# Patient Record
Sex: Female | Born: 1995 | Race: Black or African American | Hispanic: No | Marital: Single | Smoking: Former smoker
Health system: Southern US, Community
[De-identification: ages and names within clinical notes are randomized; demographics above are authoritative.]

## PROBLEM LIST (undated history)

## (undated) ENCOUNTER — Inpatient Hospital Stay (HOSPITAL_COMMUNITY): Payer: Self-pay

## (undated) DIAGNOSIS — F32A Depression, unspecified: Secondary | ICD-10-CM

## (undated) DIAGNOSIS — F909 Attention-deficit hyperactivity disorder, unspecified type: Secondary | ICD-10-CM

## (undated) DIAGNOSIS — O1490 Unspecified pre-eclampsia, unspecified trimester: Secondary | ICD-10-CM

## (undated) DIAGNOSIS — Z789 Other specified health status: Secondary | ICD-10-CM

## (undated) DIAGNOSIS — F419 Anxiety disorder, unspecified: Secondary | ICD-10-CM

## (undated) DIAGNOSIS — B009 Herpesviral infection, unspecified: Secondary | ICD-10-CM

## (undated) HISTORY — DX: Attention-deficit hyperactivity disorder, unspecified type: F90.9

## (undated) HISTORY — DX: Herpesviral infection, unspecified: B00.9

## (undated) HISTORY — DX: Depression, unspecified: F32.A

## (undated) HISTORY — PX: WISDOM TOOTH EXTRACTION: SHX21

## (undated) HISTORY — DX: Anxiety disorder, unspecified: F41.9

---
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
## 2017-02-07 ENCOUNTER — Emergency Department (HOSPITAL_COMMUNITY)
Admission: EM | Admit: 2017-02-07 | Discharge: 2017-02-07 | Disposition: A | Discharge status: Home / Self Care | Payer: Medicaid Other | Attending: Emergency Medicine | Admitting: Emergency Medicine

## 2017-02-07 ENCOUNTER — Encounter (HOSPITAL_COMMUNITY): Payer: Self-pay | Admitting: Emergency Medicine

## 2017-02-07 DIAGNOSIS — R112 Nausea with vomiting, unspecified: Secondary | ICD-10-CM | POA: Diagnosis present

## 2017-02-07 DIAGNOSIS — R197 Diarrhea, unspecified: Secondary | ICD-10-CM | POA: Diagnosis not present

## 2017-02-07 DIAGNOSIS — R1033 Periumbilical pain: Secondary | ICD-10-CM | POA: Insufficient documentation

## 2017-02-07 DIAGNOSIS — Z79899 Other long term (current) drug therapy: Secondary | ICD-10-CM | POA: Insufficient documentation

## 2017-02-07 LAB — CBC WITH DIFFERENTIAL/PLATELET
BASOS ABS: 0 10*3/uL (ref 0.0–0.1)
BASOS PCT: 0 %
Eosinophils Absolute: 0.1 10*3/uL (ref 0.0–0.7)
Eosinophils Relative: 1 %
HCT: 39.5 % (ref 36.0–46.0)
HEMOGLOBIN: 12.8 g/dL (ref 12.0–15.0)
LYMPHS ABS: 1.8 10*3/uL (ref 0.7–4.0)
Lymphocytes Relative: 21 %
MCH: 28.7 pg (ref 26.0–34.0)
MCHC: 32.4 g/dL (ref 30.0–36.0)
MCV: 88.6 fL (ref 78.0–100.0)
Monocytes Absolute: 0.4 10*3/uL (ref 0.1–1.0)
Monocytes Relative: 5 %
NEUTROS PCT: 73 %
Neutro Abs: 6.2 10*3/uL (ref 1.7–7.7)
Platelets: 436 10*3/uL — ABNORMAL HIGH (ref 150–400)
RBC: 4.46 MIL/uL (ref 3.87–5.11)
RDW: 13.6 % (ref 11.5–15.5)
WBC: 8.5 10*3/uL (ref 4.0–10.5)

## 2017-02-07 LAB — URINALYSIS, ROUTINE W REFLEX MICROSCOPIC
BILIRUBIN URINE: NEGATIVE
Glucose, UA: NEGATIVE mg/dL
HGB URINE DIPSTICK: NEGATIVE
Ketones, ur: NEGATIVE mg/dL
Leukocytes, UA: NEGATIVE
Nitrite: NEGATIVE
PROTEIN: NEGATIVE mg/dL
Specific Gravity, Urine: 1.025 (ref 1.005–1.030)
pH: 6.5 (ref 5.0–8.0)

## 2017-02-07 LAB — COMPREHENSIVE METABOLIC PANEL
ALBUMIN: 4.1 g/dL (ref 3.5–5.0)
ALK PHOS: 79 U/L (ref 38–126)
ALT: 24 U/L (ref 14–54)
AST: 21 U/L (ref 15–41)
Anion gap: 10 (ref 5–15)
BUN: 13 mg/dL (ref 6–20)
CALCIUM: 9.4 mg/dL (ref 8.9–10.3)
CHLORIDE: 103 mmol/L (ref 101–111)
CO2: 26 mmol/L (ref 22–32)
CREATININE: 0.66 mg/dL (ref 0.44–1.00)
GFR calc Af Amer: >60 mL/min (ref >60)
GFR calc non Af Amer: >60 mL/min (ref >60)
GLUCOSE: 100 mg/dL — AB (ref 65–99)
Potassium: 3.7 mmol/L (ref 3.5–5.1)
SODIUM: 139 mmol/L (ref 135–145)
Total Bilirubin: 0.3 mg/dL (ref 0.3–1.2)
Total Protein: 8.7 g/dL — ABNORMAL HIGH (ref 6.5–8.1)

## 2017-02-07 LAB — LIPASE, BLOOD: LIPASE: 23 U/L (ref 11–51)

## 2017-02-07 LAB — PREGNANCY, URINE: PREG TEST UR: NEGATIVE

## 2017-02-07 MED ORDER — ONDANSETRON 8 MG PO TBDP: 8.0000 mg | ORAL_TABLET | Freq: Three times a day (TID) | ORAL | 0 refills | Status: DC | PRN

## 2017-02-07 MED ORDER — SODIUM CHLORIDE 0.9 % IV BOLUS (SEPSIS)
1000.0000 mL | Freq: Once | INTRAVENOUS | Status: AC
  Administered 2017-02-07: 1000 mL via INTRAVENOUS

## 2017-02-07 MED ORDER — ONDANSETRON HCL 4 MG/2ML IJ SOLN
4.0000 mg | Freq: Once | INTRAMUSCULAR | Status: AC
  Administered 2017-02-07: 4 mg via INTRAVENOUS
  Filled 2017-02-07: qty 2

## 2017-02-07 NOTE — ED Triage Notes (Signed)
Onset yesterday abdominal pain,  N/V/D continues today

## 2017-02-07 NOTE — Discharge Instructions (Signed)
As discussed I suspect you may have a case of food poisoning.  Rest, make sure you are drinking plenty of fluids.  Consider the b.r.a.t diet until feeling better (bananas, rice, applesauce, toast).  Take zofran if needed for continued nausea or vomiting.  I would let the diarrhea run its course since it seems to be slowing down (don't take any diarrhea medicine).

## 2017-02-07 NOTE — ED Provider Notes (Signed)
[facility]-EMERGENCY DEPT Provider Note   CSN: [id number] Arrival date & time: 02/07/17  0859     History   Chief Complaint Chief Complaint  Patient presents with  . Emesis    HPI Donna Conrad is a [age] y.o. female presenting with a 3 hour history of nausea, vomiting and diarrhea.  She reports back to back episodes of vomiting, approximately 6 prior to arrival and 2 since here and almost the same amount of diarrhea, nonbloody.  She denies fevers or chills, does have periumbilical cramping pain with improves briefly between vomiting episodes.  She has had no sick contacts but ate a [employer] yesterday, food tasted good, but then reheated the leftovers for supper last night at which time it tasted funny.  She has had no treatment prior to arrival.  The history is provided by the patient.    Past Medical History:  Diagnosis Date  . Medical history non-contributory   . Pre-eclampsia     Patient Active Problem List   Diagnosis Date Noted  . [redacted] weeks gestation of pregnancy   . Preeclampsia 11/17/2014    Past Surgical History:  Procedure Laterality Date  . CESAREAN SECTION N/A 11/19/2014   Procedure: CESAREAN SECTION;  Surgeon: Sherian ReinJody Bovard-Stuckert, MD;  Location: [facility];  Service: Obstetrics;  Laterality: N/A;  . WISDOM TOOTH EXTRACTION      OB History    Gravida Para Term Preterm AB Living   1 1   1   1    SAB TAB Ectopic Multiple Live Births         0 1       Home Medications    Prior to Admission medications   Medication Sig Start Date End Date Taking? Authorizing Provider  etonogestrel (NEXPLANON) 68 MG IMPL implant Nexplanon 68 mg subdermal implant  Inject 1 implant by subcutaneous route.   Yes [provider]  valACYclovir (VALTREX) 500 MG tablet Take 1 tablet by mouth 2 (two) times daily as needed (outbreaks).  02/01/17  Yes [provider]  ibuprofen (ADVIL,MOTRIN) 600 MG tablet Take 1 tablet (600 mg total) by mouth every 6 (six) hours as  needed. Patient not taking: Reported on 02/07/2017 10/23/16   Burgess AmorIdol, Danelle Curiale, PA-C  ondansetron (ZOFRAN ODT) 8 MG disintegrating tablet Take 1 tablet (8 mg total) by mouth every 8 (eight) hours as needed for nausea or vomiting. 02/07/17   Burgess AmorIdol, Timur Nibert, PA-C    Family History Family History  Problem Relation Age of Onset  . Asthma Mother   . Diabetes Mother   . Hypertension Mother   . Miscarriages / [place] Mother   . Stroke Mother   . Asthma Father   . Depression Father   . Stroke Father   . Diabetes Sister   . Mental illness Sister   . Miscarriages / Stillbirths Sister   . Diabetes Brother     Social History Social History  Substance Use Topics  . Smoking status: Never Smoker  . Smokeless tobacco: Never Used  . Alcohol use No     Allergies   Patient has no known allergies.   Review of Systems Review of Systems  Constitutional: Negative for fever.  HENT: Negative for congestion and sore throat.   Eyes: Negative.   Respiratory: Negative for chest tightness and shortness of breath.   Cardiovascular: Negative for chest pain.  Gastrointestinal: Positive for abdominal pain, diarrhea, nausea and vomiting.  Genitourinary: Negative.   Musculoskeletal: Negative for arthralgias, joint swelling and neck pain.  Skin: Negative.  Negative for rash and wound.  Neurological: Negative for dizziness, weakness, light-headedness, numbness and headaches.  Psychiatric/Behavioral: Negative.      Physical Exam Updated Vital Signs BP 121/82   Pulse 67   Temp 97.8 F (36.6 C) (Oral)   Resp 16   Ht 5\' 3"  (1.6 m)   Wt 79.8 kg (176 lb)   SpO2 100%   BMI 31.18 kg/m   Physical Exam  Constitutional: She appears well-developed and well-nourished.  HENT:  Head: Normocephalic and atraumatic.  Mouth/Throat: Mucous membranes are normal. Mucous membranes are not dry.  Eyes: Conjunctivae are normal.  Neck: Normal range of motion.  Cardiovascular: Normal rate, regular rhythm, normal heart  sounds and intact distal pulses.   Pulmonary/Chest: Effort normal and breath sounds normal. She has no wheezes.  Abdominal: Soft. Bowel sounds are normal. There is tenderness in the periumbilical area. There is no rigidity and no guarding.  Musculoskeletal: Normal range of motion.  Neurological: She is alert.  Skin: Skin is warm and dry.  Psychiatric: She has a normal mood and affect.  Nursing note and vitals reviewed.    ED Treatments / Results  Labs (all labs ordered are listed, but only abnormal results are displayed) Labs Reviewed  CBC WITH DIFFERENTIAL/PLATELET - Abnormal; Notable for the following:       Result Value   Platelets 436 (*)    All other components within normal limits  COMPREHENSIVE METABOLIC PANEL - Abnormal; Notable for the following:    Glucose, Bld 100 (*)    Total Protein 8.7 (*)    All other components within normal limits  URINALYSIS, ROUTINE W REFLEX MICROSCOPIC - Abnormal; Notable for the following:    APPearance HAZY (*)    All other components within normal limits  LIPASE, BLOOD  PREGNANCY, URINE    EKG  EKG Interpretation None       Radiology No results found.  Procedures Procedures (including critical care time)  Medications Ordered in ED Medications  ondansetron (ZOFRAN) injection 4 mg (4 mg Intravenous Given 02/07/17 1033)  sodium chloride 0.9 % bolus 1,000 mL (0 mLs Intravenous Stopped 02/07/17 1149)     Initial Impression / Assessment and Plan / ED Course  I have reviewed the triage vital signs and the nursing notes.  Pertinent labs & imaging results that were available during my care of the patient were reviewed by me and considered in my medical decision making (see chart for details).     Patient felt improved after IV fluids and Zofran given.  Her diarrhea has greatly slowed up since first arrival in the ED.  Suspect probable food poisoning.  Discussed Genelle Bal diet, Zofran prescribed, increase fluid intake.  Follow-up for any  worsening symptoms.  Final Clinical Impressions(s) / ED Diagnoses   Final diagnoses:  Nausea vomiting and diarrhea    New Prescriptions New Prescriptions   ONDANSETRON (ZOFRAN ODT) 8 MG DISINTEGRATING TABLET    Take 1 tablet (8 mg total) by mouth every 8 (eight) hours as needed for nausea or vomiting.     Burgess Amor, PA-C 02/07/17 1232    Bethann Berkshire, MD 02/08/17 [phone]

## 2017-11-25 ENCOUNTER — Encounter (HOSPITAL_COMMUNITY): Payer: Self-pay | Admitting: Emergency Medicine

## 2017-11-25 ENCOUNTER — Emergency Department (HOSPITAL_COMMUNITY)
Admission: EM | Admit: 2017-11-25 | Discharge: 2017-11-25 | Disposition: A | Discharge status: Home / Self Care | Payer: Medicaid Other | Attending: Emergency Medicine | Admitting: Emergency Medicine

## 2017-11-25 ENCOUNTER — Other Ambulatory Visit: Payer: Self-pay

## 2017-11-25 DIAGNOSIS — Z79899 Other long term (current) drug therapy: Secondary | ICD-10-CM | POA: Insufficient documentation

## 2017-11-25 DIAGNOSIS — J029 Acute pharyngitis, unspecified: Secondary | ICD-10-CM | POA: Insufficient documentation

## 2017-11-25 LAB — GROUP A STREP BY PCR: GROUP A STREP BY PCR: NOT DETECTED

## 2017-11-25 MED ORDER — FLUTICASONE PROPIONATE 50 MCG/ACT NA SUSP: 2.0000 | Freq: Every day | NASAL | 0 refills | Status: DC

## 2017-11-25 NOTE — Discharge Instructions (Addendum)
You may take Tylenol and Motrin to help with your sore throat.  You were also given a perception for Flonase to help with nasal congestion and ear fullness.  To help soothe a sore throat gargle with warm salt water.  Make salt water by dissolving  teaspoon salt in 1 cup warm water. You may also use throat lozenges and over the counter sore throat spray.  Please follow up with your primary care provider within 5-7 days for re-evaluation of your symptoms. If you do not have a primary care provider, information for a healthcare clinic has been provided for you to make arrangements for follow up care. Please return to the emergency department for any persistent fevers, worsening sore throat/hoarse voice, inability to swallow, persistent vomiting, chest pain, shortness of breath, coughing up blood, or any new or worsening symptoms.

## 2017-11-25 NOTE — ED Provider Notes (Signed)
[name] EMERGENCY DEPARTMENT Provider Note   CSN: [id number] Arrival date & time: 11/25/17  1035     History   Chief Complaint Chief Complaint  Patient presents with  . Sore Throat    HPI Donna Conrad is a 22 y.o. female.  HPI  Patient is a 22 year old female who presents the emergency department today to be evaluated for sore throat that has been intermittent for the last 2 weeks.  States that over the last several days become more constant and is getting worse.  She denies any fevers at home.  She is also reporting some left ear pain.  States it feels like it is full of fluid.  She denies any drainage.  Has had some congestion, no rhinorrhea, cough, shortness of breath abdominal pain nausea vomiting or urinary symptoms.  States that she was recently around her child who has had a cold.  No difficulty drinking liquids or eating at home.  Has not tried any interventions for her symptoms.  Denies any changes to her voice.  Past Medical History:  Diagnosis Date  . Medical history non-contributory   . Pre-eclampsia     Patient Active Problem List   Diagnosis Date Noted  . [redacted] weeks gestation of pregnancy   . Preeclampsia 11/17/2014    Past Surgical History:  Procedure Laterality Date  . CESAREAN SECTION N/A 11/19/2014   Procedure: CESAREAN SECTION;  Surgeon: Sherian Rein, MD;  Location: [facility];  Service: Obstetrics;  Laterality: N/A;  . WISDOM TOOTH EXTRACTION       OB History    Gravida  1   Para  1   Term      Preterm  1   AB      Living  1     SAB      TAB      Ectopic      Multiple  0   Live Births  1            Home Medications    Prior to Admission medications   Medication Sig Start Date End Date Taking? Authorizing Provider  etonogestrel (NEXPLANON) 68 MG IMPL implant Nexplanon 68 mg subdermal implant  Inject 1 implant by subcutaneous route.    [provider]  fluticasone (FLONASE) 50 MCG/ACT nasal spray Place 2  sprays into both nostrils daily. 11/25/17   Romey Mathieson S, PA-C  ibuprofen (ADVIL,MOTRIN) 600 MG tablet Take 1 tablet (600 mg total) by mouth every 6 (six) hours as needed. Patient not taking: Reported on 02/07/2017 10/23/16   Burgess Amor, PA-C  ondansetron (ZOFRAN ODT) 8 MG disintegrating tablet Take 1 tablet (8 mg total) by mouth every 8 (eight) hours as needed for nausea or vomiting. 02/07/17   Idol, Raynelle Fanning, PA-C  valACYclovir (VALTREX) 500 MG tablet Take 1 tablet by mouth 2 (two) times daily as needed (outbreaks).  02/01/17   [provider]    Family History Family History  Problem Relation Age of Onset  . Asthma Mother   . Diabetes Mother   . Hypertension Mother   . Miscarriages / [place] Mother   . Stroke Mother   . Asthma Father   . Depression Father   . Stroke Father   . Diabetes Sister   . Mental illness Sister   . Miscarriages / Stillbirths Sister   . Diabetes Brother     Social History Social History   Tobacco Use  . Smoking status: Never Smoker  . Smokeless tobacco:  Never Used  Substance Use Topics  . Alcohol use: No  . Drug use: Not Currently     Allergies   Patient has no known allergies.   Review of Systems Review of Systems  Constitutional: Negative for chills, diaphoresis and fever.  HENT: Positive for congestion, ear pain and sore throat. Negative for postnasal drip, rhinorrhea, sinus pain, trouble swallowing and voice change.   Respiratory: Negative for cough and shortness of breath.   Cardiovascular: Negative for chest pain.  Gastrointestinal: Negative for abdominal pain, constipation, diarrhea, nausea and vomiting.  Genitourinary: Negative for dysuria and urgency.  Musculoskeletal: Negative for myalgias.  Skin: Negative for wound.  Neurological: Negative for headaches.   Physical Exam Updated Vital Signs BP 124/90 (BP Location: Right Arm)   Pulse 83   Temp 98 F (36.7 C) (Temporal) Comment: Patient drank water prior to triage   Resp 16   Ht 5\' 3"  (1.6 m)   Wt 80.7 kg (178 lb)   SpO2 100%   BMI 31.53 kg/m   Physical Exam  Constitutional: She appears well-developed and well-nourished. No distress.  HENT:  Head: Normocephalic and atraumatic.  Mouth/Throat: Mucous membranes are normal.  Right TM is within normal limits.  Left TM has clear effusion behind the ear but no erythema.  No pharyngeal erythema.  Tonsils are hypertrophic but symmetric.  No tonsillar kissing.  No exudates.  Uvula is midline.  No evidence of a PTA or retropharyngeal abscess.  Tolerating secretions with patent airway normal voice.  Eyes: Pupils are equal, round, and reactive to light. Conjunctivae and EOM are normal.  Neck: Normal range of motion. Neck supple.  Cardiovascular: Normal rate, regular rhythm and normal heart sounds.  No murmur heard. Pulmonary/Chest: Effort normal and breath sounds normal. No respiratory distress. She has no wheezes. She has no rhonchi. She has no rales.  Abdominal: Soft. Bowel sounds are normal. There is no tenderness.  Musculoskeletal: She exhibits no edema.  Lymphadenopathy:    She has cervical adenopathy.  Neurological: She is alert.  Skin: Skin is warm and dry.  Psychiatric: She has a normal mood and affect.  Nursing note and vitals reviewed.  ED Treatments / Results  Labs (all labs ordered are listed, but only abnormal results are displayed) Labs Reviewed  GROUP A STREP BY PCR    EKG None  Radiology No results found.  Procedures Procedures (including critical care time)  Medications Ordered in ED Medications - No data to display   Initial Impression / Assessment and Plan / ED Course  I have reviewed the triage vital signs and the nursing notes.  Pertinent labs & imaging results that were available during my care of the patient were reviewed by me and considered in my medical decision making (see chart for details).   Final Clinical Impressions(s) / ED Diagnoses   Final diagnoses:    Viral pharyngitis   Pt afebrile without tonsillar exudate, negative strep. Presents with mild cervical lymphadenopathy, & dysphagia; diagnosis of viral pharyngitis. No abx indicated. DC w symptomatic tx for pain  Pt does not appear dehydrated, but did discuss importance of water rehydration. Presentation non concerning for PTA or infxn spread to soft tissue. No trismus or uvula deviation.  Also complaining of left ear fullness which I suspect is due to eustachian tube dysfunction.  No evidence of OM, OE, or malignant OE. Will give Flonase to help with nasal congestion.  Recommended PCP follow up. Specific return precautions discussed. All questions answered.  ED Discharge Orders        Ordered    fluticasone [facility]) 50 MCG/ACT nasal spray  Daily     11/25/17 [address], PA-C 11/25/17 1209    Margarita Grizzle, MD 11/25/17 1558

## 2017-11-25 NOTE — ED Triage Notes (Signed)
Patient c/o sore throat x2 weeks, that is progressively getting worse. Unsure of any fevers. Patient also reports left ear pain. Denies any drainage.

## 2018-08-13 ENCOUNTER — Other Ambulatory Visit: Payer: Self-pay

## 2018-08-13 ENCOUNTER — Emergency Department (HOSPITAL_COMMUNITY)
Admission: EM | Admit: 2018-08-13 | Discharge: 2018-08-13 | Disposition: A | Discharge status: Home / Self Care | Payer: Self-pay

## 2018-08-14 ENCOUNTER — Other Ambulatory Visit: Payer: Self-pay

## 2018-08-14 ENCOUNTER — Emergency Department (HOSPITAL_COMMUNITY)
Admission: EM | Admit: 2018-08-14 | Discharge: 2018-08-14 | Disposition: A | Discharge status: Home / Self Care | Payer: Self-pay | Attending: Emergency Medicine | Admitting: Emergency Medicine

## 2018-08-14 ENCOUNTER — Encounter (HOSPITAL_COMMUNITY): Payer: Self-pay

## 2018-08-14 DIAGNOSIS — Z79899 Other long term (current) drug therapy: Secondary | ICD-10-CM | POA: Insufficient documentation

## 2018-08-14 DIAGNOSIS — M545 Low back pain: Secondary | ICD-10-CM | POA: Insufficient documentation

## 2018-08-14 DIAGNOSIS — M5442 Lumbago with sciatica, left side: Secondary | ICD-10-CM

## 2018-08-14 MED ORDER — PREDNISONE 20 MG PO TABS: ORAL_TABLET | ORAL | 0 refills | Status: DC

## 2018-08-14 MED ORDER — DEXAMETHASONE SODIUM PHOSPHATE 10 MG/ML IJ SOLN
10.0000 mg | Freq: Once | INTRAMUSCULAR | Status: AC
  Administered 2018-08-14: 10 mg via INTRAMUSCULAR
  Filled 2018-08-14: qty 1

## 2018-08-14 MED ORDER — CYCLOBENZAPRINE HCL 5 MG PO TABS: 5.0000 mg | ORAL_TABLET | Freq: Three times a day (TID) | ORAL | 0 refills | Status: DC | PRN

## 2018-08-14 MED ORDER — KETOROLAC TROMETHAMINE 15 MG/ML IJ SOLN
INTRAMUSCULAR | Status: AC
  Filled 2018-08-14: qty 2

## 2018-08-14 MED ORDER — KETOROLAC TROMETHAMINE 30 MG/ML IJ SOLN
30.0000 mg | Freq: Once | INTRAMUSCULAR | Status: AC
  Administered 2018-08-14: 30 mg via INTRAMUSCULAR

## 2018-08-14 NOTE — ED Triage Notes (Signed)
Pt reports lower back pain that started about a week ago, denies injury, no other symptoms reported. Pt ambulatory with normal gait.  Pt says she wants xray to make sure everything is ok

## 2018-08-14 NOTE — ED Provider Notes (Signed)
[facility] EMERGENCY DEPARTMENT Provider Note   CSN: [id number] Arrival date & time: 08/14/18  0353  Time seen 6:20 AM  History   Chief Complaint Chief Complaint  Patient presents with  . Back Pain    HPI Donna Conrad is a 23 y.o. female.     HPI patient states she has had low back pain for about a week.  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  She has been doing this job for about 2 months.  She states the pain is been there constantly and is sharp.  It is worse with change of positions.  She denies any numbness or tingling of her extremities.  She states the pain does radiate down into her left buttock.  She states she has had this before.  She states as a child she had low back pain and was told she was having growing pains.  She was diagnosed with sciatic nerve problems about 2 years ago.  She has never gone to a doctor's office to be evaluated for this, she just goes to the emergency department.  She states she took 2 Aleve this morning and 2 Aleve this evening without relief.  She has used icy hot and Biofreeze without improvement.  He denies any urinary or rectal incontinence.  PCP Patient, No Pcp Per   Past Medical History:  Diagnosis Date  . Medical history non-contributory   . Pre-eclampsia     Patient Active Problem List   Diagnosis Date Noted  . [redacted] weeks gestation of pregnancy   . Preeclampsia 11/17/2014    Past Surgical History:  Procedure Laterality Date  . CESAREAN SECTION N/A 11/19/2014   Procedure: CESAREAN SECTION;  Surgeon: Sherian Rein, MD;  Location: [facility];  Service: Obstetrics;  Laterality: N/A;  . WISDOM TOOTH EXTRACTION       OB History    Gravida  1   Para  1   Term      Preterm  1   AB      Living  1     SAB      TAB      Ectopic      Multiple    0   Live Births  1            Home Medications    Prior to Admission medications   Medication Sig Start Date End Date Taking? Authorizing Provider  cyclobenzaprine (FLEXERIL) 5 MG tablet Take 1 tablet (5 mg total) by mouth 3 (three) times daily as needed. 08/14/18   Devoria Albe, MD  etonogestrel (NEXPLANON) 68 MG IMPL implant Nexplanon 68 mg subdermal implant  Inject 1 implant by subcutaneous route.    [provider]  fluticasone (FLONASE) 50 MCG/ACT nasal spray Place 2 sprays into both nostrils daily. 11/25/17   Couture, Cortni S, PA-C  ibuprofen (ADVIL,MOTRIN) 600 MG tablet Take 1 tablet (600 mg total) by mouth every 6 (six) hours as needed. Patient not taking: Reported on 02/07/2017 10/23/16   Burgess Amor, PA-C  ondansetron (ZOFRAN ODT) 8 MG disintegrating tablet Take 1 tablet (8 mg total) by mouth every 8 (eight) hours as needed for nausea or vomiting. 02/07/17   Idol, Raynelle Fanning, PA-C  predniSONE (DELTASONE) 20 MG tablet Take 3 po QD x 3d , then  2 po QD x 3d then 1 po QD x 3d 08/14/18   Devoria Albe, MD  valACYclovir (VALTREX) 500 MG tablet Take 1 tablet by mouth 2 (two) times daily as needed (outbreaks).  02/01/17   [provider]    Family History Family History  Problem Relation Age of Onset  . Asthma Mother   . Diabetes Mother   . Hypertension Mother   . Miscarriages / [place] Mother   . Stroke Mother   . Asthma Father   . Depression Father   . Stroke Father   . Diabetes Sister   . Mental illness Sister   . Miscarriages / Stillbirths Sister   . Diabetes Brother     Social History Social History   Tobacco Use  . Smoking status: Never Smoker  . Smokeless tobacco: Never Used  Substance Use Topics  . Alcohol use: No  . Drug use: Not Currently  employed   Allergies   Patient has no known allergies.   Review of Systems Review of Systems  All other systems reviewed and are negative.    Physical Exam Updated Vital Signs Pulse 85   Temp 98.5  F (36.9 C) (Oral)   Resp 17   Ht 5' (1.524 m)   Wt 79.8 kg   SpO2 100%   BMI 34.37 kg/m   Physical Exam Vitals signs and nursing note reviewed.  Constitutional:      Appearance: Normal appearance. She is normal weight.  HENT:     Head: Normocephalic and atraumatic.     Right Ear: External ear normal.     Left Ear: External ear normal.     Nose: Nose normal.  Eyes:     Extraocular Movements: Extraocular movements intact.     Conjunctiva/sclera: Conjunctivae normal.  Neck:     Musculoskeletal: Normal range of motion.  Cardiovascular:     Rate and Rhythm: Normal rate and regular rhythm.  Pulmonary:     Effort: Pulmonary effort is normal. No respiratory distress.  Musculoskeletal:     Comments: Patient is nontender in her thoracic and upper lumbar spine.  She starts getting painful in the lower lumbar spine but is very painful in the sacral area.  She is also painful over the left SI joint.  On range of motion of her back she does not have pain when she is leaning away from midline but when she tries to move back towards midline she has discomfort mainly on the right side.  When she leans forward she has pain in the midline lower sacral area.  Her patellar reflexes are 2+ and equal.  She has no straight leg raising on the right, she has some discomfort when she does straight leg raising on the left.  Skin:    General: Skin is warm and dry.  Neurological:     General: No focal deficit present.     Mental Status: She is alert and oriented to person, place, and time.     Cranial Nerves: No cranial nerve deficit.  Psychiatric:        Mood and Affect: Mood normal.        Behavior: Behavior normal.        Thought Content: Thought content normal.      ED Treatments / Results  Labs (all labs ordered are listed, but only abnormal results are displayed) Labs Reviewed - No data to display  EKG None  Radiology No results found.  Procedures Procedures (including critical care  time)  Medications Ordered in ED Medications  ketorolac (TORADOL) 30 MG/ML injection 30 mg (has no administration in time range)  dexamethasone (DECADRON) injection 10 mg (has no administration in time range)     Initial Impression / Assessment and Plan / ED Course  I have reviewed the triage vital signs and the nursing notes.  Pertinent labs & imaging results that were available during my care of the patient were reviewed by me and considered in my medical decision making (see chart for details).       Patient was questioning about getting an x-ray, at this point I do not feel like an x-ray is indicated, she has had no recent injury.  I also discussed with her about getting a primary care doctor or going to see a back specialist and she is interested.  Patient drove herself to the ED.  She was given Toradol and Decadron IM.  Final Clinical Impressions(s) / ED Diagnoses   Final diagnoses:  Acute left-sided low back pain with left-sided sciatica    ED Discharge Orders         Ordered    predniSONE (DELTASONE) 20 MG tablet     08/14/18 0550    cyclobenzaprine (FLEXERIL) 5 MG tablet  3 times daily PRN     08/14/18 0550         Plan discharge  Devoria Albe, MD, Concha Pyo, MD 08/14/18 [phone]

## 2018-08-14 NOTE — Discharge Instructions (Addendum)
Use ice and heat for comfort.  Take the medications as prescribed.  You can also take acetaminophen 650 mg every 6 hours for pain.  When the prednisone is gone you can take ibuprofen 600 mg 4 times a day for pain.  Please try to get a primary care doctor to manage her pain or consider seeing a back specialist.  I gave you the phone number of to back specialist you can try.

## 2019-02-20 ENCOUNTER — Other Ambulatory Visit: Payer: Self-pay

## 2019-02-20 DIAGNOSIS — Z20822 Contact with and (suspected) exposure to covid-19: Secondary | ICD-10-CM

## 2019-02-22 LAB — NOVEL CORONAVIRUS, NAA: SARS-CoV-2, NAA: NOT DETECTED

## 2019-03-08 ENCOUNTER — Emergency Department (HOSPITAL_COMMUNITY)
Admission: EM | Admit: 2019-03-08 | Discharge: 2019-03-08 | Disposition: A | Discharge status: Home / Self Care | Payer: Self-pay | Attending: Emergency Medicine | Admitting: Emergency Medicine

## 2019-03-08 ENCOUNTER — Encounter (HOSPITAL_COMMUNITY): Payer: Self-pay

## 2019-03-08 ENCOUNTER — Emergency Department (HOSPITAL_COMMUNITY): Payer: Self-pay

## 2019-03-08 ENCOUNTER — Other Ambulatory Visit: Payer: Self-pay

## 2019-03-08 DIAGNOSIS — R0789 Other chest pain: Secondary | ICD-10-CM | POA: Insufficient documentation

## 2019-03-08 DIAGNOSIS — R519 Headache, unspecified: Secondary | ICD-10-CM | POA: Insufficient documentation

## 2019-03-08 LAB — CBC WITH DIFFERENTIAL/PLATELET
Abs Immature Granulocytes: 0.04 10*3/uL (ref 0.00–0.07)
Basophils Absolute: 0 10*3/uL (ref 0.0–0.1)
Basophils Relative: 0 %
Eosinophils Absolute: 0 10*3/uL (ref 0.0–0.5)
Eosinophils Relative: 0 %
HCT: 36.1 % (ref 36.0–46.0)
Hemoglobin: 12 g/dL (ref 12.0–15.0)
Immature Granulocytes: 1 %
Lymphocytes Relative: 31 %
Lymphs Abs: 2.1 10*3/uL (ref 0.7–4.0)
MCH: 29.6 pg (ref 26.0–34.0)
MCHC: 33.2 g/dL (ref 30.0–36.0)
MCV: 88.9 fL (ref 80.0–100.0)
Monocytes Absolute: 0.6 10*3/uL (ref 0.1–1.0)
Monocytes Relative: 10 %
Neutro Abs: 3.9 10*3/uL (ref 1.7–7.7)
Neutrophils Relative %: 58 %
Platelets: 307 10*3/uL (ref 150–400)
RBC: 4.06 MIL/uL (ref 3.87–5.11)
RDW: 13.2 % (ref 11.5–15.5)
WBC: 6.7 10*3/uL (ref 4.0–10.5)
nRBC: 0 % (ref 0.0–0.2)

## 2019-03-08 LAB — TROPONIN I (HIGH SENSITIVITY)
Troponin I (High Sensitivity): <2 ng/L (ref <18)
Troponin I (High Sensitivity): <2 ng/L (ref <18)

## 2019-03-08 LAB — BASIC METABOLIC PANEL
Anion gap: 7 (ref 5–15)
BUN: 8 mg/dL (ref 6–20)
CO2: 25 mmol/L (ref 22–32)
Calcium: 8.7 mg/dL — ABNORMAL LOW (ref 8.9–10.3)
Chloride: 107 mmol/L (ref 98–111)
Creatinine, Ser: 0.51 mg/dL (ref 0.44–1.00)
GFR calc Af Amer: >60 mL/min (ref >60)
GFR calc non Af Amer: >60 mL/min (ref >60)
Glucose, Bld: 92 mg/dL (ref 70–99)
Potassium: 3.4 mmol/L — ABNORMAL LOW (ref 3.5–5.1)
Sodium: 139 mmol/L (ref 135–145)

## 2019-03-08 LAB — D-DIMER, QUANTITATIVE: D-Dimer, Quant: <0.27 ug/mL-FEU (ref 0.00–0.50)

## 2019-03-08 MED ORDER — NAPROXEN 500 MG PO TABS: 500.0000 mg | ORAL_TABLET | Freq: Two times a day (BID) | ORAL | 0 refills | Status: DC

## 2019-03-08 MED ORDER — ASPIRIN 81 MG PO CHEW
324.0000 mg | CHEWABLE_TABLET | Freq: Once | ORAL | Status: AC
  Administered 2019-03-08: 02:00:00 324 mg via ORAL
  Filled 2019-03-08: qty 4

## 2019-03-08 NOTE — ED Notes (Signed)
ED Provider at bedside. 

## 2019-03-08 NOTE — Discharge Instructions (Signed)
There is no evidence of heart attack or blood clot pneumonia.  Take anti-inflammatories prescribed.  Follow-up with your gynecologist for removal of your Nexplanon implant though this is likely not contributing to your pain. Return to the ED if your chest pain becomes exertional, associated shortness of breath, vomiting, sweating or any other concerns.

## 2019-03-08 NOTE — ED Provider Notes (Signed)
[facility] EMERGENCY DEPARTMENT Provider Note   CSN: [id number] Arrival date & time: 03/08/19  [id number]     History   Chief Complaint Chief Complaint  Patient presents with  . Chest Pain    HPI Donna RidgelBrandi C Chiusano is a 23 y.o. female.     Patient presents with 2-day history of intermittent left-sided chest pain that lasts for several minutes at a time.  She reports the pain comes and goes and feels pain and pressure in the left side of her chest that radiates into her neck and her left arm.  She thinks the pain lasts for several seconds to several minutes at a time.  When the pain is there, it is worse with arm movement.  There is no pain with arm movement when the pain is not there.  She denies any shortness of breath, cough, fever, nausea, vomiting, diaphoresis.  She has a Nexplanon her left arm that has been there for 4 years.  There is no pain in the left arm currently.  She gets the chest pain that radiates into her left arm going down to her elbow.  There is no pain with deep breathing.  No abdominal pain, nausea or vomiting. No pain with urination or blood in the urine. No cardiac history.  She is a smoker.  No history of asthma. She came in tonight because she had the pain while she was driving and she became concerned.  It is gone now.  States she has had a headache for 2 weeks that has been gradual in onset and constant.  She was tested for COVID but was negative on September 17.  Denies any fevers, cough, runny nose or sore throat.  The history is provided by the patient.  Chest Pain Associated symptoms: headache and shortness of breath   Associated symptoms: no abdominal pain, no dizziness, no fever, no nausea, no palpitations, no vomiting and no weakness     Past Medical History:  Diagnosis Date  . Medical history non-contributory   . Pre-eclampsia     Patient Active Problem List   Diagnosis Date Noted  . [redacted] weeks gestation of pregnancy   . Preeclampsia 11/17/2014    Past Surgical History:  Procedure Laterality Date  . CESAREAN SECTION N/A 11/19/2014   Procedure: CESAREAN SECTION;  Surgeon: Sherian ReinJody Bovard-Stuckert, MD;  Location: [facility];  Service: Obstetrics;  Laterality: N/A;  . WISDOM TOOTH EXTRACTION       OB History    Gravida  1   Para  1   Term      Preterm  1   AB      Living  1     SAB      TAB      Ectopic      Multiple  0   Live Births  1            Home Medications    Prior to Admission medications   Medication Sig Start Date End Date Taking? Authorizing Provider  cyclobenzaprine (FLEXERIL) 5 MG tablet Take 1 tablet (5 mg total) by mouth 3 (three) times daily as needed. 08/14/18   Devoria AlbeKnapp, Iva, MD  etonogestrel (NEXPLANON) 68 MG IMPL implant Nexplanon 68 mg subdermal implant  Inject 1 implant by subcutaneous route.    [provider]  fluticasone (FLONASE) 50 MCG/ACT nasal spray Place 2 sprays into both nostrils daily. 11/25/17   Couture, Cortni S, PA-C  ibuprofen (ADVIL,MOTRIN) 600 MG tablet Take 1 tablet (  600 mg total) by mouth every 6 (six) hours as needed. Patient not taking: Reported on 02/07/2017 10/23/16   Burgess Amor, PA-C  ondansetron (ZOFRAN ODT) 8 MG disintegrating tablet Take 1 tablet (8 mg total) by mouth every 8 (eight) hours as needed for nausea or vomiting. 02/07/17   Idol, Raynelle Fanning, PA-C  predniSONE (DELTASONE) 20 MG tablet Take 3 po QD x 3d , then 2 po QD x 3d then 1 po QD x 3d 08/14/18   Devoria Albe, MD  valACYclovir (VALTREX) 500 MG tablet Take 1 tablet by mouth 2 (two) times daily as needed (outbreaks).  02/01/17   [provider]    Family History Family History  Problem Relation Age of Onset  . Asthma Mother   . Diabetes Mother   . Hypertension Mother   . Miscarriages / [place] Mother   . Stroke Mother   . Asthma Father   . Depression Father   . Stroke Father   . Diabetes Sister   . Mental illness Sister   . Miscarriages / Stillbirths Sister   . Diabetes Brother     Social  History Social History   Tobacco Use  . Smoking status: Never Smoker  . Smokeless tobacco: Never Used  Substance Use Topics  . Alcohol use: No  . Drug use: Not Currently     Allergies   Patient has no known allergies.   Review of Systems Review of Systems  Constitutional: Negative for activity change, appetite change and fever.  HENT: Negative for congestion and rhinorrhea.   Eyes: Negative for visual disturbance.  Respiratory: Positive for chest tightness and shortness of breath.   Cardiovascular: Positive for chest pain. Negative for palpitations and leg swelling.  Gastrointestinal: Negative for abdominal pain, nausea and vomiting.  Genitourinary: Negative for dysuria and hematuria.  Musculoskeletal: Positive for arthralgias and myalgias.  Skin: Negative for rash.  Neurological: Positive for headaches. Negative for dizziness and weakness.    all other systems are negative except as noted in the HPI and PMH.    Physical Exam Updated Vital Signs BP 135/87 (BP Location: Right Arm)   Pulse 71   Temp 98.5 F (36.9 C) (Oral)   Resp 15   Ht 5\' 3"  (1.6 m)   Wt 81.6 kg   SpO2 99%   BMI 31.89 kg/m   Physical Exam Vitals signs and nursing note reviewed.  Constitutional:      General: She is not in acute distress.    Appearance: She is well-developed.  HENT:     Head: Normocephalic and atraumatic.     Mouth/Throat:     Pharynx: No oropharyngeal exudate.  Eyes:     Conjunctiva/sclera: Conjunctivae normal.     Pupils: Pupils are equal, round, and reactive to light.  Neck:     Musculoskeletal: Normal range of motion and neck supple.     Comments: No meningismus. Cardiovascular:     Rate and Rhythm: Normal rate and regular rhythm.     Heart sounds: Normal heart sounds. No murmur.     Comments: Intact radial pulses and grip strengths Pulmonary:     Effort: Pulmonary effort is normal. No respiratory distress.     Breath sounds: Normal breath sounds.  Chest:      Chest wall: No tenderness.  Abdominal:     Palpations: Abdomen is soft.     Tenderness: There is no abdominal tenderness. There is no guarding or rebound.  Musculoskeletal: Normal range of motion.  General: No tenderness.     Comments: nexplanon in L arm. nontender  Skin:    General: Skin is warm.     Capillary Refill: Capillary refill takes less than 2 seconds.  Neurological:     General: No focal deficit present.     Mental Status: She is alert and oriented to person, place, and time. Mental status is at baseline.     Cranial Nerves: No cranial nerve deficit.     Motor: No abnormal muscle tone.     Coordination: Coordination normal.     Comments: No ataxia on finger to nose bilaterally. No pronator drift. 5/5 strength throughout. CN 2-12 intact.Equal grip strength. Sensation intact.   Psychiatric:        Behavior: Behavior normal.      ED Treatments / Results  Labs (all labs ordered are listed, but only abnormal results are displayed) Labs Reviewed  BASIC METABOLIC PANEL - Abnormal; Notable for the following components:      Result Value   Potassium 3.4 (*)    Calcium 8.7 (*)    All other components within normal limits  CBC WITH DIFFERENTIAL/PLATELET  D-DIMER, QUANTITATIVE (NOT AT [facility])  TROPONIN I (HIGH SENSITIVITY)  TROPONIN I (HIGH SENSITIVITY)    EKG EKG Interpretation  Date/Time:  Saturday March 08 2019 01:07:08 EDT Ventricular Rate:  75 PR Interval:    QRS Duration: 91 QT Interval:  378 QTC Calculation: 423 R Axis:   26 Text Interpretation:  Sinus rhythm ST elevation, consider inferior injury No previous ECGs available Confirmed by Ezequiel Essex [phone]) on 03/08/2019 1:17:45 AM   Radiology Dg Chest 2 View  Result Date: 03/08/2019 CLINICAL DATA:  Chest pain EXAM: CHEST - 2 VIEW COMPARISON:  November 24, 2007 FINDINGS: The heart size and mediastinal contours are within normal limits. Both lungs are clear. The visualized skeletal structures are  unremarkable. IMPRESSION: No acute cardiopulmonary process. Electronically Signed   By: Prudencio Pair M.D.   On: 03/08/2019 02:05    Procedures Procedures (including critical care time)  Medications Ordered in ED Medications  aspirin chewable tablet 324 mg (has no administration in time range)     Initial Impression / Assessment and Plan / ED Course  I have reviewed the triage vital signs and the nursing notes.  Pertinent labs & imaging results that were available during my care of the patient were reviewed by me and considered in my medical decision making (see chart for details).       2 days of intermittent left-sided chest pain that is atypical for ACS.  It is worse with arm movement when it is present.  Her EKG is sinus rhythm.  Workup is Reassuring.  Chest x-ray is negative, troponin is negative x2, d-dimer is negative.  HEART score 0.  Low suspicion for ACS or PE.  Pain with referral for ACS and appears to be somewhat positional and intermittent lasting for a few minutes at a time.  Patient reassured that her work-up is benign.  Recommend follow-up with her gynecologist to consider removal of her Implanon though this is likely not contributing to her symptoms.  Return to the ED for chest pain becomes exertional, associated shortness of breath, nausea, vomiting, diaphoresis or other concerns.  Final Clinical Impressions(s) / ED Diagnoses   Final diagnoses:  Atypical chest pain    ED Discharge Orders    None       Brihana Quickel, Annie Main, MD 03/08/19 [phone]

## 2019-03-08 NOTE — ED Triage Notes (Signed)
Left chest pain and shoots into her left arm and into her neck on the left.  Pt states she has nexplanon  In her left arm x 4 years, states was told 3 years max.  Pt worried her symptoms are r/t it being in too long.

## 2019-03-08 NOTE — ED Notes (Signed)
Patient transported to X-ray 

## 2019-03-21 ENCOUNTER — Other Ambulatory Visit: Payer: Self-pay

## 2019-03-21 DIAGNOSIS — Z20822 Contact with and (suspected) exposure to covid-19: Secondary | ICD-10-CM

## 2019-03-22 LAB — NOVEL CORONAVIRUS, NAA: SARS-CoV-2, NAA: NOT DETECTED

## 2019-11-20 ENCOUNTER — Ambulatory Visit: Payer: Self-pay | Attending: Internal Medicine

## 2019-11-20 ENCOUNTER — Ambulatory Visit: Payer: Self-pay | Admitting: *Deleted

## 2019-11-20 NOTE — Telephone Encounter (Signed)
Patient calls with feeling sluggish over the last 24-48 hours. Decreased appetite along with vomiting twice this morning.Having headaches also.  Denies CP/SOB/fever/dizziness. 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 Reviewed Care Advice including sipping on fluids hourly, advance to [employer] tonight or tomorrow when tolerating fluids. Made a Covid testing appointment at the [facility] site for 2:30p today. Quarantine until result come in. With any severe abdominal pain, continuous vomiting, decreased urine/dry mouth/dizziness seek treatment at the [facility] or ED. Stated she understood.   Reason for Disposition . Mild weakness or fatigue with acute minor illness (e.g., colds)  Answer Assessment - Initial Assessment Questions 1. DESCRIPTION: "Describe how you are feeling."     Sluggish, decreased appetite 2. SEVERITY: "How bad is it?"  "Can you stand and walk?"   - MILD - Feels weak or tired, but does not interfere with work, school or normal activities   - MODERATE - Able to stand and walk; weakness interferes with work, school, or normal activities   - SEVERE - Unable to stand or walk     mild 3. ONSET:  "When did the weakness begin?"     yesterday 4. CAUSE: "What do you think is causing the weakness?"     unsure 5. MEDICINES: "Have you recently started a new medicine or had a change in the amount of a medicine?"     no 6. OTHER SYMPTOMS: "Do you have any other symptoms?" (e.g., chest pain, fever, cough, SOB, vomiting, diarrhea, bleeding, other areas of pain)     Vomiting this morning twice 7. PREGNANCY: "Is there any chance you are pregnant?" "When was your last menstrual period?"     no  Protocols used: WEAKNESS (GENERALIZED) AND FATIGUE-A-AH

## 2020-01-02 ENCOUNTER — Other Ambulatory Visit: Payer: Self-pay

## 2020-01-02 ENCOUNTER — Encounter (HOSPITAL_COMMUNITY): Payer: Self-pay

## 2020-01-02 ENCOUNTER — Emergency Department (HOSPITAL_COMMUNITY)
Admission: EM | Admit: 2020-01-02 | Discharge: 2020-01-02 | Disposition: A | Discharge status: Home / Self Care | Payer: Self-pay | Attending: Emergency Medicine | Admitting: Emergency Medicine

## 2020-01-02 DIAGNOSIS — B379 Candidiasis, unspecified: Secondary | ICD-10-CM | POA: Insufficient documentation

## 2020-01-02 DIAGNOSIS — R1030 Lower abdominal pain, unspecified: Secondary | ICD-10-CM

## 2020-01-02 LAB — URINALYSIS, ROUTINE W REFLEX MICROSCOPIC
Bilirubin Urine: NEGATIVE
Glucose, UA: NEGATIVE mg/dL
Hgb urine dipstick: NEGATIVE
Ketones, ur: NEGATIVE mg/dL
Leukocytes,Ua: NEGATIVE
Nitrite: NEGATIVE
Protein, ur: NEGATIVE mg/dL
Specific Gravity, Urine: 1.018 (ref 1.005–1.030)
pH: 6 (ref 5.0–8.0)

## 2020-01-02 LAB — CBC WITH DIFFERENTIAL/PLATELET
Abs Immature Granulocytes: 0.02 10*3/uL (ref 0.00–0.07)
Basophils Absolute: 0 10*3/uL (ref 0.0–0.1)
Basophils Relative: 0 %
Eosinophils Absolute: 0 10*3/uL (ref 0.0–0.5)
Eosinophils Relative: 1 %
HCT: 39.9 % (ref 36.0–46.0)
Hemoglobin: 13 g/dL (ref 12.0–15.0)
Immature Granulocytes: 0 %
Lymphocytes Relative: 28 %
Lymphs Abs: 1.6 10*3/uL (ref 0.7–4.0)
MCH: 29.3 pg (ref 26.0–34.0)
MCHC: 32.6 g/dL (ref 30.0–36.0)
MCV: 90.1 fL (ref 80.0–100.0)
Monocytes Absolute: 0.6 10*3/uL (ref 0.1–1.0)
Monocytes Relative: 10 %
Neutro Abs: 3.4 10*3/uL (ref 1.7–7.7)
Neutrophils Relative %: 61 %
Platelets: 336 10*3/uL (ref 150–400)
RBC: 4.43 MIL/uL (ref 3.87–5.11)
RDW: 12.8 % (ref 11.5–15.5)
WBC: 5.6 10*3/uL (ref 4.0–10.5)
nRBC: 0 % (ref 0.0–0.2)

## 2020-01-02 LAB — WET PREP, GENITAL
Clue Cells Wet Prep HPF POC: NONE SEEN
Sperm: NONE SEEN
Trich, Wet Prep: NONE SEEN

## 2020-01-02 LAB — COMPREHENSIVE METABOLIC PANEL
ALT: 22 U/L (ref 0–44)
AST: 20 U/L (ref 15–41)
Albumin: 4.1 g/dL (ref 3.5–5.0)
Alkaline Phosphatase: 71 U/L (ref 38–126)
Anion gap: 9 (ref 5–15)
BUN: 9 mg/dL (ref 6–20)
CO2: 24 mmol/L (ref 22–32)
Calcium: 9 mg/dL (ref 8.9–10.3)
Chloride: 103 mmol/L (ref 98–111)
Creatinine, Ser: 0.67 mg/dL (ref 0.44–1.00)
GFR calc Af Amer: >60 mL/min (ref >60)
GFR calc non Af Amer: >60 mL/min (ref >60)
Glucose, Bld: 102 mg/dL — ABNORMAL HIGH (ref 70–99)
Potassium: 3.7 mmol/L (ref 3.5–5.1)
Sodium: 136 mmol/L (ref 135–145)
Total Bilirubin: 0.5 mg/dL (ref 0.3–1.2)
Total Protein: 7.9 g/dL (ref 6.5–8.1)

## 2020-01-02 LAB — LIPASE, BLOOD: Lipase: 23 U/L (ref 11–51)

## 2020-01-02 LAB — PREGNANCY, URINE: Preg Test, Ur: NEGATIVE

## 2020-01-02 MED ORDER — FLUCONAZOLE 200 MG PO TABS
200.0000 mg | ORAL_TABLET | Freq: Every day | ORAL | 0 refills | Status: DC
Start: 2020-01-02 — End: 2020-01-02

## 2020-01-02 MED ORDER — FLUCONAZOLE 200 MG PO TABS
200.0000 mg | ORAL_TABLET | Freq: Every day | ORAL | 0 refills | Status: AC
Start: 2020-01-02 — End: 2020-01-04

## 2020-01-02 NOTE — ED Provider Notes (Signed)
Emergency Department Provider Note   I have reviewed the triage vital signs and the nursing notes.   HISTORY  Chief Complaint Abdominal Pain   HPI Donna Conrad is a 24 y.o. female with past medical history reviewed below presents emergency department evaluation of lower abdominal pain starting last night.  Patient denies any vaginal bleeding or discharge.  Her pain is mainly suprapubic and radiating somewhat to the left.  She states it was initially severe but is improved significantly.  She had a prescription for Flagyl given to her during an episode of bacterial vaginosis in May.  She began taking the antibiotic last night.  She denies any fevers, chills, back pain.  No other modifying factors.  Past Medical History:  Diagnosis Date  . Medical history non-contributory   . Pre-eclampsia     Patient Active Problem List   Diagnosis Date Noted  . [redacted] weeks gestation of pregnancy   . Preeclampsia 11/17/2014    Past Surgical History:  Procedure Laterality Date  . CESAREAN SECTION N/A 11/19/2014   Procedure: CESAREAN SECTION;  Surgeon: Sherian Rein, MD;  Location: [facility];  Service: Obstetrics;  Laterality: N/A;  . WISDOM TOOTH EXTRACTION      Allergies Patient has no known allergies.  Family History  Problem Relation Age of Onset  . Asthma Mother   . Diabetes Mother   . Hypertension Mother   . Miscarriages / [place] Mother   . Stroke Mother   . Asthma Father   . Depression Father   . Stroke Father   . Diabetes Sister   . Mental illness Sister   . Miscarriages / Stillbirths Sister   . Diabetes Brother     Social History Social History   Tobacco Use  . Smoking status: Never Smoker  . Smokeless tobacco: Never Used  Vaping Use  . Vaping Use: Never used  Substance Use Topics  . Alcohol use: No  . Drug use: Not Currently    Review of Systems  Constitutional: No fever/chills Eyes: No visual changes. ENT: No sore throat. Cardiovascular:  Denies chest pain. Respiratory: Denies shortness of breath. Gastrointestinal: Positive lower/LLQ abdominal pain.  No nausea, no vomiting.  No diarrhea.  No constipation. Genitourinary: Negative for dysuria.  Musculoskeletal: Negative for back pain. Skin: Negative for rash. Neurological: Negative for headaches, focal weakness or numbness.  10-point ROS otherwise negative.  ____________________________________________   PHYSICAL EXAM:  VITAL SIGNS: ED Triage Vitals [01/02/20 0703]  Enc Vitals Group     BP (!) 139/102     Pulse Rate 80     Resp 20     Temp 98.2 F (36.8 C)     Temp Source Oral     SpO2 100 %     Weight 195 lb (88.5 kg)     Height 5\' 3"  (1.6 m)   Constitutional: Alert and oriented. Well appearing and in no acute distress. Eyes: Conjunctivae are normal. Head: Atraumatic. Neck: No stridor.  Cardiovascular: Normal rate, regular rhythm.  Respiratory: Normal respiratory effort.  Gastrointestinal: Soft and nontender. No distention.  Genitourinary: Exam performed with nurse chaperone and patient's verbal consent. Normal external genitalia. No vaginal bleeding. Physiologic discharge. No CMT or adnexal tenderness/masses.  Musculoskeletal: No gross deformities of extremities. Neurologic:  Normal speech and language.  Skin:  Skin is warm, dry and intact. No rash noted.  ____________________________________________   LABS (all labs ordered are listed, but only abnormal results are displayed)  Labs Reviewed  WET PREP, GENITAL -  Abnormal; Notable for the following components:      Result Value   Yeast Wet Prep HPF POC PRESENT (*)    WBC, Wet Prep HPF POC FEW (*)    All other components within normal limits  URINALYSIS, ROUTINE W REFLEX MICROSCOPIC - Abnormal; Notable for the following components:   APPearance CLOUDY (*)    All other components within normal limits  COMPREHENSIVE METABOLIC PANEL - Abnormal; Notable for the following components:   Glucose, Bld 102  (*)    All other components within normal limits  PREGNANCY, URINE  LIPASE, BLOOD  CBC WITH DIFFERENTIAL/PLATELET  GC/CHLAMYDIA PROBE AMP ([place]) NOT AT [facility]   ____________________________________________  RADIOLOGY  None  ____________________________________________   PROCEDURES  Procedure(s) performed:   Procedures  None  ____________________________________________   INITIAL IMPRESSION / ASSESSMENT AND PLAN / ED COURSE  Pertinent labs & imaging results that were available during my care of the patient were reviewed by me and considered in my medical decision making (see chart for details).   Patient presents to the emergency department for evaluation of lower abdominal pain starting last night which was initially severe but is improved.  No significant tenderness on abdominal exam.  She did start Flagyl last night but no clear infection source.  Plan for pelvic exam and wet prep along with GC/chlamydia.  Patient was tested for HIV and syphilis in May and so will not repeat that today.   08:50 AM  Wet prep resulting with no evidence of bacterial vaginosis but some yeast present.  Plan for fluconazole for 2 days.  Will advise the patient to continue the Flagyl which she is started in case this is masking an underlying bacterial vaginosis.  Gave contact information for local PCP and ED return precautions.  Exam and history not consistent with torsion.  No advanced abdominal imaging on an emergency basis at this time.  ____________________________________________  FINAL CLINICAL IMPRESSION(S) / ED DIAGNOSES  Final diagnoses:  Lower abdominal pain  Yeast infection    NEW OUTPATIENT MEDICATIONS STARTED DURING THIS VISIT:  New Prescriptions   FLUCONAZOLE (DIFLUCAN) 200 MG TABLET    Take 1 tablet (200 mg total) by mouth daily for 2 days.    Note:  This document was prepared using Dragon voice recognition software and may include unintentional dictation  errors.  Alona Bene, MD, [facility]    Shashank Kwasnik, Arlyss Repress, MD 01/02/20 [phone]

## 2020-01-02 NOTE — Discharge Instructions (Signed)
You were seen in the emergency room today with lower abdominal pain.  Your lab work shows some yeast infection but no other findings.  I do not see evidence of bacterial vaginosis but you have started taking Flagyl which could be masking this underlying infection.  Given that you have started this antibiotic I would complete the course as prescribed.  Please take Tylenol and or Motrin as needed for lower abdominal pain.  I have listed a primary care doctor on the paperwork here in case you do not already have one.  You can call to schedule an appointment.  Return to the emergency department any new or suddenly worsening symptoms.

## 2020-01-02 NOTE — ED Triage Notes (Signed)
Pt reports was diagnosed with bactrerial vaginosis in May.  Says was prescribed flagyl but says was told to only take it if she started having any symptoms.  Pt denies any vaginal discharge.  Reports lower abd pain and urinary frequency.  Pt started taking the flagyl yesterday.  Reports nausea, no vomiting or diarrhea.

## 2020-01-05 LAB — GC/CHLAMYDIA PROBE AMP (~~LOC~~) NOT AT ARMC
Chlamydia: NEGATIVE
Comment: NEGATIVE
Comment: NORMAL
Neisseria Gonorrhea: NEGATIVE

## 2020-02-24 ENCOUNTER — Other Ambulatory Visit: Payer: Self-pay

## 2020-02-24 ENCOUNTER — Other Ambulatory Visit: Payer: Self-pay | Admitting: Critical Care Medicine

## 2020-02-24 DIAGNOSIS — Z20822 Contact with and (suspected) exposure to covid-19: Secondary | ICD-10-CM

## 2020-02-26 LAB — SPECIMEN STATUS REPORT

## 2020-02-26 LAB — SARS-COV-2, NAA 2 DAY TAT

## 2020-02-26 LAB — NOVEL CORONAVIRUS, NAA: SARS-CoV-2, NAA: NOT DETECTED

## 2021-02-05 ENCOUNTER — Encounter (HOSPITAL_COMMUNITY): Payer: Self-pay | Admitting: *Deleted

## 2021-02-05 ENCOUNTER — Emergency Department (HOSPITAL_COMMUNITY)
Admission: EM | Admit: 2021-02-05 | Discharge: 2021-02-05 | Disposition: A | Discharge status: Home / Self Care | Payer: Medicaid Other | Attending: Emergency Medicine | Admitting: Emergency Medicine

## 2021-02-05 ENCOUNTER — Other Ambulatory Visit: Payer: Self-pay

## 2021-02-05 DIAGNOSIS — Z202 Contact with and (suspected) exposure to infections with a predominantly sexual mode of transmission: Secondary | ICD-10-CM | POA: Diagnosis present

## 2021-02-05 DIAGNOSIS — Z7689 Persons encountering health services in other specified circumstances: Secondary | ICD-10-CM

## 2021-02-05 DIAGNOSIS — I10 Essential (primary) hypertension: Secondary | ICD-10-CM

## 2021-02-05 LAB — URINALYSIS, ROUTINE W REFLEX MICROSCOPIC
Bilirubin Urine: NEGATIVE
Glucose, UA: NEGATIVE mg/dL
Hgb urine dipstick: NEGATIVE
Ketones, ur: NEGATIVE mg/dL
Leukocytes,Ua: NEGATIVE
Nitrite: NEGATIVE
Protein, ur: NEGATIVE mg/dL
Specific Gravity, Urine: 1.02 (ref 1.005–1.030)
pH: 7.5 (ref 5.0–8.0)

## 2021-02-05 LAB — WET PREP, GENITAL
Clue Cells Wet Prep HPF POC: NONE SEEN
Sperm: NONE SEEN
Trich, Wet Prep: NONE SEEN
Yeast Wet Prep HPF POC: NONE SEEN

## 2021-02-05 LAB — PREGNANCY, URINE: Preg Test, Ur: NEGATIVE

## 2021-02-05 MED ORDER — CEFTRIAXONE SODIUM 500 MG IJ SOLR
500.0000 mg | Freq: Once | INTRAMUSCULAR | Status: AC
  Administered 2021-02-05: 500 mg via INTRAMUSCULAR
  Filled 2021-02-05: qty 500

## 2021-02-05 MED ORDER — STERILE WATER FOR INJECTION IJ SOLN
INTRAMUSCULAR | Status: AC
  Filled 2021-02-05: qty 10

## 2021-02-05 MED ORDER — DOXYCYCLINE HYCLATE 100 MG PO CAPS: 100.0000 mg | ORAL_CAPSULE | Freq: Two times a day (BID) | ORAL | 0 refills | Status: DC

## 2021-02-05 NOTE — ED Triage Notes (Signed)
Pt here for STD screening, states her sexual partner is being treated for NGU.  Pt denies any symptoms.

## 2021-02-05 NOTE — ED Provider Notes (Signed)
[facility] EMERGENCY DEPARTMENT Provider Note   CSN: [id number] Arrival date & time: 02/05/21  1215     History Chief Complaint  Patient presents with   SEXUALLY TRANSMITTED DISEASE    Donna Conrad is a 25 y.o. female.  HPI      Donna Conrad is a 25 y.o. female who presents to the Emergency Department requesting evaluation for possible STI exposure.  States that her significant other notified her of a diagnosis of nongonococcal urethritis.  States last sexual encounter was early August.  She had some lower abdominal cramping after intercourse, but this has since resolved.  She denies any symptoms today.  Specifically, she denies fever, chills, dysuria, vaginal pain, vaginal bleeding or vaginal discharge.   Past Medical History:  Diagnosis Date   Medical history non-contributory    Pre-eclampsia     Patient Active Problem List   Diagnosis Date Noted   [redacted] weeks gestation of pregnancy    Preeclampsia 11/17/2014    Past Surgical History:  Procedure Laterality Date   CESAREAN SECTION N/A 11/19/2014   Procedure: CESAREAN SECTION;  Surgeon: Sherian Rein, MD;  Location: [facility];  Service: Obstetrics;  Laterality: N/A;   WISDOM TOOTH EXTRACTION       OB History     Gravida  1   Para  1   Term      Preterm  1   AB      Living  1      SAB      IAB      Ectopic      Multiple  0   Live Births  1           Family History  Problem Relation Age of Onset   Asthma Mother    Diabetes Mother    Hypertension Mother    Miscarriages / [place] Mother    Stroke Mother    Asthma Father    Depression Father    Stroke Father    Diabetes Sister    Mental illness Sister    Miscarriages / Stillbirths Sister    Diabetes Brother     Social History   Tobacco Use   Smoking status: Never   Smokeless tobacco: Never  Vaping Use   Vaping Use: Never used  Substance Use Topics   Alcohol use: No   Drug use: Not Currently    Home  Medications Prior to Admission medications   Medication Sig Start Date End Date Taking? Authorizing Provider  cyclobenzaprine (FLEXERIL) 5 MG tablet Take 1 tablet (5 mg total) by mouth 3 (three) times daily as needed. 08/14/18   Devoria Albe, MD  etonogestrel (NEXPLANON) 68 MG IMPL implant Nexplanon 68 mg subdermal implant  Inject 1 implant by subcutaneous route.    [provider]  fluticasone (FLONASE) 50 MCG/ACT nasal spray Place 2 sprays into both nostrils daily. 11/25/17   Couture, Cortni S, PA-C  ibuprofen (ADVIL,MOTRIN) 600 MG tablet Take 1 tablet (600 mg total) by mouth every 6 (six) hours as needed. Patient not taking: Reported on 02/07/2017 10/23/16   Burgess Amor, PA-C  naproxen (NAPROSYN) 500 MG tablet Take 1 tablet (500 mg total) by mouth 2 (two) times daily with a meal. 03/08/19   Rancour, Jeannett Senior, MD  ondansetron (ZOFRAN ODT) 8 MG disintegrating tablet Take 1 tablet (8 mg total) by mouth every 8 (eight) hours as needed for nausea or vomiting. 02/07/17   Idol, Raynelle Fanning, PA-C  predniSONE (DELTASONE) 20 MG tablet  Take 3 po QD x 3d , then 2 po QD x 3d then 1 po QD x 3d 08/14/18   Devoria Albe, MD  valACYclovir (VALTREX) 500 MG tablet Take 1 tablet by mouth 2 (two) times daily as needed (outbreaks).  02/01/17   [provider]    Allergies    Patient has no known allergies.  Review of Systems   Review of Systems  Constitutional:  Negative for chills, fatigue and fever.  HENT:  Negative for sore throat.   Respiratory:  Negative for cough and shortness of breath.   Cardiovascular:  Negative for chest pain.  Gastrointestinal:  Negative for abdominal pain, diarrhea, nausea and vomiting.  Genitourinary:  Negative for decreased urine volume, difficulty urinating, dysuria, flank pain, hematuria, vaginal bleeding, vaginal discharge and vaginal pain.  Musculoskeletal:  Negative for arthralgias, back pain and myalgias.  Skin:  Negative for rash.  Neurological:  Negative for dizziness,  weakness and numbness.  Hematological:  Does not bruise/bleed easily.   Physical Exam Updated Vital Signs BP (!) 156/103 (BP Location: Left Arm)   Pulse 66   Temp 98.1 F (36.7 C) (Oral)   Resp 18   Ht 5\' 3"  (1.6 m)   Wt 86.6 kg   LMP 01/14/2021   SpO2 100%   BMI 33.83 kg/m   Physical Exam Vitals and nursing note reviewed. Exam conducted with a chaperone present.  Constitutional:      General: She is not in acute distress.    Appearance: Normal appearance. She is not toxic-appearing.  HENT:     Head: Normocephalic.  Cardiovascular:     Rate and Rhythm: Normal rate and regular rhythm.     Pulses: Normal pulses.  Pulmonary:     Effort: Pulmonary effort is normal.     Breath sounds: Normal breath sounds. No wheezing.  Abdominal:     Palpations: Abdomen is soft.     Tenderness: There is no abdominal tenderness. There is no right CVA tenderness, left CVA tenderness, guarding or rebound.  Genitourinary:    Labia:        Right: No rash, tenderness or lesion.        Left: No rash, tenderness or lesion.      Cervix: Cervical motion tenderness present. No discharge, friability or cervical bleeding.     Uterus: Normal. Not tender.      Adnexa:        Right: No mass or tenderness.         Left: No mass or tenderness.       Comments: Patient with mild cervical motion tenderness.  No adnexal tenderness or palpable masses.  Small amount of thick white discharge in the vaginal vault.  No bleeding or foreign bodies. Musculoskeletal:        General: Normal range of motion.  Skin:    General: Skin is warm.     Capillary Refill: Capillary refill takes less than 2 seconds.     Findings: No rash.  Neurological:     General: No focal deficit present.     Mental Status: She is alert.     Sensory: No sensory deficit.     Motor: No weakness.    ED Results / Procedures / Treatments   Labs (all labs ordered are listed, but only abnormal results are displayed) Labs Reviewed  WET PREP,  GENITAL - Abnormal; Notable for the following components:      Result Value   WBC, Wet Prep HPF POC  RARE (*)    All other components within normal limits  URINALYSIS, ROUTINE W REFLEX MICROSCOPIC - Abnormal; Notable for the following components:   APPearance HAZY (*)    All other components within normal limits  PREGNANCY, URINE  HIV ANTIBODY (ROUTINE TESTING W REFLEX)  RPR  GC/CHLAMYDIA PROBE AMP ([place]) NOT AT [facility]    EKG None  Radiology No results found.  Procedures Procedures   Medications Ordered in ED Medications  sterile water (preservative free) injection (has no administration in time range)  cefTRIAXone (ROCEPHIN) injection 500 mg (500 mg Intramuscular Given 02/05/21 1434)    ED Course  I have reviewed the triage vital signs and the nursing notes.  Pertinent labs & imaging results that were available during my care of the patient were reviewed by me and considered in my medical decision making (see chart for details).    MDM Rules/Calculators/A&P                           Patient here for evaluation of possible STI exposure.  States that she was notified by significant other diagnosis of nongonococcal urethritis.  She denies any symptoms at this time.  On exam, patient well-appearing nontoxic.  Mildly hypertensive.  History of hypertension during pregnancy, but does not take antihypertensive medications at this time.  No associated symptoms.  I have advised of blood pressure readings today and need for close outpatient follow-up regarding her blood pressure.  She verbalized understanding agrees to plan.  Due to patient's concern for STI, will prophylactically treat with IM ceftriaxone and prescription for doxycycline.  GC chlamydia, RPR and HIV test are pending.  Patient appears appropriate for discharge home, safe sex precautions were discussed.   Final Clinical Impression(s) / ED Diagnoses Final diagnoses:  Encounter for assessment of STD exposure   Hypertension, unspecified type    Rx / DC Orders ED Discharge Orders     None        Pauline Aus, PA-C 02/05/21 1457    Vanetta Mulders, MD 02/06/21 [phone]

## 2021-02-05 NOTE — Discharge Instructions (Addendum)
Your remaining culture results are pending.  You may review the results on the MyChart app.  Results typically take 3 to 4 days.  I recommend that you take the prescribed antibiotic as directed.  Also, your blood pressure today was elevated.  This will need to be rechecked by a primary care provider.  I have listed some of the local clinics in the area for you to establish primary care.  Return to emergency department for any new or worsening symptoms.

## 2021-02-06 LAB — RPR: RPR Ser Ql: NONREACTIVE

## 2021-02-06 LAB — HIV ANTIBODY (ROUTINE TESTING W REFLEX): HIV Screen 4th Generation wRfx: NONREACTIVE

## 2021-02-08 LAB — GC/CHLAMYDIA PROBE AMP (~~LOC~~) NOT AT ARMC
Chlamydia: NEGATIVE
Comment: NEGATIVE
Comment: NORMAL
Neisseria Gonorrhea: NEGATIVE

## 2021-02-25 IMAGING — DX DG CHEST 2V
2 series · 2 of 2 positions shown · non-contrast
Comparison: November 24, 2007

CLINICAL DATA: Chest pain

EXAM:
CHEST - 2 VIEW

[chest pa]
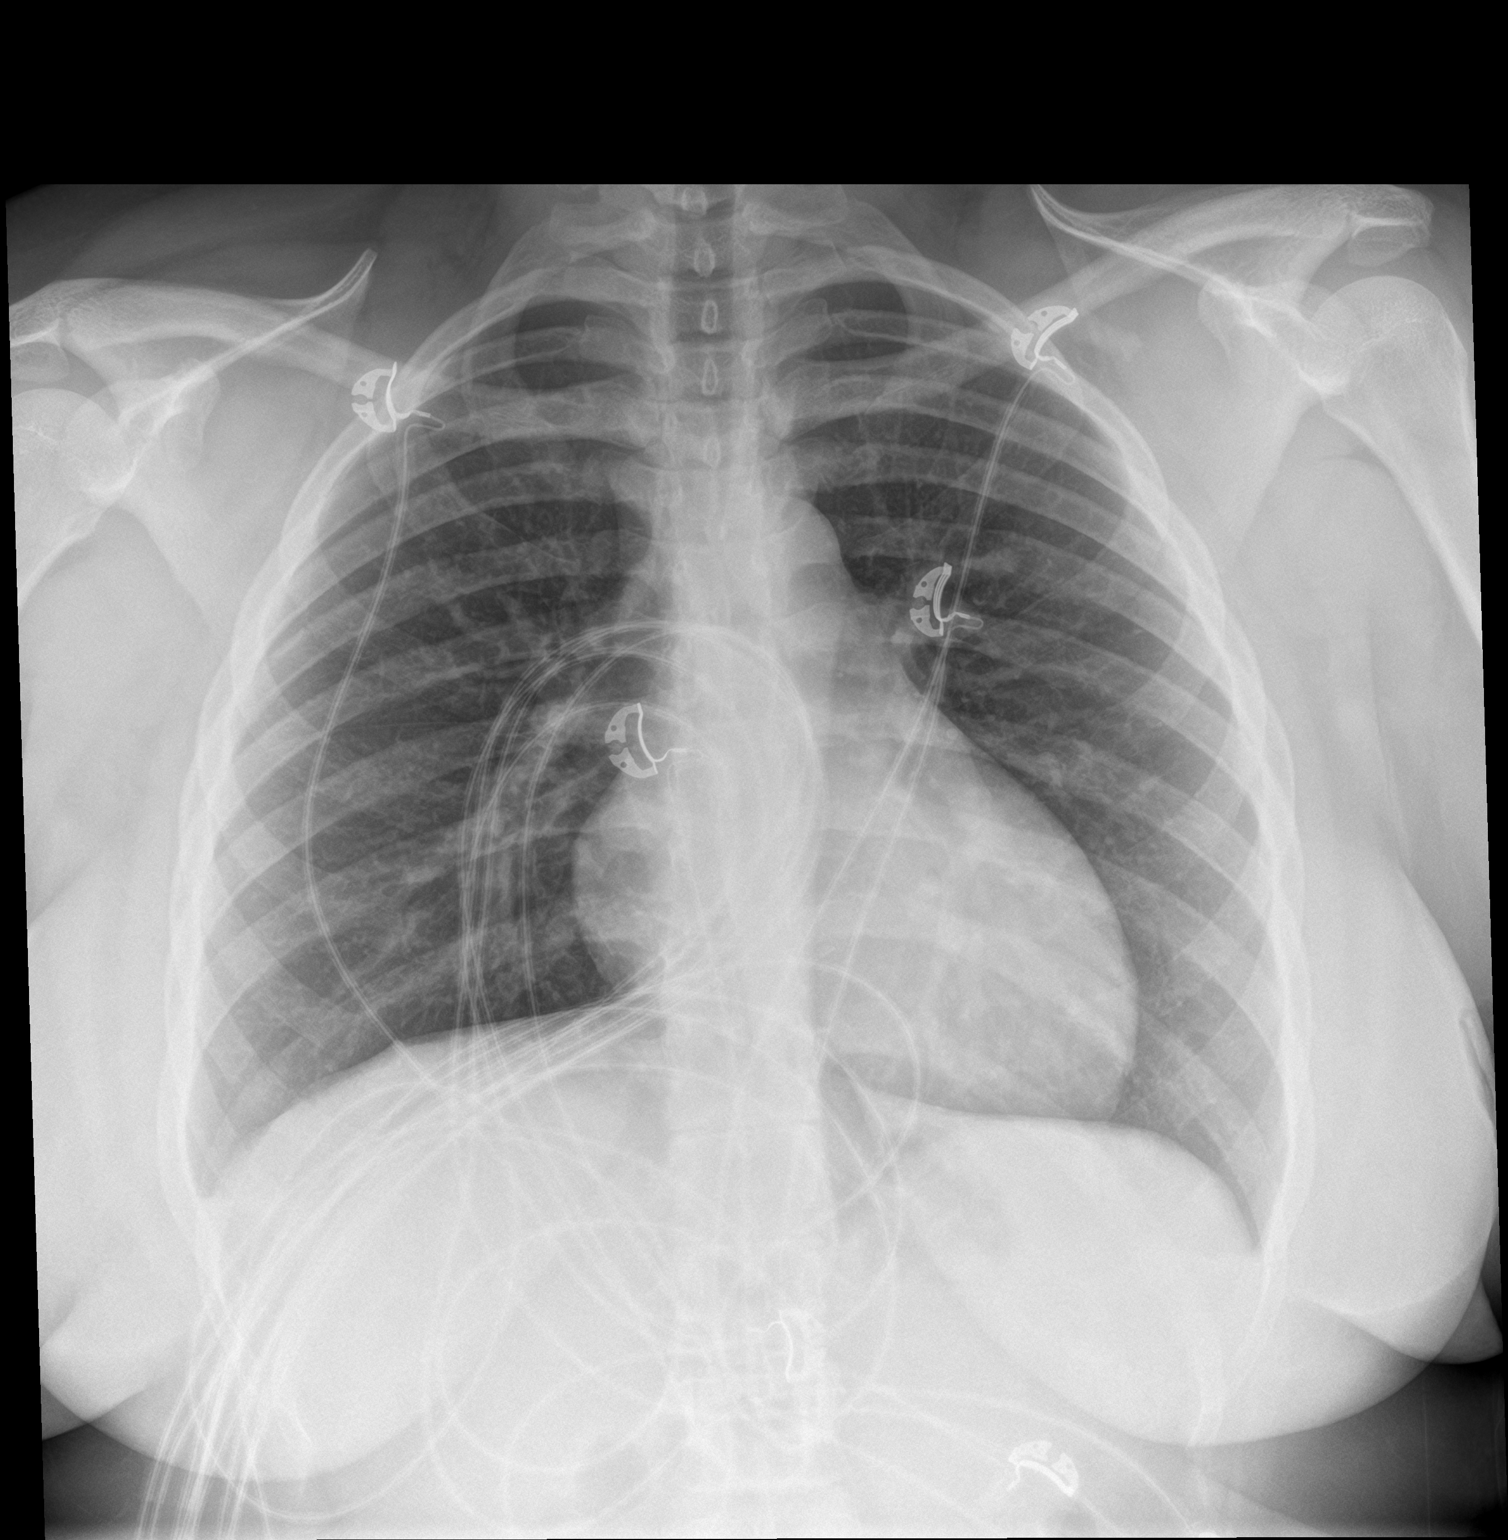

[chest lat]
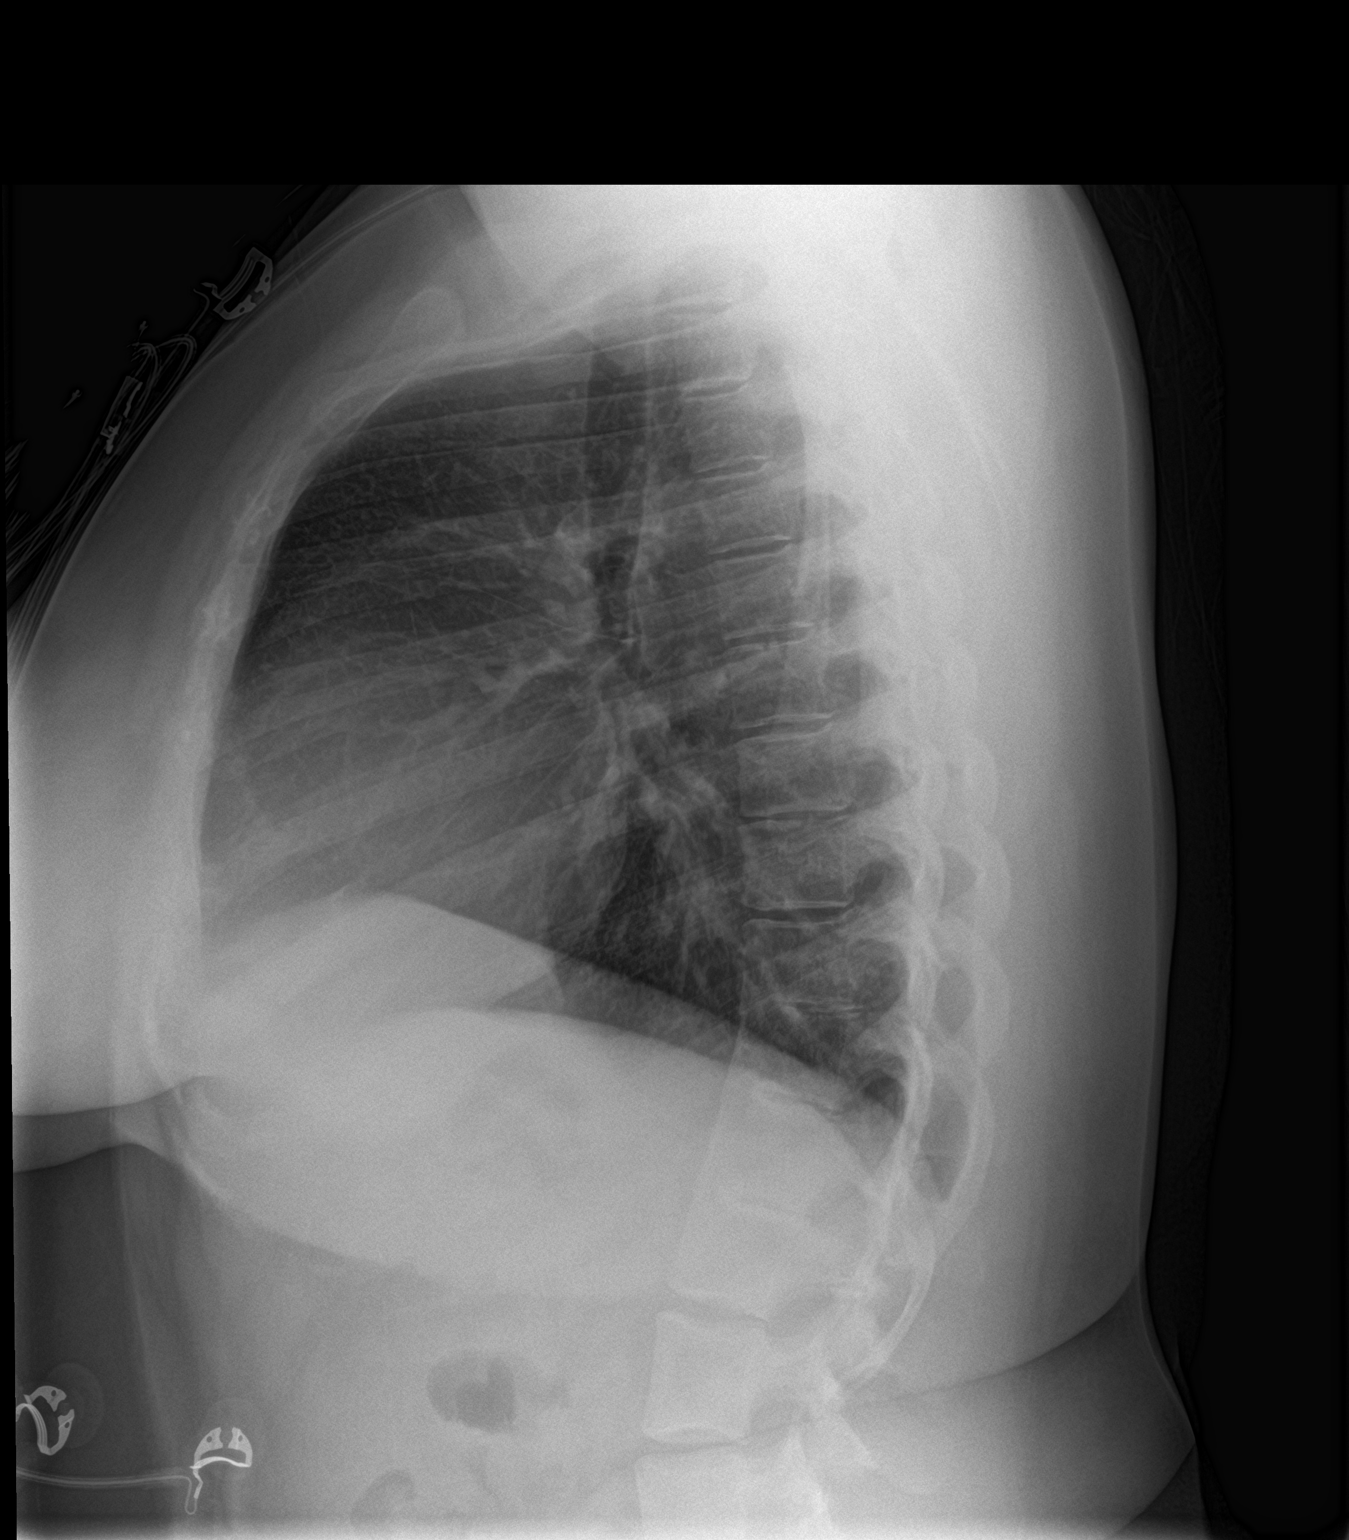

[2 of 2 positions shown; findings below may reference images not displayed]

FINDINGS: The heart size and mediastinal contours are within normal limits.
Both lungs are clear. The visualized skeletal structures are
unremarkable.
IMPRESSION: No acute cardiopulmonary process.

## 2021-03-10 ENCOUNTER — Ambulatory Visit: Payer: Self-pay | Admitting: *Deleted

## 2021-03-10 NOTE — Telephone Encounter (Signed)
Patient called c/o cramps during intercourse. Called patient to review symptoms. No answer, left message on voicemail to call back at #[phone].

## 2021-03-10 NOTE — Telephone Encounter (Signed)
States when she was having intercourse yesterday she had a really bad cramp. No pain afterwards and no bleeding She would like to talk to a nurse  Left VM for pt to call back.

## 2021-03-10 NOTE — Telephone Encounter (Signed)
Third attempt to reach pt. Extended ring. No PCP

## 2021-04-15 ENCOUNTER — Encounter (HOSPITAL_COMMUNITY): Payer: Self-pay | Admitting: Emergency Medicine

## 2021-04-15 ENCOUNTER — Other Ambulatory Visit: Payer: Self-pay

## 2021-04-15 ENCOUNTER — Emergency Department (HOSPITAL_COMMUNITY)
Admission: EM | Admit: 2021-04-15 | Discharge: 2021-04-16 | Disposition: A | Discharge status: Home / Self Care | Payer: Medicaid Other | Attending: Emergency Medicine | Admitting: Emergency Medicine

## 2021-04-15 DIAGNOSIS — R112 Nausea with vomiting, unspecified: Secondary | ICD-10-CM | POA: Insufficient documentation

## 2021-04-15 DIAGNOSIS — R109 Unspecified abdominal pain: Secondary | ICD-10-CM | POA: Diagnosis not present

## 2021-04-15 NOTE — ED Triage Notes (Signed)
Pt here for emesis since 1800. States she has been vomiting every 30 mins and is unable to keep anything down. Pt also states she started having lower abdominal pain that started around 2pm.

## 2021-04-16 LAB — URINALYSIS, ROUTINE W REFLEX MICROSCOPIC
Bacteria, UA: NONE SEEN
Bilirubin Urine: NEGATIVE
Glucose, UA: NEGATIVE mg/dL
Hgb urine dipstick: NEGATIVE
Ketones, ur: NEGATIVE mg/dL
Leukocytes,Ua: NEGATIVE
Nitrite: NEGATIVE
Protein, ur: 30 mg/dL — AB
Specific Gravity, Urine: 1.021 (ref 1.005–1.030)
pH: 5 (ref 5.0–8.0)

## 2021-04-16 LAB — CBC
HCT: 35.6 % — ABNORMAL LOW (ref 36.0–46.0)
Hemoglobin: 11.6 g/dL — ABNORMAL LOW (ref 12.0–15.0)
MCH: 28 pg (ref 26.0–34.0)
MCHC: 32.6 g/dL (ref 30.0–36.0)
MCV: 86 fL (ref 80.0–100.0)
Platelets: 321 10*3/uL (ref 150–400)
RBC: 4.14 MIL/uL (ref 3.87–5.11)
RDW: 14 % (ref 11.5–15.5)
WBC: 8.8 10*3/uL (ref 4.0–10.5)
nRBC: 0 % (ref 0.0–0.2)

## 2021-04-16 LAB — COMPREHENSIVE METABOLIC PANEL
ALT: 26 U/L (ref 0–44)
AST: 26 U/L (ref 15–41)
Albumin: 4 g/dL (ref 3.5–5.0)
Alkaline Phosphatase: 75 U/L (ref 38–126)
Anion gap: 9 (ref 5–15)
BUN: 13 mg/dL (ref 6–20)
CO2: 21 mmol/L — ABNORMAL LOW (ref 22–32)
Calcium: 8.6 mg/dL — ABNORMAL LOW (ref 8.9–10.3)
Chloride: 104 mmol/L (ref 98–111)
Creatinine, Ser: 0.56 mg/dL (ref 0.44–1.00)
GFR, Estimated: >60 mL/min (ref >60)
Glucose, Bld: 117 mg/dL — ABNORMAL HIGH (ref 70–99)
Potassium: 3.5 mmol/L (ref 3.5–5.1)
Sodium: 134 mmol/L — ABNORMAL LOW (ref 135–145)
Total Bilirubin: 0.5 mg/dL (ref 0.3–1.2)
Total Protein: 7.7 g/dL (ref 6.5–8.1)

## 2021-04-16 LAB — POC URINE PREG, ED
Preg Test, Ur: NEGATIVE
Preg Test, Ur: POSITIVE — AB

## 2021-04-16 LAB — LIPASE, BLOOD: Lipase: 22 U/L (ref 11–51)

## 2021-04-16 MED ORDER — FENTANYL CITRATE PF 50 MCG/ML IJ SOSY
50.0000 ug | PREFILLED_SYRINGE | Freq: Once | INTRAMUSCULAR | Status: AC
  Administered 2021-04-16: 50 ug via INTRAVENOUS
  Filled 2021-04-16: qty 1

## 2021-04-16 MED ORDER — DICYCLOMINE HCL 10 MG PO CAPS
10.0000 mg | ORAL_CAPSULE | Freq: Once | ORAL | Status: AC
  Administered 2021-04-16: 10 mg via ORAL
  Filled 2021-04-16: qty 1

## 2021-04-16 MED ORDER — KETOROLAC TROMETHAMINE 30 MG/ML IJ SOLN
15.0000 mg | Freq: Once | INTRAMUSCULAR | Status: AC
  Administered 2021-04-16: 15 mg via INTRAVENOUS
  Filled 2021-04-16: qty 1

## 2021-04-16 MED ORDER — ONDANSETRON 4 MG PO TBDP: ORAL_TABLET | ORAL | 0 refills | Status: DC

## 2021-04-16 MED ORDER — LACTATED RINGERS IV BOLUS
1000.0000 mL | Freq: Once | INTRAVENOUS | Status: AC
  Administered 2021-04-16: 1000 mL via INTRAVENOUS

## 2021-04-16 MED ORDER — ONDANSETRON HCL 4 MG/2ML IJ SOLN
4.0000 mg | Freq: Once | INTRAMUSCULAR | Status: AC
  Administered 2021-04-16: 4 mg via INTRAVENOUS
  Filled 2021-04-16: qty 2

## 2021-04-16 NOTE — ED Notes (Signed)
The POC pregnancy results that showed positive were an error; the results were entered wrong into the computer. New POC test ordered with a result of negative, which is correct.  Dr. Clayborne Dana notified of results

## 2021-04-16 NOTE — ED Provider Notes (Signed)
[facility] EMERGENCY DEPARTMENT Provider Note   CSN: [id number] Arrival date & time: 04/15/21  2334     History Chief Complaint  Patient presents with   Emesis    Donna Conrad is a 25 y.o. female.  Started with nausea and abdominal pain around 1400 --> improved for a little while then started back again with vomiting around 1800. Persistent since then. Had mac and cheese from [employer] bar and chips, no other food. No alcohol, drgus or sick contacts.    Emesis Severity:  Moderate Duration:  6 hours Timing:  Intermittent Quality:  Stomach contents Progression:  Unchanged Chronicity:  New Recent urination:  Normal Relieved by:  None tried Associated symptoms: abdominal pain       Past Medical History:  Diagnosis Date   Medical history non-contributory    Pre-eclampsia     Patient Active Problem List   Diagnosis Date Noted   [redacted] weeks gestation of pregnancy    Preeclampsia 11/17/2014    Past Surgical History:  Procedure Laterality Date   CESAREAN SECTION N/A 11/19/2014   Procedure: CESAREAN SECTION;  Surgeon: Janyth Contes, MD;  Location: [place] [facility];  Service: Obstetrics;  Laterality: N/A;   WISDOM TOOTH EXTRACTION       OB History     Gravida  1   Para  1   Term      Preterm  1   AB      Living  1      SAB      IAB      Ectopic      Multiple  0   Live Births  1           Family History  Problem Relation Age of Onset   Asthma Mother    Diabetes Mother    Hypertension Mother    Miscarriages / [place] Mother    Stroke Mother    Asthma Father    Depression Father    Stroke Father    Diabetes Sister    Mental illness Sister    70 / Stillbirths Sister    Diabetes Brother     Social History   Tobacco Use   Smoking status: Never   Smokeless tobacco: Never  Vaping Use   Vaping Use: Never used  Substance Use Topics   Alcohol use: No   Drug use: Not Currently    Home Medications Prior to Admission  medications   Medication Sig Start Date End Date Taking? Authorizing Provider  ondansetron (ZOFRAN ODT) 4 MG disintegrating tablet 10m ODT q4 hours prn nausea/vomit 04/16/21  Yes Eryka Dolinger, JCorene Cornea MD  cyclobenzaprine (FLEXERIL) 5 MG tablet Take 1 tablet (5 mg total) by mouth 3 (three) times daily as needed. 08/14/18   KRolland Porter MD  doxycycline (VIBRAMYCIN) 100 MG capsule Take 1 capsule (100 mg total) by mouth 2 (two) times daily. 02/05/21   Triplett, Tammy, PA-C  etonogestrel (NEXPLANON) 68 MG IMPL implant Nexplanon 68 mg subdermal implant  Inject 1 implant by subcutaneous route.    [provider]  fluticasone (FLONASE) 50 MCG/ACT nasal spray Place 2 sprays into both nostrils daily. 11/25/17   Couture, Cortni S, PA-C  naproxen (NAPROSYN) 500 MG tablet Take 1 tablet (500 mg total) by mouth 2 (two) times daily with a meal. 03/08/19   Rancour, SAnnie Main MD  predniSONE (DELTASONE) 20 MG tablet Take 3 po QD x 3d , then 2 po QD x 3d then 1 po QD x 3d 08/14/18  Rolland Porter, MD  valACYclovir (VALTREX) 500 MG tablet Take 1 tablet by mouth 2 (two) times daily as needed (outbreaks).  02/01/17   [provider]    Allergies    Patient has no known allergies.  Review of Systems   Review of Systems  Gastrointestinal:  Positive for abdominal pain and vomiting.  All other systems reviewed and are negative.  Physical Exam Updated Vital Signs BP (!) 107/95   Pulse 88   Temp 98.4 F (36.9 C) (Oral)   Resp 18   Ht _0  (1.6 m)   Wt 89.8 kg   LMP 03/24/2021 (Approximate)   SpO2 94%   BMI 35.07 kg/m   Physical Exam Vitals and nursing note reviewed.  Constitutional:      Appearance: She is well-developed.  HENT:     Head: Normocephalic and atraumatic.     Mouth/Throat:     Mouth: Mucous membranes are moist.     Pharynx: Oropharynx is clear.  Eyes:     Pupils: Pupils are equal, round, and reactive to light.  Cardiovascular:     Rate and Rhythm: Normal rate and regular rhythm.   Pulmonary:     Effort: No respiratory distress.     Breath sounds: No stridor.  Abdominal:     General: Abdomen is flat. There is no distension.     Tenderness: There is no abdominal tenderness. There is no guarding or rebound.  Musculoskeletal:        General: No swelling or tenderness. Normal range of motion.     Cervical back: Normal range of motion.  Skin:    General: Skin is warm and dry.  Neurological:     General: No focal deficit present.     Mental Status: She is alert.  Psychiatric:        Mood and Affect: Mood normal.    ED Results / Procedures / Treatments   Labs (all labs ordered are listed, but only abnormal results are displayed) Labs Reviewed  COMPREHENSIVE METABOLIC PANEL - Abnormal; Notable for the following components:      Result Value   Sodium 134 (*)    CO2 21 (*)    Glucose, Bld 117 (*)    Calcium 8.6 (*)    All other components within normal limits  CBC - Abnormal; Notable for the following components:   Hemoglobin 11.6 (*)    HCT 35.6 (*)    All other components within normal limits  URINALYSIS, ROUTINE W REFLEX MICROSCOPIC - Abnormal; Notable for the following components:   APPearance CLOUDY (*)    Protein, ur 30 (*)    All other components within normal limits  POC URINE PREG, ED - Abnormal; Notable for the following components:   Preg Test, Ur Positive (*)    All other components within normal limits  LIPASE, BLOOD  POC URINE PREG, ED    EKG None  Radiology No results found.  Procedures Procedures   Medications Ordered in ED Medications  ondansetron (ZOFRAN) injection 4 mg (4 mg Intravenous Given 04/16/21 0058)  lactated ringers bolus 1,000 mL (0 mLs Intravenous Stopped 04/16/21 0302)  fentaNYL (SUBLIMAZE) injection 50 mcg (50 mcg Intravenous Given 04/16/21 0422)  ketorolac (TORADOL) 30 MG/ML injection 15 mg (15 mg Intravenous Given 04/16/21 0422)  dicyclomine (BENTYL) capsule 10 mg (10 mg Oral Given 04/16/21 0422)    ED  Course  I have reviewed the triage vital signs and the nursing notes.  Pertinent labs & imaging  results that were available during my care of the patient were reviewed by me and considered in my medical decision making (see chart for details).    MDM Rules/Calculators/A&P                         Emesis of unclear etiology. Possibly food. Possibly viral. No diarrhea. Will treat symptomaticay and reassess.   Symptoms improved. Abdomen still benign. Stable for d/c.   Final Clinical Impression(s) / ED Diagnoses Final diagnoses:  Nausea and vomiting, unspecified vomiting type    Rx / DC Orders ED Discharge Orders          Ordered    ondansetron (ZOFRAN ODT) 4 MG disintegrating tablet        04/16/21 0528             Jayra Choyce, Corene Cornea, MD 04/16/21 [phone]

## 2021-04-17 ENCOUNTER — Encounter (HOSPITAL_COMMUNITY): Payer: Self-pay | Admitting: *Deleted

## 2021-04-17 ENCOUNTER — Emergency Department (HOSPITAL_COMMUNITY)
Admission: EM | Admit: 2021-04-17 | Discharge: 2021-04-17 | Disposition: A | Discharge status: Home / Self Care | Payer: Medicaid Other | Attending: Emergency Medicine | Admitting: Emergency Medicine

## 2021-04-17 DIAGNOSIS — R112 Nausea with vomiting, unspecified: Secondary | ICD-10-CM | POA: Diagnosis not present

## 2021-04-17 DIAGNOSIS — R197 Diarrhea, unspecified: Secondary | ICD-10-CM | POA: Insufficient documentation

## 2021-04-17 DIAGNOSIS — R0602 Shortness of breath: Secondary | ICD-10-CM | POA: Diagnosis not present

## 2021-04-17 DIAGNOSIS — R059 Cough, unspecified: Secondary | ICD-10-CM | POA: Diagnosis not present

## 2021-04-17 DIAGNOSIS — Z20822 Contact with and (suspected) exposure to covid-19: Secondary | ICD-10-CM | POA: Insufficient documentation

## 2021-04-17 LAB — COMPREHENSIVE METABOLIC PANEL
ALT: 28 U/L (ref 0–44)
AST: 26 U/L (ref 15–41)
Albumin: 3.9 g/dL (ref 3.5–5.0)
Alkaline Phosphatase: 67 U/L (ref 38–126)
Anion gap: 10 (ref 5–15)
BUN: 8 mg/dL (ref 6–20)
CO2: 23 mmol/L (ref 22–32)
Calcium: 8.1 mg/dL — ABNORMAL LOW (ref 8.9–10.3)
Chloride: 103 mmol/L (ref 98–111)
Creatinine, Ser: 0.65 mg/dL (ref 0.44–1.00)
GFR, Estimated: >60 mL/min (ref >60)
Glucose, Bld: 93 mg/dL (ref 70–99)
Potassium: 3.2 mmol/L — ABNORMAL LOW (ref 3.5–5.1)
Sodium: 136 mmol/L (ref 135–145)
Total Bilirubin: 0.2 mg/dL — ABNORMAL LOW (ref 0.3–1.2)
Total Protein: 8 g/dL (ref 6.5–8.1)

## 2021-04-17 LAB — CBC
HCT: 36.4 % (ref 36.0–46.0)
Hemoglobin: 12.2 g/dL (ref 12.0–15.0)
MCH: 28.7 pg (ref 26.0–34.0)
MCHC: 33.5 g/dL (ref 30.0–36.0)
MCV: 85.6 fL (ref 80.0–100.0)
Platelets: 353 10*3/uL (ref 150–400)
RBC: 4.25 MIL/uL (ref 3.87–5.11)
RDW: 14.2 % (ref 11.5–15.5)
WBC: 5.9 10*3/uL (ref 4.0–10.5)
nRBC: 0 % (ref 0.0–0.2)

## 2021-04-17 LAB — RESP PANEL BY RT-PCR (FLU A&B, COVID) ARPGX2
Influenza A by PCR: NEGATIVE
Influenza B by PCR: NEGATIVE
SARS Coronavirus 2 by RT PCR: NEGATIVE

## 2021-04-17 MED ORDER — PROMETHAZINE HCL 25 MG PO TABS: 25.0000 mg | ORAL_TABLET | Freq: Four times a day (QID) | ORAL | 0 refills | Status: DC | PRN

## 2021-04-17 MED ORDER — PROMETHAZINE HCL 25 MG RE SUPP: 25.0000 mg | Freq: Four times a day (QID) | RECTAL | 0 refills | Status: DC | PRN

## 2021-04-17 MED ORDER — SODIUM CHLORIDE 0.9 % IV BOLUS
1000.0000 mL | Freq: Once | INTRAVENOUS | Status: AC
  Administered 2021-04-17: 1000 mL via INTRAVENOUS

## 2021-04-17 MED ORDER — PROMETHAZINE HCL 25 MG/ML IJ SOLN
INTRAMUSCULAR | Status: AC
  Filled 2021-04-17: qty 1

## 2021-04-17 MED ORDER — SODIUM CHLORIDE 0.9 % IV SOLN
25.0000 mg | INTRAVENOUS | Status: AC
  Administered 2021-04-17: 25 mg via INTRAVENOUS
  Filled 2021-04-17: qty 1

## 2021-04-17 NOTE — ED Triage Notes (Signed)
Abdominal pain, vomiting and diarrhea for the past 3 days, seen here 2 days ago for same, states she did not get a definite diagnosis

## 2021-04-17 NOTE — Discharge Instructions (Signed)
We have given you IV fluids as well as medications to help with the nausea, you may take Phenergan either by mouth or as a suppository if you are not able to take it by mouth.  This should gradually improve over the next couple of days.  Return for worsening symptoms, you may start taking Imodium tomorrow morning if needed for diarrhea

## 2021-04-17 NOTE — ED Provider Notes (Signed)
[facility] EMERGENCY DEPARTMENT Provider Note   CSN: [id number] Arrival date & time: 04/17/21  1643     History Chief Complaint  Patient presents with   Abdominal Pain    Donna Conrad is a 25 y.o. female.   Abdominal Pain  This patient is a 25 year old female presenting with persistent nausea vomiting and diarrhea.  She reports that this started on Friday, she was seen in the emergency department and received interventions including antinausea medicines and fluids, had a work-up which was unremarkable and in fact had a pregnancy test which was negative.  The patient states that she was given Zofran but continues to vomit, she continues to have a mid abdominal cramping when she vomits but is not having it all the time.  She has had 5 watery bowel movements today which are nonbloody.  She has been taking some Pedialyte and some crackers but otherwise has not had very much to eat or drink.  Denies fevers or chills but has had some coughing and shortness of breath  Past Medical History:  Diagnosis Date   Medical history non-contributory    Pre-eclampsia     Patient Active Problem List   Diagnosis Date Noted   [redacted] weeks gestation of pregnancy    Preeclampsia 11/17/2014    Past Surgical History:  Procedure Laterality Date   CESAREAN SECTION N/A 11/19/2014   Procedure: CESAREAN SECTION;  Surgeon: Sherian Rein, MD;  Location: [facility];  Service: Obstetrics;  Laterality: N/A;   WISDOM TOOTH EXTRACTION       OB History     Gravida  1   Para  1   Term      Preterm  1   AB      Living  1      SAB      IAB      Ectopic      Multiple  0   Live Births  1           Family History  Problem Relation Age of Onset   Asthma Mother    Diabetes Mother    Hypertension Mother    Miscarriages / [place] Mother    Stroke Mother    Asthma Father    Depression Father    Stroke Father    Diabetes Sister    Mental illness Sister    Miscarriages /  Stillbirths Sister    Diabetes Brother     Social History   Tobacco Use   Smoking status: Never   Smokeless tobacco: Never  Vaping Use   Vaping Use: Never used  Substance Use Topics   Alcohol use: No   Drug use: Not Currently    Home Medications Prior to Admission medications   Medication Sig Start Date End Date Taking? Authorizing Provider  cyclobenzaprine (FLEXERIL) 5 MG tablet Take 1 tablet (5 mg total) by mouth 3 (three) times daily as needed. 08/14/18  Yes Devoria Albe, MD  doxycycline (VIBRAMYCIN) 100 MG capsule Take 1 capsule (100 mg total) by mouth 2 (two) times daily. 02/05/21  Yes Triplett, Tammy, PA-C  naproxen (NAPROSYN) 500 MG tablet Take 1 tablet (500 mg total) by mouth 2 (two) times daily with a meal. 03/08/19  Yes Rancour, Jeannett Senior, MD  promethazine (PHENERGAN) 25 MG suppository Place 1 suppository (25 mg total) rectally every 6 (six) hours as needed for nausea or vomiting. 04/17/21  Yes Eber Hong, MD  promethazine (PHENERGAN) 25 MG tablet Take 1 tablet (25 mg total)  by mouth every 6 (six) hours as needed for nausea or vomiting. 04/17/21  Yes Eber Hong, MD  etonogestrel (NEXPLANON) 68 MG IMPL implant Nexplanon 68 mg subdermal implant  Inject 1 implant by subcutaneous route. Patient not taking: Reported on 04/17/2021    [provider]  fluticasone (FLONASE) 50 MCG/ACT nasal spray Place 2 sprays into both nostrils daily. Patient not taking: No sig reported 11/25/17   Couture, Cortni S, PA-C  ondansetron (ZOFRAN ODT) 4 MG disintegrating tablet 4mg  ODT q4 hours prn nausea/vomit Patient not taking: No sig reported 04/16/21   Mesner, 13/12/22, MD  predniSONE (DELTASONE) 20 MG tablet Take 3 po QD x 3d , then 2 po QD x 3d then 1 po QD x 3d Patient not taking: No sig reported 08/14/18   10/14/18, MD  valACYclovir (VALTREX) 500 MG tablet Take 1 tablet by mouth 2 (two) times daily as needed (outbreaks).  Patient not taking: Reported on 04/17/2021 02/01/17   [provider]    Allergies    Patient has no known allergies.  Review of Systems   Review of Systems  Gastrointestinal:  Positive for abdominal pain.  All other systems reviewed and are negative.  Physical Exam Updated Vital Signs BP 103/81   Pulse 87   Temp 99 F (37.2 C) (Oral)   Resp 20   Ht 1.6 m (5\' 3" )   Wt 89.8 kg   LMP 03/24/2021 (Approximate)   SpO2 100%   BMI 35.07 kg/m   Physical Exam Vitals and nursing note reviewed.  Constitutional:      General: She is not in acute distress.    Appearance: She is well-developed.  HENT:     Head: Normocephalic and atraumatic.     Mouth/Throat:     Pharynx: No oropharyngeal exudate.  Eyes:     General: No scleral icterus.       Right eye: No discharge.        Left eye: No discharge.     Conjunctiva/sclera: Conjunctivae normal.     Pupils: Pupils are equal, round, and reactive to light.  Neck:     Thyroid: No thyromegaly.     Vascular: No JVD.  Cardiovascular:     Rate and Rhythm: Normal rate and regular rhythm.     Heart sounds: Normal heart sounds. No murmur heard.   No friction rub. No gallop.  Pulmonary:     Effort: Pulmonary effort is normal. No respiratory distress.     Breath sounds: Normal breath sounds. No wheezing or rales.  Abdominal:     General: Bowel sounds are normal. There is no distension.     Palpations: Abdomen is soft. There is no mass.     Tenderness: There is no abdominal tenderness.  Musculoskeletal:        General: No tenderness. Normal range of motion.     Cervical back: Normal range of motion and neck supple.  Lymphadenopathy:     Cervical: No cervical adenopathy.  Skin:    General: Skin is warm and dry.     Findings: No erythema or rash.  Neurological:     Mental Status: She is alert.     Coordination: Coordination normal.  Psychiatric:        Behavior: Behavior normal.    ED Results / Procedures / Treatments   Labs (all labs ordered are listed, but only abnormal results are  displayed) Labs Reviewed  COMPREHENSIVE METABOLIC PANEL - Abnormal; Notable for the following components:  Result Value   Potassium 3.2 (*)    Calcium 8.1 (*)    Total Bilirubin 0.2 (*)    All other components within normal limits  RESP PANEL BY RT-PCR (FLU A&B, COVID) ARPGX2  CBC    EKG None  Radiology No results found.  Procedures Procedures   Medications Ordered in ED Medications  sodium chloride 0.9 % bolus 1,000 mL (0 mLs Intravenous Stopped 04/17/21 1856)  promethazine (PHENERGAN) 25 mg in sodium chloride 0.9 % 50 mL IVPB (0 mg Intravenous Stopped 04/17/21 1853)    ED Course  I have reviewed the triage vital signs and the nursing notes.  Pertinent labs & imaging results that were available during my care of the patient were reviewed by me and considered in my medical decision making (see chart for details).    MDM Rules/Calculators/A&P                           The patient in fact has very little abdominal tenderness, she has a very soft minimally tender abdomen with normal vital signs.  She is afebrile has a normal blood pressure and normal heart rate.  I suspect that her symptoms are still related to a viral type illness but will check a couple of other test.  Additionally she will need COVID and flu testing as this can cause a gastrointestinal syndrome.  Verified that test was negative for pregnancy during last encounter, will try Phenergan and IV fluids as the Zofran was not very helpful.  Patient agreeable.  Of note the patient does smoke marijuana however the diarrhea component suggest that this is actually not related to a cyclic vomiting syndrome but more likely related to a viral cause.  Pt improved - stable for d/c.  Final Clinical Impression(s) / ED Diagnoses Final diagnoses:  Nausea vomiting and diarrhea    Rx / DC Orders ED Discharge Orders          Ordered    promethazine (PHENERGAN) 25 MG suppository  Every 6 hours PRN        04/17/21 2009     promethazine (PHENERGAN) 25 MG tablet  Every 6 hours PRN        04/17/21 2009             Eber Hong, MD 04/17/21 2010

## 2021-04-17 NOTE — ED Notes (Signed)
AC notified of need for Phenergan from Pharmacy.

## 2021-06-28 ENCOUNTER — Ambulatory Visit: Payer: Self-pay | Admitting: Internal Medicine

## 2021-06-30 ENCOUNTER — Ambulatory Visit: Payer: Medicaid Other | Admitting: Internal Medicine

## 2021-07-16 ENCOUNTER — Other Ambulatory Visit: Payer: Self-pay

## 2021-07-16 ENCOUNTER — Emergency Department (HOSPITAL_COMMUNITY)
Admission: EM | Admit: 2021-07-16 | Discharge: 2021-07-16 | Disposition: A | Discharge status: Home / Self Care | Payer: Medicaid Other | Attending: Emergency Medicine | Admitting: Emergency Medicine

## 2021-07-16 ENCOUNTER — Encounter (HOSPITAL_COMMUNITY): Payer: Self-pay

## 2021-07-16 DIAGNOSIS — R1030 Lower abdominal pain, unspecified: Secondary | ICD-10-CM

## 2021-07-16 DIAGNOSIS — R1032 Left lower quadrant pain: Secondary | ICD-10-CM | POA: Insufficient documentation

## 2021-07-16 LAB — BASIC METABOLIC PANEL
Anion gap: 8 (ref 5–15)
BUN: 10 mg/dL (ref 6–20)
CO2: 25 mmol/L (ref 22–32)
Calcium: 8.6 mg/dL — ABNORMAL LOW (ref 8.9–10.3)
Chloride: 106 mmol/L (ref 98–111)
Creatinine, Ser: 0.55 mg/dL (ref 0.44–1.00)
GFR, Estimated: >60 mL/min (ref >60)
Glucose, Bld: 93 mg/dL (ref 70–99)
Potassium: 3.6 mmol/L (ref 3.5–5.1)
Sodium: 139 mmol/L (ref 135–145)

## 2021-07-16 LAB — URINALYSIS, ROUTINE W REFLEX MICROSCOPIC
Bilirubin Urine: NEGATIVE
Glucose, UA: NEGATIVE mg/dL
Hgb urine dipstick: NEGATIVE
Ketones, ur: NEGATIVE mg/dL
Leukocytes,Ua: NEGATIVE
Nitrite: NEGATIVE
Protein, ur: NEGATIVE mg/dL
Specific Gravity, Urine: 1.026 (ref 1.005–1.030)
pH: 6 (ref 5.0–8.0)

## 2021-07-16 LAB — CBC
HCT: 35.8 % — ABNORMAL LOW (ref 36.0–46.0)
Hemoglobin: 11.9 g/dL — ABNORMAL LOW (ref 12.0–15.0)
MCH: 29 pg (ref 26.0–34.0)
MCHC: 33.2 g/dL (ref 30.0–36.0)
MCV: 87.1 fL (ref 80.0–100.0)
Platelets: 343 10*3/uL (ref 150–400)
RBC: 4.11 MIL/uL (ref 3.87–5.11)
RDW: 14.5 % (ref 11.5–15.5)
WBC: 5.7 10*3/uL (ref 4.0–10.5)
nRBC: 0 % (ref 0.0–0.2)

## 2021-07-16 LAB — PREGNANCY, URINE: Preg Test, Ur: NEGATIVE

## 2021-07-16 LAB — POC URINE PREG, ED: Preg Test, Ur: NEGATIVE

## 2021-07-16 NOTE — Discharge Instructions (Signed)
Follow-up with Dr. Despina Hidden or one of his colleagues in 1 to 2 weeks for your abdominal discomfort.  Take Tylenol Motrin for pain

## 2021-07-16 NOTE — ED Provider Notes (Signed)
[facility] EMERGENCY DEPARTMENT Provider Note   CSN: [id number] Arrival date & time: 07/16/21  1813     History  Chief Complaint  Patient presents with   Flank Pain    Donna Conrad is a 26 y.o. female.  Patient has no past medical history.  She complains of left lower quadrant abdominal pain that comes and goes.  Patient did not have any discharge no diarrhea no vomiting  The history is provided by the patient and medical records. No language interpreter was used.  Flank Pain This is a new problem. The current episode started 2 days ago. The problem occurs rarely. The problem has been resolved. Associated symptoms include abdominal pain. Pertinent negatives include no chest pain and no headaches. Nothing aggravates the symptoms. Nothing relieves the symptoms. The treatment provided no relief.      Home Medications Prior to Admission medications   Medication Sig Start Date End Date Taking? Authorizing Provider  cyclobenzaprine (FLEXERIL) 5 MG tablet Take 1 tablet (5 mg total) by mouth 3 (three) times daily as needed. 08/14/18   Rolland Porter, MD  doxycycline (VIBRAMYCIN) 100 MG capsule Take 1 capsule (100 mg total) by mouth 2 (two) times daily. 02/05/21   Triplett, Tammy, PA-C  etonogestrel (NEXPLANON) 68 MG IMPL implant Nexplanon 68 mg subdermal implant  Inject 1 implant by subcutaneous route. Patient not taking: Reported on 04/17/2021    [provider]  fluticasone (FLONASE) 50 MCG/ACT nasal spray Place 2 sprays into both nostrils daily. Patient not taking: No sig reported 11/25/17   Couture, Cortni S, PA-C  naproxen (NAPROSYN) 500 MG tablet Take 1 tablet (500 mg total) by mouth 2 (two) times daily with a meal. 03/08/19   Rancour, Annie Main, MD  ondansetron (ZOFRAN ODT) 4 MG disintegrating tablet 4mg  ODT q4 hours prn nausea/vomit Patient not taking: No sig reported 04/16/21   Mesner, Corene Cornea, MD  predniSONE (DELTASONE) 20 MG tablet Take 3 po QD x 3d , then 2 po QD x 3d then 1  po QD x 3d Patient not taking: No sig reported 08/14/18   Rolland Porter, MD  promethazine (PHENERGAN) 25 MG suppository Place 1 suppository (25 mg total) rectally every 6 (six) hours as needed for nausea or vomiting. 04/17/21   Noemi Chapel, MD  promethazine (PHENERGAN) 25 MG tablet Take 1 tablet (25 mg total) by mouth every 6 (six) hours as needed for nausea or vomiting. 04/17/21   Noemi Chapel, MD  valACYclovir (VALTREX) 500 MG tablet Take 1 tablet by mouth 2 (two) times daily as needed (outbreaks).  Patient not taking: Reported on 04/17/2021 02/01/17   [provider]      Allergies    Patient has no known allergies.    Review of Systems   Review of Systems  Constitutional:  Negative for appetite change and fatigue.  HENT:  Negative for congestion, ear discharge and sinus pressure.   Eyes:  Negative for discharge.  Respiratory:  Negative for cough.   Cardiovascular:  Negative for chest pain.  Gastrointestinal:  Positive for abdominal pain. Negative for diarrhea.  Genitourinary:  Positive for flank pain. Negative for frequency and hematuria.  Musculoskeletal:  Negative for back pain.  Skin:  Negative for rash.  Neurological:  Negative for seizures and headaches.  Psychiatric/Behavioral:  Negative for hallucinations.    Physical Exam Updated Vital Signs BP 126/69    Pulse 95    Temp 98.7 F (37.1 C) (Oral)    Resp 17  Ht 5\' 3"  (1.6 m)    Wt 98 kg    LMP 06/30/2021    SpO2 99%    BMI 38.27 kg/m  Physical Exam Vitals and nursing note reviewed.  Constitutional:      Appearance: She is well-developed.  HENT:     Head: Normocephalic.     Nose: Nose normal.  Eyes:     General: No scleral icterus.    Conjunctiva/sclera: Conjunctivae normal.  Neck:     Thyroid: No thyromegaly.  Cardiovascular:     Rate and Rhythm: Normal rate and regular rhythm.     Heart sounds: No murmur heard.   No friction rub. No gallop.  Pulmonary:     Breath sounds: No stridor. No wheezing or  rales.  Chest:     Chest wall: No tenderness.  Abdominal:     General: There is no distension.     Tenderness: There is no abdominal tenderness. There is no rebound.  Musculoskeletal:        General: Normal range of motion.     Cervical back: Neck supple.  Lymphadenopathy:     Cervical: No cervical adenopathy.  Skin:    Findings: No erythema or rash.  Neurological:     Mental Status: She is alert and oriented to person, place, and time.     Motor: No abnormal muscle tone.     Coordination: Coordination normal.  Psychiatric:        Behavior: Behavior normal.    ED Results / Procedures / Treatments   Labs (all labs ordered are listed, but only abnormal results are displayed) Labs Reviewed  URINALYSIS, ROUTINE W REFLEX MICROSCOPIC - Abnormal; Notable for the following components:      Result Value   APPearance HAZY (*)    All other components within normal limits  BASIC METABOLIC PANEL - Abnormal; Notable for the following components:   Calcium 8.6 (*)    All other components within normal limits  CBC - Abnormal; Notable for the following components:   Hemoglobin 11.9 (*)    HCT 35.8 (*)    All other components within normal limits  PREGNANCY, URINE  POC URINE PREG, ED    EKG None  Radiology No results found.  Procedures Procedures    Medications Ordered in ED Medications - No data to display  ED Course/ Medical Decision Making/ A&P                           Medical Decision Making Amount and/or Complexity of Data Reviewed Labs: ordered.   Labs and urinalysis unremarkable.  Patient not having discomfort anymore.  She is referred to GYN for further work-up    This patient presents to the ED for concern of abdominal pain and flank pain, this involves an extensive number of treatment options, and is a complaint that carries with it a high risk of complications and morbidity.  The differential diagnosis includes kidney stone, appendicitis   Co morbidities  that complicate the patient evaluation  None   Additional history obtained:  Additional history obtained from patient External records from outside source obtained and reviewed including hospital record   Lab Tests:  I Ordered, and personally interpreted labs.  The pertinent results include: CBC shows mild anemia 11.9 urinalysis negative   Imaging Studies ordered: No imaging  Cardiac Monitoring:  The patient was maintained on a cardiac monitor.  I personally viewed and interpreted the cardiac monitored which  showed an underlying rhythm of: Normal sinus rhythm   Medicines ordered and prescription drug management:  No medicines given Test Considered:  CT abdomen   Critical Interventions:  None   Consultations Obtained:  No consult  Problem List / ED Course:  Flank pain and abdominal pain   Reevaluation:  After the interventions noted above, I reevaluated the patient and found that they have :improved   Social Determinants of Health:  none    Dispostion:  After consideration of the diagnostic results and the patients response to treatment, I feel that the patent would benefit from discharge home with follow-up with GYN or family doctor.         Final Clinical Impression(s) / ED Diagnoses Final diagnoses:  Lower abdominal pain    Rx / DC Orders ED Discharge Orders     None         Milton Ferguson, MD 07/18/21 1106

## 2021-07-16 NOTE — ED Triage Notes (Signed)
Left flank pain since earlier today. Pressure when she urinates. Has never had a kidney stone.

## 2021-10-03 ENCOUNTER — Ambulatory Visit: Payer: Medicaid Other | Admitting: Internal Medicine

## 2021-10-04 ENCOUNTER — Emergency Department (HOSPITAL_COMMUNITY)
Admission: EM | Admit: 2021-10-04 | Discharge: 2021-10-04 | Discharge status: Against Medical Advice | Payer: Medicaid Other | Source: Home / Self Care

## 2021-10-05 ENCOUNTER — Encounter: Payer: Self-pay | Admitting: Emergency Medicine

## 2021-10-05 ENCOUNTER — Ambulatory Visit
Admission: EM | Admit: 2021-10-05 | Discharge: 2021-10-05 | Disposition: A | Discharge status: Home / Self Care | Payer: Medicaid Other | Attending: Nurse Practitioner | Admitting: Nurse Practitioner

## 2021-10-05 DIAGNOSIS — N939 Abnormal uterine and vaginal bleeding, unspecified: Secondary | ICD-10-CM

## 2021-10-05 LAB — POCT URINE PREGNANCY: Preg Test, Ur: NEGATIVE

## 2021-10-05 NOTE — ED Triage Notes (Signed)
Never had period for April.  Had some light pink bleeding and cramping yesterday.  States she passed a clot yesterday.  States she took a pregnancy test on 4/29 and the test was negative.  States she is bleeding now and bleeding is not consistent.  State she has been using tampons for the bleeding ?

## 2021-10-05 NOTE — ED Provider Notes (Signed)
?[facility] ? ? ? ?CSN: [id number] ?Arrival date & time: 10/05/21  0810 ? ? ?  ? ?History   ?Chief Complaint ?No chief complaint on file. ? ? ?HPI ?Donna Conrad is a 26 y.o. female.  ? ?The patient is a 26 year old female who presents for concerns for a miscarriage.  Patient states on 1 day ago, she started having spotting, which progressed to passing a blood clot.  She states the blood clot "did not look normal".  She states since that time she has also had some abdominal cramping.  Today she presents with continued vaginal bleeding, which she states "it looks normal today".  She is not changing more than 1 pad or tampon per hour at this time.  She denies fever, chills, nausea, vomiting, or diarrhea.  Patient's last period was on 08/29/2021.  States that she took a pregnancy test on 10/01/21, which was negative. ? ?The history is provided by the patient.  ? ?Past Medical History:  ?Diagnosis Date  ? Medical history non-contributory   ? Pre-eclampsia   ? ? ?Patient Active Problem List  ? Diagnosis Date Noted  ? [redacted] weeks gestation of pregnancy   ? Preeclampsia 11/17/2014  ? ? ?Past Surgical History:  ?Procedure Laterality Date  ? CESAREAN SECTION N/A 11/19/2014  ? Procedure: CESAREAN SECTION;  Surgeon: Sherian Rein, MD;  Location: [facility];  Service: Obstetrics;  Laterality: N/A;  ? WISDOM TOOTH EXTRACTION    ? ? ?OB History   ? ? Gravida  ?1  ? Para  ?1  ? Term  ?   ? Preterm  ?1  ? AB  ?   ? Living  ?1  ?  ? ? SAB  ?   ? IAB  ?   ? Ectopic  ?   ? Multiple  ?0  ? Live Births  ?1  ?   ?  ?  ? ? ? ?Home Medications   ? ?Prior to Admission medications   ?Medication Sig Start Date End Date Taking? Authorizing Provider  ?cyclobenzaprine (FLEXERIL) 5 MG tablet Take 1 tablet (5 mg total) by mouth 3 (three) times daily as needed. 08/14/18   Devoria Albe, MD  ?doxycycline (VIBRAMYCIN) 100 MG capsule Take 1 capsule (100 mg total) by mouth 2 (two) times daily. 02/05/21   Triplett, Tammy, PA-C  ?etonogestrel  (NEXPLANON) 68 MG IMPL implant Nexplanon 68 mg subdermal implant ? Inject 1 implant by subcutaneous route. ?Patient not taking: Reported on 04/17/2021    [provider]  ?fluticasone (FLONASE) 50 MCG/ACT nasal spray Place 2 sprays into both nostrils daily. ?Patient not taking: No sig reported 11/25/17   Couture, Cortni S, PA-C  ?naproxen (NAPROSYN) 500 MG tablet Take 1 tablet (500 mg total) by mouth 2 (two) times daily with a meal. 03/08/19   Rancour, Jeannett Senior, MD  ?ondansetron (ZOFRAN ODT) 4 MG disintegrating tablet 4mg  ODT q4 hours prn nausea/vomit ?Patient not taking: No sig reported 04/16/21   Mesner, 13/12/22, MD  ?predniSONE (DELTASONE) 20 MG tablet Take 3 po QD x 3d , then 2 po QD x 3d then 1 po QD x 3d ?Patient not taking: No sig reported 08/14/18   10/14/18, MD  ?promethazine (PHENERGAN) 25 MG suppository Place 1 suppository (25 mg total) rectally every 6 (six) hours as needed for nausea or vomiting. 04/17/21   04/19/21, MD  ?promethazine (PHENERGAN) 25 MG tablet Take 1 tablet (25 mg total) by mouth every 6 (six) hours as needed  for nausea or vomiting. 04/17/21   Eber Hong, MD  ?valACYclovir (VALTREX) 500 MG tablet Take 1 tablet by mouth 2 (two) times daily as needed (outbreaks).  ?Patient not taking: Reported on 04/17/2021 02/01/17   [provider]  ? ? ?Family History ?Family History  ?Problem Relation Age of Onset  ? Asthma Mother   ? Diabetes Mother   ? Hypertension Mother   ? Miscarriages / [place] Mother   ? Stroke Mother   ? Asthma Father   ? Depression Father   ? Stroke Father   ? Diabetes Sister   ? Mental illness Sister   ? Miscarriages / Stillbirths Sister   ? Diabetes Brother   ? ? ?Social History ?Social History  ? ?Tobacco Use  ? Smoking status: Never  ? Smokeless tobacco: Never  ?Vaping Use  ? Vaping Use: Never used  ?Substance Use Topics  ? Alcohol use: No  ? Drug use: Not Currently  ? ? ? ?Allergies   ?Patient has no known allergies. ? ? ?Review of Systems ?Review  of Systems  ?Constitutional: Negative.   ?Gastrointestinal:  Positive for abdominal pain (abdominal cramping).  ?Genitourinary:  Positive for menstrual problem, pelvic pain, vaginal bleeding and vaginal discharge. Negative for decreased urine volume, dysuria, flank pain, frequency and hematuria.  ?     LMP: 08/29/21  ?Skin: Negative.   ?Psychiatric/Behavioral: Negative.    ? ? ?Physical Exam ?Triage Vital Signs ?ED Triage Vitals [10/05/21 0822]  ?Enc Vitals Group  ?   BP (!) 138/95  ?   Pulse Rate 80  ?   Resp 18  ?   Temp 98.1 ?F (36.7 ?C)  ?   Temp Source Oral  ?   SpO2 99 %  ?   Weight   ?   Height   ?   Head Circumference   ?   Peak Flow   ?   Pain Score 6  ?   Pain Loc   ?   Pain Edu?   ?   Excl. in GC?   ? ?No data found. ? ?Updated Vital Signs ?BP (!) 138/95 (BP Location: Right Arm)   Pulse 80   Temp 98.1 ?F (36.7 ?C) (Oral)   Resp 18   LMP 08/29/2021 (Exact Date)   SpO2 99%  ? ?Visual Acuity ?Right Eye Distance:   ?Left Eye Distance:   ?Bilateral Distance:   ? ?Right Eye Near:   ?Left Eye Near:    ?Bilateral Near:    ? ?Physical Exam ?Vitals reviewed.  ?Constitutional:   ?   General: She is not in acute distress. ?   Appearance: She is well-developed.  ?HENT:  ?   Head: Normocephalic.  ?   Mouth/Throat:  ?   Mouth: Mucous membranes are moist.  ?Eyes:  ?   Extraocular Movements: Extraocular movements intact.  ?   Pupils: Pupils are equal, round, and reactive to light.  ?Cardiovascular:  ?   Rate and Rhythm: Normal rate and regular rhythm.  ?   Heart sounds: Normal heart sounds.  ?Pulmonary:  ?   Effort: Pulmonary effort is normal.  ?   Breath sounds: Normal breath sounds.  ?Abdominal:  ?   General: Bowel sounds are normal. There is no distension.  ?   Palpations: Abdomen is soft.  ?   Tenderness: There is no abdominal tenderness. There is no guarding or rebound.  ?Genitourinary: ?   Vagina: Normal. No vaginal discharge.  ?Musculoskeletal:  ?  Cervical back: Normal range of motion.  ?Skin: ?   General:  Skin is warm and dry.  ?   Findings: No erythema or rash.  ?Neurological:  ?   General: No focal deficit present.  ?   Mental Status: She is alert and oriented to person, place, and time.  ?   Cranial Nerves: No cranial nerve deficit.  ?Psychiatric:     ?   Mood and Affect: Mood normal.     ?   Behavior: Behavior normal.  ? ? ? ?UC Treatments / Results  ?Labs ?(all labs ordered are listed, but only abnormal results are displayed) ?Labs Reviewed  ?POCT URINE PREGNANCY  ? ? ?EKG ? ? ?Radiology ?No results found. ? ?Procedures ?Procedures (including critical care time) ? ?Medications Ordered in UC ?Medications - No data to display ? ?Initial Impression / Assessment and Plan / UC Course  ?I have reviewed the triage vital signs and the nursing notes. ? ?Pertinent labs & imaging results that were available during my care of the patient were reviewed by me and considered in my medical decision making (see chart for details). ? ?The patient is a 26 year old female who presents for a "possible miscarriage".  Patient states that she has not had a period since the end of March.  She states that she developed abnormal uterine bleeding x1 day.  Today, she continues to complain of vaginal bleeding, but states symptoms more like a period.  She is using tampons, but is not saturating more than 1 tampon per hour.  Her vital signs are stable, she is in no acute distress.  Her urine pregnancy test was negative today.  Patient also took a pregnancy test at the end of April which was also negative.  Do not think that she is having a miscarriage today.  Symptoms are consistent with a abnormal menstrual cycle.  Patient was advised of the same.  Patient encouraged to take ibuprofen or Tylenol for any abdominal pain or discomfort.  Advised that if symptoms continue to persist or worsen, she should follow-up with her PCP or go to the ER.  Patient advised to go to ER if she begins saturating more than 2-3 pads or tampons per hour with  worsening abdominal pain or cramping that is not relieved by over-the-counter medication or with menstrual bleeding that has moderate blood loss..  Follow-up as needed. ?Final Clinical Impressions(s) / UC Diagnoses  ? ?F

## 2021-10-05 NOTE — Discharge Instructions (Addendum)
Your urine pregnancy test was negative today. ?Based on the negative pregnancy test, it is unlikely that you have had a miscarriage. ?Symptoms are more consistent with an abnormal menstrual cycle. ?If you begin to saturate more than 2-3 pads or tampons per hour or for worsening vaginal bleeding that is uncontrolled, go to the emergency room immediately.  This also includes worsening abdominal cramping. ?You may take ibuprofen or Tylenol as needed for pain. ?Follow-up as needed. ?

## 2021-10-11 ENCOUNTER — Ambulatory Visit: Payer: Medicaid Other | Admitting: Internal Medicine

## 2021-11-09 ENCOUNTER — Ambulatory Visit
Admission: EM | Admit: 2021-11-09 | Discharge: 2021-11-09 | Disposition: A | Discharge status: Home / Self Care | Payer: Medicaid Other | Attending: Family Medicine | Admitting: Family Medicine

## 2021-11-09 ENCOUNTER — Encounter: Payer: Self-pay | Admitting: Emergency Medicine

## 2021-11-09 DIAGNOSIS — R102 Pelvic and perineal pain: Secondary | ICD-10-CM | POA: Diagnosis not present

## 2021-11-09 DIAGNOSIS — R110 Nausea: Secondary | ICD-10-CM | POA: Insufficient documentation

## 2021-11-09 DIAGNOSIS — R300 Dysuria: Secondary | ICD-10-CM | POA: Diagnosis present

## 2021-11-09 LAB — POCT URINALYSIS DIP (MANUAL ENTRY)
Bilirubin, UA: NEGATIVE
Blood, UA: NEGATIVE
Glucose, UA: 100 mg/dL — AB
Ketones, POC UA: NEGATIVE mg/dL
Nitrite, UA: POSITIVE — AB
Protein Ur, POC: 100 mg/dL — AB
Spec Grav, UA: 1.01 (ref 1.010–1.025)
Urobilinogen, UA: 4 E.U./dL — AB
pH, UA: 6 (ref 5.0–8.0)

## 2021-11-09 MED ORDER — ONDANSETRON 4 MG PO TBDP: 4.0000 mg | ORAL_TABLET | Freq: Three times a day (TID) | ORAL | 0 refills | Status: DC | PRN

## 2021-11-09 MED ORDER — CEPHALEXIN 500 MG PO CAPS: 500.0000 mg | ORAL_CAPSULE | Freq: Two times a day (BID) | ORAL | 0 refills | Status: DC

## 2021-11-09 NOTE — ED Triage Notes (Signed)
Did a home UTI test that was positive.  Lower ABD pain, urinary frequency x 3 days.  Has taken AZO.  Also wants STD testing.

## 2021-11-09 NOTE — ED Provider Notes (Signed)
[facility]    CSN: [id number] Arrival date & time: 11/09/21  0827      History   Chief Complaint No chief complaint on file.  HPI Donna Conrad is a 26 y.o. female.   Presenting today with 3-day history of suprapubic pain, urinary frequency, mild dysuria.  Also states yesterday she was having some waves of chills, sweats, nausea but no vomiting, diarrhea, constipation.  Denies vaginal symptoms, fever, chills, flank pain, hematuria.  Requesting STI testing as well.  Took a home UTI test that was positive and has been taking Azo regularly since symptom onset.  LMP 11/02/2021.  Past Medical History:  Diagnosis Date   Medical history non-contributory    Pre-eclampsia    Patient Active Problem List   Diagnosis Date Noted   [redacted] weeks gestation of pregnancy    Preeclampsia 11/17/2014    Past Surgical History:  Procedure Laterality Date   CESAREAN SECTION N/A 11/19/2014   Procedure: CESAREAN SECTION;  Surgeon: Sherian Rein, MD;  Location: [facility];  Service: Obstetrics;  Laterality: N/A;   WISDOM TOOTH EXTRACTION     OB History     Gravida  1   Para  1   Term      Preterm  1   AB      Living  1      SAB      IAB      Ectopic      Multiple  0   Live Births  1           Home Medications    Prior to Admission medications   Medication Sig Start Date End Date Taking? Authorizing Provider  cephALEXin (KEFLEX) 500 MG capsule Take 1 capsule (500 mg total) by mouth 2 (two) times daily. 11/09/21  Yes Particia Nearing, PA-C  ondansetron (ZOFRAN-ODT) 4 MG disintegrating tablet Take 1 tablet (4 mg total) by mouth every 8 (eight) hours as needed for nausea or vomiting. 11/09/21  Yes Particia Nearing, PA-C  cyclobenzaprine (FLEXERIL) 5 MG tablet Take 1 tablet (5 mg total) by mouth 3 (three) times daily as needed. 08/14/18   Devoria Albe, MD  doxycycline (VIBRAMYCIN) 100 MG capsule Take 1 capsule (100 mg total) by mouth 2 (two) times daily.  02/05/21   Triplett, Tammy, PA-C  etonogestrel (NEXPLANON) 68 MG IMPL implant Nexplanon 68 mg subdermal implant  Inject 1 implant by subcutaneous route. Patient not taking: Reported on 04/17/2021    [provider]  fluticasone (FLONASE) 50 MCG/ACT nasal spray Place 2 sprays into both nostrils daily. Patient not taking: No sig reported 11/25/17   Couture, Cortni S, PA-C  naproxen (NAPROSYN) 500 MG tablet Take 1 tablet (500 mg total) by mouth 2 (two) times daily with a meal. 03/08/19   Rancour, Jeannett Senior, MD  ondansetron (ZOFRAN ODT) 4 MG disintegrating tablet 4mg  ODT q4 hours prn nausea/vomit Patient not taking: No sig reported 04/16/21   Mesner, 13/12/22, MD  predniSONE (DELTASONE) 20 MG tablet Take 3 po QD x 3d , then 2 po QD x 3d then 1 po QD x 3d Patient not taking: No sig reported 08/14/18   10/14/18, MD  promethazine (PHENERGAN) 25 MG suppository Place 1 suppository (25 mg total) rectally every 6 (six) hours as needed for nausea or vomiting. 04/17/21   04/19/21, MD  promethazine (PHENERGAN) 25 MG tablet Take 1 tablet (25 mg total) by mouth every 6 (six) hours as needed for nausea or vomiting.  04/17/21   Eber HongMiller, Brian, MD  valACYclovir (VALTREX) 500 MG tablet Take 1 tablet by mouth 2 (two) times daily as needed (outbreaks).  Patient not taking: Reported on 04/17/2021 02/01/17   [provider]   Family History Family History  Problem Relation Age of Onset   Asthma Mother    Diabetes Mother    Hypertension Mother    Miscarriages / [place] Mother    Stroke Mother    Asthma Father    Depression Father    Stroke Father    Diabetes Sister    Mental illness Sister    Miscarriages / [place] Sister    Diabetes Brother    Social History Social History   Tobacco Use   Smoking status: Never   Smokeless tobacco: Never  Vaping Use   Vaping Use: Never used  Substance Use Topics   Alcohol use: No   Drug use: Not Currently   Allergies   Patient has no known  allergies.   Review of Systems Review of Systems PER HPI  Physical Exam Triage Vital Signs ED Triage Vitals  Enc Vitals Group     BP 11/09/21 0833 134/90     Pulse Rate 11/09/21 0833 72     Resp 11/09/21 0833 18     Temp 11/09/21 0833 98.6 F (37 C)     Temp Source 11/09/21 0833 Oral     SpO2 11/09/21 0833 98 %     Weight --      Height --      Head Circumference --      Peak Flow --      Pain Score 11/09/21 0835 8     Pain Loc --      Pain Edu? --      Excl. in GC? --    No data found.  Updated Vital Signs BP 134/90 (BP Location: Right Arm)   Pulse 72   Temp 98.6 F (37 C) (Oral)   Resp 18   LMP 11/02/2021 (Exact Date)   SpO2 98%   Visual Acuity Right Eye Distance:   Left Eye Distance:   Bilateral Distance:    Right Eye Near:   Left Eye Near:    Bilateral Near:     Physical Exam Vitals and nursing note reviewed.  Constitutional:      Appearance: Normal appearance. She is not ill-appearing.  HENT:     Head: Atraumatic.     Mouth/Throat:     Mouth: Mucous membranes are moist.  Eyes:     Extraocular Movements: Extraocular movements intact.     Conjunctiva/sclera: Conjunctivae normal.  Cardiovascular:     Rate and Rhythm: Normal rate and regular rhythm.     Heart sounds: Normal heart sounds.  Pulmonary:     Effort: Pulmonary effort is normal.     Breath sounds: Normal breath sounds.  Abdominal:     General: Bowel sounds are normal. There is no distension.     Palpations: Abdomen is soft.     Tenderness: There is no abdominal tenderness. There is guarding.     Comments: Mild diffuse lower abdominal tenderness to palpation without distention or guarding  Genitourinary:    Comments: GU exam deferred, self swab performed Musculoskeletal:        General: Normal range of motion.     Cervical back: Normal range of motion and neck supple.  Skin:    General: Skin is warm and dry.  Neurological:     Mental Status:  She is alert and oriented to person,  place, and time.     Motor: No weakness.     Gait: Gait normal.  Psychiatric:        Mood and Affect: Mood normal.        Thought Content: Thought content normal.        Judgment: Judgment normal.     UC Treatments / Results  Labs (all labs ordered are listed, but only abnormal results are displayed) Labs Reviewed  POCT URINALYSIS DIP (MANUAL ENTRY) - Abnormal; Notable for the following components:      Result Value   Color, UA orange (*)    Glucose, UA =100 (*)    Protein Ur, POC =100 (*)    Urobilinogen, UA 4.0 (*)    Nitrite, UA Positive (*)    Leukocytes, UA Large (3+) (*)    All other components within normal limits  URINE CULTURE  CERVICOVAGINAL ANCILLARY ONLY    EKG   Radiology No results found.  Procedures Procedures (including critical care time)  Medications Ordered in UC Medications - No data to display  Initial Impression / Assessment and Plan / UC Course  I have reviewed the triage vital signs and the nursing notes.  Pertinent labs & imaging results that were available during my care of the patient were reviewed by me and considered in my medical decision making (see chart for details).     Vitals and exam overall very reassuring today, urinalysis showing evidence of urinary tract infection however she has taken Azo recently so discussed that these can be very inaccurate with Azo in the system.  Will cover for a urinary tract infection while awaiting urine culture for confirmation.  We will also send Zofran for occasional nausea and chills that she has been having, unsure if this is a new viral illness versus related to a possible GU infection.  Vaginal swab pending for rule out of STIs per her request.  Return for worsening symptoms.  Final Clinical Impressions(s) / UC Diagnoses   Final diagnoses:  Dysuria  Suprapubic pain  Nausea without vomiting   Discharge Instructions   None    ED Prescriptions     Medication Sig Dispense Auth. Provider    ondansetron (ZOFRAN-ODT) 4 MG disintegrating tablet Take 1 tablet (4 mg total) by mouth every 8 (eight) hours as needed for nausea or vomiting. 20 tablet Particia Nearing, [place]   cephALEXin (KEFLEX) 500 MG capsule Take 1 capsule (500 mg total) by mouth 2 (two) times daily. 20 capsule Particia Nearing, [place]      PDMP not reviewed this encounter.   Roosvelt Maser [place], [place] 11/09/21 [phone]

## 2021-11-10 LAB — CERVICOVAGINAL ANCILLARY ONLY
Bacterial Vaginitis (gardnerella): POSITIVE — AB
Candida Glabrata: NEGATIVE
Candida Vaginitis: NEGATIVE
Chlamydia: NEGATIVE
Comment: NEGATIVE
Comment: NEGATIVE
Comment: NEGATIVE
Comment: NEGATIVE
Comment: NEGATIVE
Comment: NORMAL
Neisseria Gonorrhea: NEGATIVE
Trichomonas: NEGATIVE

## 2021-11-11 ENCOUNTER — Telehealth (HOSPITAL_COMMUNITY): Payer: Self-pay | Admitting: Emergency Medicine

## 2021-11-11 LAB — URINE CULTURE

## 2021-11-11 MED ORDER — METRONIDAZOLE 500 MG PO TABS: 500.0000 mg | ORAL_TABLET | Freq: Two times a day (BID) | ORAL | 0 refills | Status: DC

## 2022-01-19 ENCOUNTER — Encounter: Payer: Self-pay | Admitting: Emergency Medicine

## 2022-01-19 ENCOUNTER — Other Ambulatory Visit: Payer: Self-pay

## 2022-01-19 ENCOUNTER — Ambulatory Visit
Admission: EM | Admit: 2022-01-19 | Discharge: 2022-01-19 | Disposition: A | Discharge status: Home / Self Care | Payer: Medicaid Other | Attending: Family Medicine | Admitting: Family Medicine

## 2022-01-19 DIAGNOSIS — R030 Elevated blood-pressure reading, without diagnosis of hypertension: Secondary | ICD-10-CM | POA: Diagnosis not present

## 2022-01-19 NOTE — ED Triage Notes (Signed)
Pt reports was at dentist this am and was told BP was elevated. Pt reports intermittent headache/chest pain for last several days and reports has been under a lot of stress lately. Denies pain at this time. NAD noted.

## 2022-01-19 NOTE — ED Provider Notes (Signed)
[facility]   [id number] 01/19/22 Arrival Time: 1100  ASSESSMENT & PLAN:  1. Elevated blood pressure reading without diagnosis of hypertension     Discharge Instructions      Your blood pressure was noted to be elevated during your visit today. If you are currently taking medication for high blood pressure, please ensure you are taking this as directed. If you do not have a history of high blood pressure and your blood pressure remains persistently elevated, you may need to begin taking a medication at some point. You may return here within the next few days to recheck if unable to see your primary care provider or if you do not have a one.  BP (!) 146/116 (BP Location: Right Arm)   Pulse 65   Temp 98.3 F (36.8 C) (Oral)   Resp 18   LMP 12/31/2021 (Approximate)   SpO2 99%   BP Readings from Last 3 Encounters:  01/19/22 (!) 146/116  11/09/21 134/90  10/05/21 (!) 138/95   I would recommend purchasing a blood pressure cuff in order to check your pressures a couple times a day for the next 1-2 weeks.   Follow-up Information     Schedule an appointment as soon as possible for a visit  with [facility]   Contact information: [address] [place] [address] [phone]                 Reviewed expectations re: course of current medical issues. Questions answered. Outlined signs and symptoms indicating need for more acute intervention. Patient verbalized understanding. After Visit Summary given.   SUBJECTIVE:  Donna Conrad is a 26 y.o. female who presents with concerns regarding increased blood pressures. She reports that she has not been treated for hypertension in the past.  She reports no chest pain on exertion, no dyspnea on exertion, no swelling of ankles, no orthostatic dizziness or lightheadedness, no orthopnea or paroxysmal nocturnal dyspnea, and no palpitations.  Works 3 jobs; decreased sleep. Feels this may be  contributing to elevated BP.  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   OBJECTIVE:  Vitals:   01/19/22 1157  BP: (!) 146/116  Pulse: 65  Resp: 18  Temp: 98.3 F (36.8 C)  TempSrc: Oral  SpO2: 99%    General appearance: alert; no distress; obese Eyes: PERRLA; EOMI HENT: normocephalic; atraumatic Neck: supple Lungs: clear to auscultation bilaterally Heart: regular rate and rhythm Abdomen: soft, non-tender; bowel sounds normal Extremities: no edema; symmetrical with no gross deformities Skin: warm and dry Psychological: alert and cooperative; normal mood and affect   No Known Allergies  Past Medical History:  Diagnosis Date   Medical history non-contributory    Pre-eclampsia    Social History   Socioeconomic History   Marital status: Single    Spouse name: Not on file   Number of children: Not on file   Years of education: Not on file   Highest education level: Not on file  Occupational History   Not on file  Tobacco Use   Smoking status: Never   Smokeless tobacco: Never  Vaping Use   Vaping Use: Never used  Substance and Sexual Activity   Alcohol use: No   Drug use: Not Currently   Sexual activity: Yes  Other Topics Concern   Not on file  Social History Narrative   Not on file  Social Determinants of Health   Financial Resource Strain: Not on file  Food Insecurity: Not on file  Transportation Needs: Not on file  Physical Activity: Not on file  Stress: Not on file  Social Connections: Not on file  Intimate Partner Violence: Not on file   Family History  Problem Relation Age of Onset   Asthma Mother    Diabetes Mother    Hypertension Mother    Miscarriages / [place] Mother    Stroke Mother    Asthma Father    Depression Father    Stroke Father    Diabetes Sister    Mental illness Sister    Miscarriages / Stillbirths Sister    Diabetes Brother    Past  Surgical History:  Procedure Laterality Date   CESAREAN SECTION N/A 11/19/2014   Procedure: CESAREAN SECTION;  Surgeon: Sherian Rein, MD;  Location: [facility];  Service: Obstetrics;  Laterality: N/A;   WISDOM TOOTH EXTRACTION         Mardella Layman, MD 01/19/22 1217

## 2022-01-19 NOTE — Discharge Instructions (Signed)
Your blood pressure was noted to be elevated during your visit today. If you are currently taking medication for high blood pressure, please ensure you are taking this as directed. If you do not have a history of high blood pressure and your blood pressure remains persistently elevated, you may need to begin taking a medication at some point. You may return here within the next few days to recheck if unable to see your primary care provider or if you do not have a one.  BP (!) 146/116 (BP Location: Right Arm)   Pulse 65   Temp 98.3 F (36.8 C) (Oral)   Resp 18   LMP 12/31/2021 (Approximate)   SpO2 99%   BP Readings from Last 3 Encounters:  01/19/22 (!) 146/116  11/09/21 134/90  10/05/21 (!) 138/95   I would recommend purchasing a blood pressure cuff in order to check your pressures a couple times a day for the next 1-2 weeks.

## 2022-04-29 ENCOUNTER — Ambulatory Visit
Admission: EM | Admit: 2022-04-29 | Discharge: 2022-04-29 | Disposition: A | Discharge status: Home / Self Care | Payer: Medicaid Other | Attending: Nurse Practitioner | Admitting: Nurse Practitioner

## 2022-04-29 DIAGNOSIS — J029 Acute pharyngitis, unspecified: Secondary | ICD-10-CM | POA: Diagnosis present

## 2022-04-29 DIAGNOSIS — Z20822 Contact with and (suspected) exposure to covid-19: Secondary | ICD-10-CM | POA: Insufficient documentation

## 2022-04-29 LAB — POCT RAPID STREP A (OFFICE): Rapid Strep A Screen: NEGATIVE

## 2022-04-29 LAB — SARS CORONAVIRUS 2 (TAT 6-24 HRS): SARS Coronavirus 2: NEGATIVE

## 2022-04-29 NOTE — ED Provider Notes (Signed)
[facility]    CSN: [id number] Arrival date & time: 04/29/22  1019      History   Chief Complaint Chief Complaint  Patient presents with   Sore Throat   Covid Exposure    HPI Donna Conrad is a 26 y.o. female.   The history is provided by the patient.   The patient presents for complaints of sore throat and recent COVID exposure.  Patient is requesting COVID testing.  She denies fever, chills, body aches, loss of taste or smell, cough, congestion, runny nose, abdominal pain, nausea, vomiting, or diarrhea.  She states that the symptoms started today.  She reports that her COVID exposure was approximately 5 days ago.  States that she did receive 2 COVID vaccines.  Past Medical History:  Diagnosis Date   Medical history non-contributory    Pre-eclampsia     Patient Active Problem List   Diagnosis Date Noted   [redacted] weeks gestation of pregnancy    Preeclampsia 11/17/2014    Past Surgical History:  Procedure Laterality Date   CESAREAN SECTION N/A 11/19/2014   Procedure: CESAREAN SECTION;  Surgeon: Sherian Rein, MD;  Location: [facility];  Service: Obstetrics;  Laterality: N/A;   WISDOM TOOTH EXTRACTION      OB History     Gravida  1   Para  1   Term      Preterm  1   AB      Living  1      SAB      IAB      Ectopic      Multiple  0   Live Births  1            Home Medications    Prior to Admission medications   Medication Sig Start Date End Date Taking? Authorizing Provider  cyclobenzaprine (FLEXERIL) 5 MG tablet Take 1 tablet (5 mg total) by mouth 3 (three) times daily as needed. 08/14/18   Devoria Albe, MD  doxycycline (VIBRAMYCIN) 100 MG capsule Take 1 capsule (100 mg total) by mouth 2 (two) times daily. 02/05/21   Triplett, Tammy, PA-C  etonogestrel (NEXPLANON) 68 MG IMPL implant Nexplanon 68 mg subdermal implant  Inject 1 implant by subcutaneous route. Patient not taking: Reported on 04/17/2021    [provider]   fluticasone (FLONASE) 50 MCG/ACT nasal spray Place 2 sprays into both nostrils daily. Patient not taking: Reported on 04/17/2021 11/25/17   Couture, Cortni S, PA-C  metroNIDAZOLE (FLAGYL) 500 MG tablet Take 1 tablet (500 mg total) by mouth 2 (two) times daily. 11/11/21   Lamptey, Britta Mccreedy, MD  naproxen (NAPROSYN) 500 MG tablet Take 1 tablet (500 mg total) by mouth 2 (two) times daily with a meal. 03/08/19   Rancour, Jeannett Senior, MD  ondansetron (ZOFRAN ODT) 4 MG disintegrating tablet 4mg  ODT q4 hours prn nausea/vomit Patient not taking: Reported on 04/17/2021 04/16/21   Mesner, 13/12/22, MD  ondansetron (ZOFRAN-ODT) 4 MG disintegrating tablet Take 1 tablet (4 mg total) by mouth every 8 (eight) hours as needed for nausea or vomiting. 11/09/21   01/09/22, PA-C  predniSONE (DELTASONE) 20 MG tablet Take 3 po QD x 3d , then 2 po QD x 3d then 1 po QD x 3d Patient not taking: Reported on 04/17/2021 08/14/18   10/14/18, MD  promethazine (PHENERGAN) 25 MG suppository Place 1 suppository (25 mg total) rectally every 6 (six) hours as needed for nausea or vomiting. 04/17/21   04/19/21,  Arlys John, MD  promethazine (PHENERGAN) 25 MG tablet Take 1 tablet (25 mg total) by mouth every 6 (six) hours as needed for nausea or vomiting. 04/17/21   Eber Hong, MD  valACYclovir (VALTREX) 500 MG tablet Take 1 tablet by mouth 2 (two) times daily as needed (outbreaks).  Patient not taking: Reported on 04/17/2021 02/01/17   [provider]    Family History Family History  Problem Relation Age of Onset   Asthma Mother    Diabetes Mother    Hypertension Mother    Miscarriages / [place] Mother    Stroke Mother    Asthma Father    Depression Father    Stroke Father    Diabetes Sister    Mental illness Sister    Miscarriages / [place] Sister    Diabetes Brother     Social History Social History   Tobacco Use   Smoking status: Never   Smokeless tobacco: Never  Vaping Use   Vaping Use: Never  used  Substance Use Topics   Alcohol use: No   Drug use: Not Currently     Allergies   Patient has no known allergies.   Review of Systems Review of Systems Per HPI  Physical Exam Triage Vital Signs ED Triage Vitals [04/29/22 1150]  Enc Vitals Group     BP (!) 147/95     Pulse Rate 75     Resp 17     Temp 98.5 F (36.9 C)     Temp Source Oral     SpO2 99 %     Weight      Height      Head Circumference      Peak Flow      Pain Score      Pain Loc      Pain Edu?      Excl. in GC?    No data found.  Updated Vital Signs BP (!) 147/95 (BP Location: Right Arm)   Pulse 75   Temp 98.5 F (36.9 C) (Oral)   Resp 17   LMP 04/27/2022 (Exact Date)   SpO2 99%   Visual Acuity Right Eye Distance:   Left Eye Distance:   Bilateral Distance:    Right Eye Near:   Left Eye Near:    Bilateral Near:     Physical Exam Vitals and nursing note reviewed.  Constitutional:      General: She is not in acute distress.    Appearance: She is well-developed.  HENT:     Head: Normocephalic and atraumatic.     Right Ear: Tympanic membrane and ear canal normal.     Left Ear: Tympanic membrane and ear canal normal.     Nose: No congestion or rhinorrhea.     Mouth/Throat:     Mouth: Mucous membranes are moist.     Pharynx: Pharyngeal swelling and posterior oropharyngeal erythema present.     Tonsils: 1+ on the right. 1+ on the left.  Eyes:     Conjunctiva/sclera: Conjunctivae normal.  Cardiovascular:     Rate and Rhythm: Normal rate and regular rhythm.     Heart sounds: No murmur heard. Pulmonary:     Effort: Pulmonary effort is normal. No respiratory distress.     Breath sounds: Normal breath sounds.  Abdominal:     Palpations: Abdomen is soft.     Tenderness: There is no abdominal tenderness.  Musculoskeletal:        General: No swelling.  Cervical back: Normal range of motion and neck supple.  Skin:    General: Skin is warm and dry.     Capillary Refill: Capillary  refill takes less than 2 seconds.  Neurological:     Mental Status: She is alert.  Psychiatric:        Mood and Affect: Mood normal.      UC Treatments / Results  Labs (all labs ordered are listed, but only abnormal results are displayed) Labs Reviewed  SARS CORONAVIRUS 2 (TAT 6-24 HRS)  POCT RAPID STREP A (OFFICE)    EKG   Radiology No results found.  Procedures Procedures (including critical care time)  Medications Ordered in UC Medications - No data to display  Initial Impression / Assessment and Plan / UC Course  I have reviewed the triage vital signs and the nursing notes.  Pertinent labs & imaging results that were available during my care of the patient were reviewed by me and considered in my medical decision making (see chart for details).  Patient presents for complaints of sore throat with nausea and a recent COVID exposure.  Symptoms started today.  On exam, she is mildly hypertensive, but vitals are otherwise stable, she is in no acute distress.  Rapid strep test is negative, throat culture is pending along with COVID test.  Supportive care recommendations were provided to the patient to include over-the-counter analgesics for pain or discomfort, warm salt water gargles, and getting plenty of rest.  Patient declines use of antiviral therapy if COVID test is positive.  Patient verbalizes understanding.  All questions were answered.  Work note was provided. Final Clinical Impressions(s) / UC Diagnoses   Final diagnoses:  Exposure to COVID-19 virus  Sore throat     Discharge Instructions      Rapid strep test is negative, a throat culture and COVID test are pending.  You will be contacted if the results of the pending test are positive.  As discussed, you declined use of antiviral if the COVID test is positive. Increase fluids and allow for plenty of rest. May take over-the-counter Tylenol or ibuprofen as needed for pain or discomfort. Warm salt water  gargles 3-4 times daily while symptoms persist. Recommend a brat diet to help with nausea.  This includes bananas, rice, applesauce, and toast. Follow-up if with your primary care physician if symptoms fail to improve.     ED Prescriptions   None    PDMP not reviewed this encounter.   Abran Cantor, NP 04/29/22 1236

## 2022-04-29 NOTE — Discharge Instructions (Addendum)
Rapid strep test is negative, a throat culture and COVID test are pending.  You will be contacted if the results of the pending test are positive.  As discussed, you declined use of antiviral if the COVID test is positive. Increase fluids and allow for plenty of rest. May take over-the-counter Tylenol or ibuprofen as needed for pain or discomfort. Warm salt water gargles 3-4 times daily while symptoms persist. Recommend a brat diet to help with nausea.  This includes bananas, rice, applesauce, and toast. Follow-up if with your primary care physician if symptoms fail to improve.

## 2022-04-29 NOTE — ED Triage Notes (Signed)
Pt reports sore throat and nausea started today. Pt reports she was exposed to COVID 5 days ago.

## 2022-05-01 LAB — CULTURE, GROUP A STREP (THRC)

## 2022-05-04 ENCOUNTER — Telehealth: Payer: Self-pay | Admitting: Emergency Medicine

## 2022-05-04 MED ORDER — AMOXICILLIN 500 MG PO CAPS: 500.0000 mg | ORAL_CAPSULE | Freq: Two times a day (BID) | ORAL | 0 refills | Status: AC

## 2022-07-04 ENCOUNTER — Ambulatory Visit: Payer: Medicaid Other

## 2022-10-08 ENCOUNTER — Emergency Department (HOSPITAL_COMMUNITY)
Admission: EM | Admit: 2022-10-08 | Discharge: 2022-10-08 | Disposition: A | Discharge status: Home / Self Care | Payer: Medicaid Other | Attending: Emergency Medicine | Admitting: Emergency Medicine

## 2022-10-08 ENCOUNTER — Encounter (HOSPITAL_COMMUNITY): Payer: Self-pay

## 2022-10-08 ENCOUNTER — Other Ambulatory Visit: Payer: Self-pay

## 2022-10-08 DIAGNOSIS — Z3201 Encounter for pregnancy test, result positive: Secondary | ICD-10-CM | POA: Insufficient documentation

## 2022-10-08 DIAGNOSIS — Z3200 Encounter for pregnancy test, result unknown: Secondary | ICD-10-CM | POA: Diagnosis present

## 2022-10-08 LAB — URINALYSIS, ROUTINE W REFLEX MICROSCOPIC
Bacteria, UA: NONE SEEN
Bilirubin Urine: NEGATIVE
Glucose, UA: NEGATIVE mg/dL
Hgb urine dipstick: NEGATIVE
Ketones, ur: NEGATIVE mg/dL
Nitrite: NEGATIVE
Protein, ur: NEGATIVE mg/dL
Specific Gravity, Urine: 1.028 (ref 1.005–1.030)
pH: 6 (ref 5.0–8.0)

## 2022-10-08 LAB — POC URINE PREG, ED: Preg Test, Ur: POSITIVE — AB

## 2022-10-08 NOTE — Discharge Instructions (Signed)
Your pregnancy test today was positive.  [facility] contact [facility] to arrange follow-up appointment in their clinic.  You may take over-the-counter prenatal vitamin.  Return to the emergency department for any new or worsening symptoms

## 2022-10-08 NOTE — ED Provider Notes (Signed)
[place] EMERGENCY DEPARTMENT AT [facility] Provider Note   CSN: [id number] Arrival date & time: 10/08/22  1533     History {Add pertinent medical, surgical, social history, OB history to HPI:1} No chief complaint on file.   Donna Conrad is a 27 y.o. female.  HPI      Donna Conrad is a 27 y.o. female  who presents to the Emergency Department requesting confirmation of pregnancy.  States that she took a home pregnancy test just before ER arrival and result was positive.  She notes some breast tenderness this morning which prompted her to take a for home pregnancy test.  LMP was 09/04/2022, states her periods have been regular.  Also concerned that she may have UTI and request to have urine tested.  She denies any fever, vaginal discharge or bleeding, abdominal pain, vomiting or diarrhea.   Home Medications Prior to Admission medications   Medication Sig Start Date End Date Taking? Authorizing Provider  cyclobenzaprine (FLEXERIL) 5 MG tablet Take 1 tablet (5 mg total) by mouth 3 (three) times daily as needed. 08/14/18   Devoria Albe, MD  etonogestrel (NEXPLANON) 68 MG IMPL implant Nexplanon 68 mg subdermal implant  Inject 1 implant by subcutaneous route. Patient not taking: Reported on 04/17/2021    [provider]  fluticasone (FLONASE) 50 MCG/ACT nasal spray Place 2 sprays into both nostrils daily. Patient not taking: Reported on 04/17/2021 11/25/17   Couture, Cortni S, PA-C  naproxen (NAPROSYN) 500 MG tablet Take 1 tablet (500 mg total) by mouth 2 (two) times daily with a meal. 03/08/19   Rancour, Jeannett Senior, MD  ondansetron (ZOFRAN ODT) 4 MG disintegrating tablet 4mg  ODT q4 hours prn nausea/vomit Patient not taking: Reported on 04/17/2021 04/16/21   Mesner, Barbara Cower, MD  ondansetron (ZOFRAN-ODT) 4 MG disintegrating tablet Take 1 tablet (4 mg total) by mouth every 8 (eight) hours as needed for nausea or vomiting. 11/09/21   Particia Nearing, PA-C  predniSONE  (DELTASONE) 20 MG tablet Take 3 po QD x 3d , then 2 po QD x 3d then 1 po QD x 3d Patient not taking: Reported on 04/17/2021 08/14/18   Devoria Albe, MD  promethazine (PHENERGAN) 25 MG suppository Place 1 suppository (25 mg total) rectally every 6 (six) hours as needed for nausea or vomiting. 04/17/21   Eber Hong, MD  promethazine (PHENERGAN) 25 MG tablet Take 1 tablet (25 mg total) by mouth every 6 (six) hours as needed for nausea or vomiting. 04/17/21   Eber Hong, MD  valACYclovir (VALTREX) 500 MG tablet Take 1 tablet by mouth 2 (two) times daily as needed (outbreaks).  Patient not taking: Reported on 04/17/2021 02/01/17   [provider]      Allergies    Patient has no known allergies.    Review of Systems   Review of Systems  Constitutional:  Negative for appetite change, chills and fever.  Respiratory:  Negative for cough and shortness of breath.   Cardiovascular:  Negative for chest pain.  Gastrointestinal:  Negative for abdominal pain, nausea and vomiting.  Genitourinary:  Negative for dysuria and flank pain.  Musculoskeletal:  Negative for arthralgias, neck pain and neck stiffness.  Skin:  Negative for color change and wound.  Neurological:  Negative for dizziness, syncope, weakness and numbness.  Psychiatric/Behavioral:  Negative for confusion.     Physical Exam Updated Vital Signs BP (!) 148/89 (BP Location: Right Arm)   Pulse 86   Temp 99 F (37.2  C) (Oral)   Resp 16   Ht 5\' 3"  (1.6 m)   Wt 86.2 kg   LMP 09/04/2022 (Exact Date)   SpO2 100%   BMI 33.66 kg/m  Physical Exam  ED Results / Procedures / Treatments   Labs (all labs ordered are listed, but only abnormal results are displayed) Labs Reviewed  URINALYSIS, ROUTINE W REFLEX MICROSCOPIC - Abnormal; Notable for the following components:      Result Value   APPearance HAZY (*)    Leukocytes,Ua TRACE (*)    All other components within normal limits  POC URINE PREG, ED - Abnormal; Notable for the  following components:   Preg Test, Ur Positive (*)    All other components within normal limits    EKG None  Radiology No results found.  Procedures Procedures  {Document cardiac monitor, telemetry assessment procedure when appropriate:1}  Medications Ordered in ED Medications - No data to display  ED Course/ Medical Decision Making/ A&P   {   Click here for ABCD2, HEART and other calculatorsREFRESH Note before signing :1}                          Medical Decision Making Amount and/or Complexity of Data Reviewed Labs: ordered.   ***  {Document critical care time when appropriate:1} {Document review of labs and clinical decision tools ie heart score, Chads2Vasc2 etc:1}  {Document your independent review of radiology images, and any outside records:1} {Document your discussion with family members, caretakers, and with consultants:1} {Document social determinants of health affecting pt's care:1} {Document your decision making why or why not admission, treatments were needed:1} Final Clinical Impression(s) / ED Diagnoses Final diagnoses:  None    Rx / DC Orders ED Discharge Orders     None

## 2022-10-08 NOTE — ED Triage Notes (Signed)
Pt via POV requesting pregnancy test. She took a home test a few mins PTA and wants to confirm pregnancy. LMP 4/1, G2P1. No bleeding or abnormal symptoms, mild cramping noted. Pt also wants STD screening but denied STD symptoms.

## 2022-10-13 ENCOUNTER — Inpatient Hospital Stay (EMERGENCY_DEPARTMENT_HOSPITAL)
Admission: AD | Admit: 2022-10-13 | Discharge: 2022-10-13 | Disposition: A | Discharge status: Home / Self Care | Payer: Medicaid Other | Source: Home / Self Care | Attending: Obstetrics and Gynecology | Admitting: Obstetrics and Gynecology

## 2022-10-13 ENCOUNTER — Emergency Department (HOSPITAL_COMMUNITY)
Admission: EM | Admit: 2022-10-13 | Discharge: 2022-10-13 | Disposition: A | Discharge status: Home / Self Care | Payer: Medicaid Other | Attending: Emergency Medicine | Admitting: Emergency Medicine

## 2022-10-13 ENCOUNTER — Encounter (HOSPITAL_COMMUNITY): Payer: Self-pay | Admitting: Obstetrics and Gynecology

## 2022-10-13 ENCOUNTER — Encounter (HOSPITAL_COMMUNITY): Payer: Self-pay | Admitting: *Deleted

## 2022-10-13 ENCOUNTER — Inpatient Hospital Stay (HOSPITAL_COMMUNITY): Payer: Medicaid Other

## 2022-10-13 ENCOUNTER — Other Ambulatory Visit: Payer: Self-pay

## 2022-10-13 DIAGNOSIS — O10912 Unspecified pre-existing hypertension complicating pregnancy, second trimester: Secondary | ICD-10-CM | POA: Insufficient documentation

## 2022-10-13 DIAGNOSIS — O99891 Other specified diseases and conditions complicating pregnancy: Secondary | ICD-10-CM | POA: Insufficient documentation

## 2022-10-13 DIAGNOSIS — O98512 Other viral diseases complicating pregnancy, second trimester: Secondary | ICD-10-CM | POA: Insufficient documentation

## 2022-10-13 DIAGNOSIS — Z3A01 Less than 8 weeks gestation of pregnancy: Secondary | ICD-10-CM | POA: Insufficient documentation

## 2022-10-13 DIAGNOSIS — M545 Low back pain, unspecified: Secondary | ICD-10-CM | POA: Insufficient documentation

## 2022-10-13 DIAGNOSIS — O3680X0 Pregnancy with inconclusive fetal viability, not applicable or unspecified: Secondary | ICD-10-CM | POA: Insufficient documentation

## 2022-10-13 DIAGNOSIS — A6009 Herpesviral infection of other urogenital tract: Secondary | ICD-10-CM | POA: Insufficient documentation

## 2022-10-13 DIAGNOSIS — F17210 Nicotine dependence, cigarettes, uncomplicated: Secondary | ICD-10-CM | POA: Insufficient documentation

## 2022-10-13 DIAGNOSIS — O10919 Unspecified pre-existing hypertension complicating pregnancy, unspecified trimester: Secondary | ICD-10-CM | POA: Diagnosis present

## 2022-10-13 DIAGNOSIS — O10911 Unspecified pre-existing hypertension complicating pregnancy, first trimester: Secondary | ICD-10-CM | POA: Diagnosis not present

## 2022-10-13 DIAGNOSIS — K5900 Constipation, unspecified: Secondary | ICD-10-CM | POA: Insufficient documentation

## 2022-10-13 DIAGNOSIS — J069 Acute upper respiratory infection, unspecified: Secondary | ICD-10-CM | POA: Diagnosis not present

## 2022-10-13 DIAGNOSIS — O99612 Diseases of the digestive system complicating pregnancy, second trimester: Secondary | ICD-10-CM | POA: Diagnosis not present

## 2022-10-13 DIAGNOSIS — O98511 Other viral diseases complicating pregnancy, first trimester: Secondary | ICD-10-CM

## 2022-10-13 DIAGNOSIS — O26891 Other specified pregnancy related conditions, first trimester: Secondary | ICD-10-CM | POA: Insufficient documentation

## 2022-10-13 DIAGNOSIS — O98311 Other infections with a predominantly sexual mode of transmission complicating pregnancy, first trimester: Secondary | ICD-10-CM | POA: Insufficient documentation

## 2022-10-13 DIAGNOSIS — O99332 Smoking (tobacco) complicating pregnancy, second trimester: Secondary | ICD-10-CM | POA: Insufficient documentation

## 2022-10-13 DIAGNOSIS — R6889 Other general symptoms and signs: Secondary | ICD-10-CM

## 2022-10-13 DIAGNOSIS — O99511 Diseases of the respiratory system complicating pregnancy, first trimester: Secondary | ICD-10-CM | POA: Insufficient documentation

## 2022-10-13 LAB — CBC
HCT: 34.5 % — ABNORMAL LOW (ref 36.0–46.0)
Hemoglobin: 11.4 g/dL — ABNORMAL LOW (ref 12.0–15.0)
MCH: 28.6 pg (ref 26.0–34.0)
MCHC: 33 g/dL (ref 30.0–36.0)
MCV: 86.5 fL (ref 80.0–100.0)
Platelets: 326 10*3/uL (ref 150–400)
RBC: 3.99 MIL/uL (ref 3.87–5.11)
RDW: 14.2 % (ref 11.5–15.5)
WBC: 4 10*3/uL (ref 4.0–10.5)
nRBC: 0 % (ref 0.0–0.2)

## 2022-10-13 LAB — URINALYSIS, ROUTINE W REFLEX MICROSCOPIC
Bacteria, UA: NONE SEEN
Bilirubin Urine: NEGATIVE
Glucose, UA: NEGATIVE mg/dL
Hgb urine dipstick: NEGATIVE
Ketones, ur: NEGATIVE mg/dL
Leukocytes,Ua: NEGATIVE
Nitrite: NEGATIVE
Protein, ur: NEGATIVE mg/dL
Specific Gravity, Urine: 1.016 (ref 1.005–1.030)
pH: 7 (ref 5.0–8.0)

## 2022-10-13 LAB — COMPREHENSIVE METABOLIC PANEL
ALT: 25 U/L (ref 0–44)
AST: 21 U/L (ref 15–41)
Albumin: 3.5 g/dL (ref 3.5–5.0)
Alkaline Phosphatase: 63 U/L (ref 38–126)
Anion gap: 11 (ref 5–15)
BUN: <5 mg/dL — ABNORMAL LOW (ref 6–20)
CO2: 21 mmol/L — ABNORMAL LOW (ref 22–32)
Calcium: 8.6 mg/dL — ABNORMAL LOW (ref 8.9–10.3)
Chloride: 103 mmol/L (ref 98–111)
Creatinine, Ser: 0.63 mg/dL (ref 0.44–1.00)
GFR, Estimated: >60 mL/min (ref >60)
Glucose, Bld: 81 mg/dL (ref 70–99)
Potassium: 3.3 mmol/L — ABNORMAL LOW (ref 3.5–5.1)
Sodium: 135 mmol/L (ref 135–145)
Total Bilirubin: 0.2 mg/dL — ABNORMAL LOW (ref 0.3–1.2)
Total Protein: 7.1 g/dL (ref 6.5–8.1)

## 2022-10-13 LAB — WET PREP, GENITAL
Clue Cells Wet Prep HPF POC: NONE SEEN
Sperm: NONE SEEN
Trich, Wet Prep: NONE SEEN
WBC, Wet Prep HPF POC: <10 (ref <10)
Yeast Wet Prep HPF POC: NONE SEEN

## 2022-10-13 LAB — HCG, QUANTITATIVE, PREGNANCY: hCG, Beta Chain, Quant, S: 14303 m[IU]/mL — ABNORMAL HIGH (ref <5)

## 2022-10-13 LAB — PROTEIN / CREATININE RATIO, URINE
Creatinine, Urine: 191 mg/dL
Protein Creatinine Ratio: 0.07 mg/mg{Cre} (ref 0.00–0.15)
Total Protein, Urine: 14 mg/dL

## 2022-10-13 LAB — HIV ANTIBODY (ROUTINE TESTING W REFLEX): HIV Screen 4th Generation wRfx: NONREACTIVE

## 2022-10-13 LAB — POCT PREGNANCY, URINE: Preg Test, Ur: POSITIVE — AB

## 2022-10-13 MED ORDER — CYCLOBENZAPRINE HCL 10 MG PO TABS: 10.0000 mg | ORAL_TABLET | Freq: Two times a day (BID) | ORAL | 0 refills | Status: DC | PRN

## 2022-10-13 MED ORDER — ACETAMINOPHEN 500 MG PO TABS
1000.0000 mg | ORAL_TABLET | Freq: Once | ORAL | Status: AC
  Administered 2022-10-13: 1000 mg via ORAL
  Filled 2022-10-13: qty 2

## 2022-10-13 MED ORDER — ACETAMINOPHEN 500 MG PO TABS: 1000.0000 mg | ORAL_TABLET | Freq: Once | ORAL | Status: DC

## 2022-10-13 MED ORDER — NIFEDIPINE ER OSMOTIC RELEASE 30 MG PO TB24
30.0000 mg | ORAL_TABLET | Freq: Once | ORAL | Status: AC
  Administered 2022-10-13: 30 mg via ORAL
  Filled 2022-10-13: qty 1

## 2022-10-13 MED ORDER — NIFEDIPINE ER OSMOTIC RELEASE 30 MG PO TB24: 30.0000 mg | ORAL_TABLET | Freq: Every day | ORAL | 6 refills | Status: DC

## 2022-10-13 MED ORDER — VALACYCLOVIR HCL 1 G PO TABS: ORAL_TABLET | ORAL | 0 refills | Status: DC

## 2022-10-13 NOTE — MAU Note (Signed)
.  Donna Conrad is a 27 y.o. at Unknown here in MAU reporting: back pain that stated 3 days ago. Feels worse when she is laying down. Feels hot and has has a cough for a few days as well that is getting better. Has not had a BM. Was seen at [facility] this morning and was told there are no spinal issues but her back still hurts Has not taken anything for the pain or cough LMP: 09/04/2022 Onset of complaint: 3 days Pain score: 8 Vitals:   10/13/22 1002  BP: (!) 136/90  Pulse: 75  Resp: 18  Temp: 98 F (36.7 C)     FHT:n/a  Lab orders placed from triage:  u/a, UPT

## 2022-10-13 NOTE — ED Provider Notes (Signed)
[place] EMERGENCY DEPARTMENT AT [facility] Provider Note  CSN: [id number] Arrival date & time: 10/13/22 4098  Chief Complaint(s) Back Pain  HPI Donna Conrad is a 26 y.o. female G2, P1 without significant past medical history presenting to the emergency department with multiple complaints.  Patient reports intermittent constipation.  Still having bowel movements.  No fecal incontinence.  No abdominal pain.  She also reports 2 days of runny nose, cough.  No fevers or chills.  No productive cough.  No sore throat.  No headaches.  She also reports low back pain.  Reports it is mild.  Has not taken anything for the symptoms.  No bowel bladder incontinence.  No numbness or tingling.  No abdominal pain.  She further complains that she has not been able to sleep well.  She denies feeling tired.  She is reports that she does not feel the need to sleep as much.  No racing thoughts or drug use.  She also reports she is around [redacted] weeks pregnant, has not yet seen OB.  No lower abdominal pain, dysuria, vaginal bleeding or discharge, lightheadedness or dizziness, syncope.   Past Medical History Past Medical History:  Diagnosis Date   Medical history non-contributory    Pre-eclampsia    Patient Active Problem List   Diagnosis Date Noted   [redacted] weeks gestation of pregnancy    Preeclampsia 11/17/2014   Home Medication(s) Prior to Admission medications   Medication Sig Start Date End Date Taking? Authorizing Provider  cyclobenzaprine (FLEXERIL) 5 MG tablet Take 1 tablet (5 mg total) by mouth 3 (three) times daily as needed. 08/14/18   Devoria Albe, MD  etonogestrel (NEXPLANON) 68 MG IMPL implant Nexplanon 68 mg subdermal implant  Inject 1 implant by subcutaneous route. Patient not taking: Reported on 04/17/2021    [provider]  fluticasone (FLONASE) 50 MCG/ACT nasal spray Place 2 sprays into both nostrils daily. Patient not taking: Reported on 04/17/2021 11/25/17    Couture, Cortni S, PA-C  naproxen (NAPROSYN) 500 MG tablet Take 1 tablet (500 mg total) by mouth 2 (two) times daily with a meal. 03/08/19   Rancour, Jeannett Senior, MD  ondansetron (ZOFRAN ODT) 4 MG disintegrating tablet 4mg  ODT q4 hours prn nausea/vomit Patient not taking: Reported on 04/17/2021 04/16/21   Mesner, Barbara Cower, MD  ondansetron (ZOFRAN-ODT) 4 MG disintegrating tablet Take 1 tablet (4 mg total) by mouth every 8 (eight) hours as needed for nausea or vomiting. 11/09/21   Particia Nearing, PA-C  predniSONE (DELTASONE) 20 MG tablet Take 3 po QD x 3d , then 2 po QD x 3d then 1 po QD x 3d Patient not taking: Reported on 04/17/2021 08/14/18   Devoria Albe, MD  promethazine (PHENERGAN) 25 MG suppository Place 1 suppository (25 mg total) rectally every 6 (six) hours as needed for nausea or vomiting. 04/17/21   Eber Hong, MD  promethazine (PHENERGAN) 25 MG tablet Take 1 tablet (25 mg total) by mouth every 6 (six) hours as needed for nausea or vomiting. 04/17/21   Eber Hong, MD  valACYclovir (VALTREX) 500 MG tablet Take 1 tablet by mouth 2 (two) times daily as needed (outbreaks).  Patient not taking: Reported on 04/17/2021 02/01/17   [provider]  Past Surgical History Past Surgical History:  Procedure Laterality Date   CESAREAN SECTION N/A 11/19/2014   Procedure: CESAREAN SECTION;  Surgeon: Sherian Rein, MD;  Location: [facility] ORS;  Service: Obstetrics;  Laterality: N/A;   WISDOM TOOTH EXTRACTION     Family History Family History  Problem Relation Age of Onset   Asthma Mother    Diabetes Mother    Hypertension Mother    Miscarriages / [place] Mother    Stroke Mother    Asthma Father    Depression Father    Stroke Father    Diabetes Sister    Mental illness Sister    Miscarriages / Stillbirths Sister    Diabetes Brother     Social  History Social History   Tobacco Use   Smoking status: Some Days    Types: Cigarettes   Smokeless tobacco: Never   Tobacco comments:    Pt reports smoking only some days, but has stopped since finding out she was pregnant.   Vaping Use   Vaping Use: Never used  Substance Use Topics   Alcohol use: No   Drug use: Not Currently   Allergies Patient has no known allergies.  Review of Systems Review of Systems  All other systems reviewed and are negative.   Physical Exam Vital Signs  I have reviewed the triage vital signs BP (!) 145/86   Pulse 82   Temp 98.3 F (36.8 C) (Oral)   Resp 18   Ht 5\' 3"  (1.6 m)   Wt 86.2 kg   LMP 09/04/2022 (Exact Date)   SpO2 98%   BMI 33.66 kg/m  Physical Exam Vitals and nursing note reviewed.  Constitutional:      General: She is not in acute distress.    Appearance: She is well-developed.  HENT:     Head: Normocephalic and atraumatic.     Nose: Rhinorrhea present.     Mouth/Throat:     Mouth: Mucous membranes are moist.  Eyes:     Pupils: Pupils are equal, round, and reactive to light.  Cardiovascular:     Rate and Rhythm: Normal rate and regular rhythm.     Heart sounds: No murmur heard. Pulmonary:     Effort: Pulmonary effort is normal. No respiratory distress.     Breath sounds: Normal breath sounds.  Abdominal:     General: Abdomen is flat.     Palpations: Abdomen is soft.     Tenderness: There is no abdominal tenderness.  Musculoskeletal:        General: No tenderness.     Right lower leg: No edema.     Left lower leg: No edema.     Comments: No midline or paraspinal C, T, L-spine tenderness.  Skin:    General: Skin is warm and dry.  Neurological:     General: No focal deficit present.     Mental Status: She is alert. Mental status is at baseline.     Comments: Strength 5 out of 5 in the bilateral lower extremities, normal gait, 2+ patellar reflexes bilaterally  Psychiatric:        Mood and Affect: Mood normal.         Behavior: Behavior normal.     ED Results and Treatments Labs (all labs ordered are listed, but only abnormal results are displayed) Labs Reviewed  URINALYSIS, W/ REFLEX TO CULTURE (INFECTION SUSPECTED)  PREGNANCY, URINE  Radiology No results found.  Pertinent labs & imaging results that were available during my care of the patient were reviewed by me and considered in my medical decision making (see MDM for details).  Medications Ordered in ED Medications  acetaminophen (TYLENOL) tablet 1,000 mg (has no administration in time range)                                                                                                                                     Procedures Procedures  (including critical care time)  Medical Decision Making / ED Course   MDM:  27 year old female presenting to the emergency department with multiple complaints.  Patient is complaining of runny nose and cough.  Suspect viral URI.  Lungs clear on exam.  Patient well-appearing without fever.  She also reports sleeping less.  She is not actually tired.  She denies drug use.  She has not had pressured speech or racing thoughts.  Unclear cause of this but could be pregnancy hormonal changes.  No headaches.  Also reports low back pain.  Reports midline pain without fevers.  No red flags for spinal cord compression or cauda equina syndrome.  No fevers to suggest occult infectious process.  Neurologic exam is reassuring.  Suspect probably muscle strain related to the pregnancy.  Will check urinalysis but low concern for UTI with no dysuria.  She also reports mild intermittent constipation.  She has no abdominal tenderness, nausea or vomiting to suggest alternative processes of small bowel obstruction.  Doubt this is related to her back pain, no fecal incontinence or Urinary  incontinence.  Very low concern for pregnancy related complication such as ectopic pregnancy without abdominal pain or vaginal bleeding.     Planned to check urine pregnancy, urinalysis, give Tylenol for back pain.  Seems as if patient eloped prior to this       Medicines ordered and prescription drug management: Meds ordered this encounter  Medications   acetaminophen (TYLENOL) tablet 1,000 mg    -I have reviewed the patients home medicines and have made adjustments as needed  Co morbidities that complicate the patient evaluation  Past Medical History:  Diagnosis Date   Medical history non-contributory    Pre-eclampsia       Dispostion: Disposition decision including need for hospitalization was considered, and patient Eloped    Final Clinical Impression(s) / ED Diagnoses Final diagnoses:  Multiple complaints  Acute midline low back pain without sciatica  Upper respiratory tract infection, unspecified type     This chart was dictated using voice recognition software.  Despite best efforts to proofread,  errors can occur which can change the documentation meaning.    Lonell Grandchild, MD 10/13/22 [phone]

## 2022-10-13 NOTE — MAU Provider Note (Signed)
History     CSN: [id number]  Arrival date and time: 10/13/22 0900   Event Date/Time   First Provider Initiated Contact with Patient 10/13/22 1134      Chief Complaint  Patient presents with   Back Pain   HPI [name] is a 27 y.o. year old G75P0101 female at [redacted]w[redacted]d weeks gestation who presents to MAU reporting mid-back pain x 3 days. She reports that laying down makes the pain worse. She has not taken any medications for her sx's. She was seen at [facility] this morning, but left before they could administer any medications. She states she was told "there were no spinal issues," but she still has back pain. She also reports she noticed her BP was elevated at [facility] and "no one said or did anything about it while I was at [facility]." She plans to receive [facility] with [facility]; next appt is 10/27/2022.    OB History     Gravida  2   Para  1   Term      Preterm  1   AB      Living  1      SAB      IAB      Ectopic      Multiple  0   Live Births  1           Past Medical History:  Diagnosis Date   Medical history non-contributory    Pre-eclampsia     Past Surgical History:  Procedure Laterality Date   CESAREAN SECTION N/A 11/19/2014   Procedure: CESAREAN SECTION;  Surgeon: Sherian Rein, MD;  Location: [facility];  Service: Obstetrics;  Laterality: N/A;   WISDOM TOOTH EXTRACTION      Family History  Problem Relation Age of Onset   Asthma Mother    Diabetes Mother    Hypertension Mother    Miscarriages / [place] Mother    Stroke Mother    Asthma Father    Depression Father    Stroke Father    Diabetes Sister    Mental illness Sister    Miscarriages / Stillbirths Sister    Diabetes Brother     Social History   Tobacco Use   Smoking status: Some Days    Types: Cigarettes   Smokeless tobacco: Never   Tobacco comments:    Pt reports smoking only some days, but has stopped since finding out she was pregnant.   Vaping Use    Vaping Use: Never used  Substance Use Topics   Alcohol use: No   Drug use: Not Currently    Allergies: No Known Allergies  Medications Prior to Admission  Medication Sig Dispense Refill Last Dose   [DISCONTINUED] valACYclovir (VALTREX) 500 MG tablet Take 1 tablet by mouth 2 (two) times daily as needed (outbreaks).  0 Past Week   cyclobenzaprine (FLEXERIL) 5 MG tablet Take 1 tablet (5 mg total) by mouth 3 (three) times daily as needed. (Patient not taking: Reported on 10/13/2022) 30 tablet 0 Not Taking   etonogestrel (NEXPLANON) 68 MG IMPL implant Nexplanon 68 mg subdermal implant  Inject 1 implant by subcutaneous route. (Patient not taking: Reported on 04/17/2021)      fluticasone (FLONASE) 50 MCG/ACT nasal spray Place 2 sprays into both nostrils daily. (Patient not taking: Reported on 04/17/2021) 16 g 0    naproxen (NAPROSYN) 500 MG tablet Take 1 tablet (500 mg total) by mouth 2 (two) times daily with a  meal. 30 tablet 0    ondansetron (ZOFRAN ODT) 4 MG disintegrating tablet 4mg  ODT q4 hours prn nausea/vomit (Patient not taking: Reported on 04/17/2021) 30 tablet 0    ondansetron (ZOFRAN-ODT) 4 MG disintegrating tablet Take 1 tablet (4 mg total) by mouth every 8 (eight) hours as needed for nausea or vomiting. (Patient not taking: Reported on 10/13/2022) 20 tablet 0 Not Taking   predniSONE (DELTASONE) 20 MG tablet Take 3 po QD x 3d , then 2 po QD x 3d then 1 po QD x 3d (Patient not taking: Reported on 04/17/2021) 18 tablet 0    promethazine (PHENERGAN) 25 MG suppository Place 1 suppository (25 mg total) rectally every 6 (six) hours as needed for nausea or vomiting. 12 each 0    promethazine (PHENERGAN) 25 MG tablet Take 1 tablet (25 mg total) by mouth every 6 (six) hours as needed for nausea or vomiting. (Patient not taking: Reported on 10/13/2022) 12 tablet 0 Not Taking    Review of Systems  Constitutional: Negative.   HENT: Negative.    Eyes: Negative.   Respiratory: Negative.     Cardiovascular: Negative.   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  Endocrine: Negative.   Genitourinary: Negative.   Musculoskeletal:  Positive for back pain (middle of back just below bra strap; "it feels like a lump is there.").  Skin: Negative.   Allergic/Immunologic: Negative.   Neurological: Negative.   Hematological: Negative.   Psychiatric/Behavioral: Negative.     Physical Exam   Blood pressure 133/74, pulse 75, temperature 98 F (36.7 C), resp. rate 18, height 5\' 3"  (1.6 m), weight 85.7 kg, last menstrual period 09/04/2022.  Physical Exam Vitals and nursing note reviewed.  Constitutional:      Appearance: Normal appearance. She is obese.  Cardiovascular:     Rate and Rhythm: Normal rate.  Pulmonary:     Effort: Pulmonary effort is normal.  Abdominal:     Palpations: Abdomen is soft.  Genitourinary:    Comments: Blind swab for wet prep & GC/CT Musculoskeletal:        General: Normal range of motion.  Skin:    General: Skin is warm and dry.  Neurological:     Mental Status: She is alert and oriented to person, place, and time.  Psychiatric:        Mood and Affect: Mood normal.        Behavior: Behavior normal.        Thought Content: Thought content normal.        Judgment: Judgment normal.    MAU Course  Procedures  MDM CCUA UPT CBC ABO/Rh HCG Wet Prep GC/CT -- pending HIV -- pending OB < 14 wks [place] with TV CMP P/C Ratio Serial BP's   Results for orders placed or performed during the hospital encounter of 10/13/22 (from the past 24 hour(s))  Pregnancy, urine POC     Status: Abnormal   Collection Time: 10/13/22  9:20 AM  Result Value Ref Range   Preg Test, Ur POSITIVE (A) NEGATIVE  Urinalysis, Routine w reflex microscopic -Urine, Clean Catch     Status: Abnormal   Collection Time: 10/13/22  9:27 AM  Result Value Ref Range   Color, Urine YELLOW YELLOW   APPearance HAZY (A) CLEAR    Specific Gravity, Urine 1.016 1.005 - 1.030   pH 7.0 5.0 - 8.0   Glucose, UA NEGATIVE NEGATIVE mg/dL   Hgb urine  dipstick NEGATIVE NEGATIVE   Bilirubin Urine NEGATIVE NEGATIVE   Ketones, ur NEGATIVE NEGATIVE mg/dL   Protein, ur NEGATIVE NEGATIVE mg/dL   Nitrite NEGATIVE NEGATIVE   Leukocytes,Ua NEGATIVE NEGATIVE   RBC / HPF 0-5 0 - 5 RBC/hpf   WBC, UA 0-5 0 - 5 WBC/hpf   Bacteria, UA NONE SEEN NONE SEEN   Squamous Epithelial / HPF 11-20 0 - 5 /HPF   Mucus PRESENT   Wet prep, genital     Status: None   Collection Time: 10/13/22 11:58 AM  Result Value Ref Range   Yeast Wet Prep HPF POC NONE SEEN NONE SEEN   Trich, Wet Prep NONE SEEN NONE SEEN   Clue Cells Wet Prep HPF POC NONE SEEN NONE SEEN   WBC, Wet Prep HPF POC <10 <10   Sperm NONE SEEN   HIV Antibody (routine testing w rflx)     Status: None   Collection Time: 10/13/22 12:32 PM  Result Value Ref Range   HIV Screen 4th Generation wRfx Non Reactive Non Reactive  CBC     Status: Abnormal   Collection Time: 10/13/22 12:32 PM  Result Value Ref Range   WBC 4.0 4.0 - 10.5 K/uL   RBC 3.99 3.87 - 5.11 MIL/uL   Hemoglobin 11.4 (L) 12.0 - 15.0 g/dL   HCT 95.2 (L) 84.1 - 32.4 %   MCV 86.5 80.0 - 100.0 fL   MCH 28.6 26.0 - 34.0 pg   MCHC 33.0 30.0 - 36.0 g/dL   RDW 40.1 02.7 - 25.3 %   Platelets 326 150 - 400 K/uL   nRBC 0.0 0.0 - 0.2 %  hCG, quantitative, pregnancy     Status: Abnormal   Collection Time: 10/13/22 12:32 PM  Result Value Ref Range   hCG, Beta Chain, Quant, S 14,303 (H) <5 mIU/mL  Comprehensive metabolic panel     Status: Abnormal   Collection Time: 10/13/22 12:32 PM  Result Value Ref Range   Sodium 135 135 - 145 mmol/L   Potassium 3.3 (L) 3.5 - 5.1 mmol/L   Chloride 103 98 - 111 mmol/L   CO2 21 (L) 22 - 32 mmol/L   Glucose, Bld 81 70 - 99 mg/dL   BUN <5 (L) 6 - 20 mg/dL   Creatinine, Ser 6.64 0.44 - 1.00 mg/dL   Calcium 8.6 (L) 8.9 - 10.3 mg/dL   Total Protein 7.1 6.5 - 8.1 g/dL   Albumin 3.5 3.5 - 5.0  g/dL   AST 21 15 - 41 U/L   ALT 25 0 - 44 U/L   Alkaline Phosphatase 63 38 - 126 U/L   Total Bilirubin 0.2 (L) 0.3 - 1.2 mg/dL   GFR, Estimated >40 >34 mL/min   Anion gap 11 5 - 15  Protein / creatinine ratio, urine     Status: None   Collection Time: 10/13/22  1:10 PM  Result Value Ref Range   Creatinine, Urine 191 mg/dL   Total Protein, Urine 14 mg/dL   Protein Creatinine Ratio 0.07 0.00 - 0.15 mg/mg[Cre]    US OB LESS THAN 14 WEEKS WITH OB TRANSVAGINAL  Result Date: 10/13/2022 CLINICAL DATA:  Back pain.  Positive pregnancy test. EXAM: OBSTETRIC <14 WK [place] AND TRANSVAGINAL OB US TECHNIQUE: Both transabdominal and transvaginal ultrasound examinations were performed for complete evaluation of the gestation as well as the maternal uterus, adnexal regions, and pelvic cul-de-sac. Transvaginal technique was performed to assess early pregnancy. COMPARISON:  None Available. FINDINGS: Intrauterine gestational sac:  Single Yolk sac:  Visualized Embryo:  Not visualized Cardiac Activity: Not visualized Heart Rate: N/A  bpm MSD: 9.9 mm   5 w   5 d Subchorionic hemorrhage:  None visualized. Maternal uterus/adnexae: Maternal ovaries unremarkable. IMPRESSION: Single intrauterine gestational sac at estimated 5 week 5 day gestational age by mean sac diameter. Yolk sac visualized but no embryo yet evident. Electronically Signed   By: Kennith Center M.D.   On: 10/13/2022 13:12     Assessment and Plan  1. Pregnancy with uncertain fetal viability, single or unspecified fetus - F/U U/S in 7-10 days - U/S scheduled for 10/27/22  2. Chronic hypertension affecting pregnancy - Information provided on HTN in pregnancy - Rx: Procardia XL 30 mg po daily - BP check at next appt   3. Back pain affecting pregnancy in first trimester - Information provided on back pain in pregnancy - Rx: Flexeril 10 mg BID prn pain   4. 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 [redacted] weeks gestation of pregnancy   - Discharge patient - Keep scheduled appt with FT on 10/27/22 - Patient verbalized an understanding of the plan of care and agrees.   Raelyn Mora, CNM 10/13/2022, 11:34 AM

## 2022-10-13 NOTE — MAU Note (Signed)
Spoke with patient about her current BP. She informed me that she has had issues with her BP since her last child in 2016. She is not currently taken any medications and was only told by her dentist that she has high BP. In which they would refuse care since it was untreated.

## 2022-10-13 NOTE — ED Triage Notes (Signed)
Pt arrived POV with multiple complaints, main one being low mid back pain x 2 days. Pt also c/o difficulty sleeping x 3 days, cough x 2 days, constipation x 1 week, and woke up today with subjective fever. Pt reports she is [redacted] weeks pregnant, will be seeing [facility] on 5/23.

## 2022-10-13 NOTE — ED Notes (Signed)
Pt came to desk stating she is going to a different hospital because she thought she would get an xray to see if she was constipated. Offered x2 to get dr to come talk to her. Pt refused and stated she was leaving. Advised of risk leaving.

## 2022-10-16 LAB — GC/CHLAMYDIA PROBE AMP (~~LOC~~) NOT AT ARMC
Chlamydia: NEGATIVE
Comment: NEGATIVE
Comment: NORMAL
Neisseria Gonorrhea: NEGATIVE

## 2022-10-23 ENCOUNTER — Ambulatory Visit
Admission: EM | Admit: 2022-10-23 | Discharge: 2022-10-23 | Disposition: A | Discharge status: Home / Self Care | Payer: Medicaid Other | Attending: Nurse Practitioner | Admitting: Nurse Practitioner

## 2022-10-23 DIAGNOSIS — Z3A01 Less than 8 weeks gestation of pregnancy: Secondary | ICD-10-CM

## 2022-10-23 DIAGNOSIS — R112 Nausea with vomiting, unspecified: Secondary | ICD-10-CM

## 2022-10-23 DIAGNOSIS — O161 Unspecified maternal hypertension, first trimester: Secondary | ICD-10-CM | POA: Diagnosis not present

## 2022-10-23 MED ORDER — DOXYLAMINE-PYRIDOXINE 10-10 MG PO TBEC: DELAYED_RELEASE_TABLET | ORAL | 0 refills | Status: DC

## 2022-10-23 NOTE — Discharge Instructions (Addendum)
Take medication as prescribed. You may take over-the-counter Tylenol as needed for pain, fever, or general discomfort. Increase your fluid intake.  Make sure you are drinking at least 8-10 8 ounce glasses of water daily. Make sure you avoid foods that are high in sodium such as pork and processed foods.  I am providing some information for you to review regarding hypertension during pregnancy. Recommend a brat diet while nausea and vomiting persist.  This includes bananas, rice, applesauce, and toast. Also recommend Pedialyte or Gatorolyte to prevent dehydration. If symptoms worsen before your obstetrician appointment on 10/27/2022, please go to the [facility] for further evaluation. Keep appointment with obstetrician she is scheduled for this Friday. Follow-up as needed.

## 2022-10-23 NOTE — ED Triage Notes (Signed)
Pt complains of nausea, headache, vomiting that started 2-3 days ago. Pt is [redacted] weeks pregnant and has first OB appointment 10/27/22.

## 2022-10-23 NOTE — ED Provider Notes (Signed)
[facility]    CSN: [id number] Arrival date & time: 10/23/22  1342      History   Chief Complaint Chief Complaint  Patient presents with   Headache   Nausea    HPI Donna Conrad is a 27 y.o. female.   The history is provided by the patient.   The patient presents for complaints of headache, nausea, and vomiting, that is been present for the past 3 to 4 days.  Patient reports that she is approximately [redacted] weeks pregnant.  Patient states that her head pain worsens after she vomits.  Patient states she vomited 3 times today.  Patient states that she does have a history of hypertension.  She was recently started on nifedipine on 10/13/2022.  Patient states she is waiting to see the obstetrician.  She is scheduled for 10/27/2022.  Patient denies dizziness, lightheadedness, or blurred vision.  She reports that she has been taking her blood pressure medicine accordingly.  She states that she is drinking approximately 2-3 bottles of water each day.  Patient also states that this morning for breakfast, she had sausage.  She has not taken any medication for her headache as she states that she is unsure as to what she can take.  Past Medical History:  Diagnosis Date   Medical history non-contributory    Pre-eclampsia     Patient Active Problem List   Diagnosis Date Noted   Chronic hypertension affecting pregnancy 10/13/2022   [redacted] weeks gestation of pregnancy    Preeclampsia 11/17/2014    Past Surgical History:  Procedure Laterality Date   CESAREAN SECTION N/A 11/19/2014   Procedure: CESAREAN SECTION;  Surgeon: Sherian Rein, MD;  Location: [facility];  Service: Obstetrics;  Laterality: N/A;   WISDOM TOOTH EXTRACTION      OB History     Gravida  2   Para  1   Term      Preterm  1   AB      Living  1      SAB      IAB      Ectopic      Multiple  0   Live Births  1            Home Medications    Prior to Admission medications   Medication  Sig Start Date End Date Taking? Authorizing Provider  Doxylamine-Pyridoxine 10-10 MG TBEC Take 2 tablets at bedtime by mouth as needed for nausea for 2 days.  If symptoms continue to persist after 2 days, begin taking 1 tablet by mouth in the morning and 2 tablets at bedtime as needed. 10/23/22  Yes Simon Aaberg-Warren, Sadie Haber, NP  NIFEdipine (PROCARDIA XL) 30 MG 24 hr tablet Take 1 tablet (30 mg total) by mouth daily. 10/13/22  Yes Raelyn Mora, CNM  valACYclovir (VALTREX) 1000 MG tablet Take 1/2 pill once daily. Take 1/2 pill twice daily for OUTBREAKS. 10/13/22  Yes Raelyn Mora, CNM  cyclobenzaprine (FLEXERIL) 10 MG tablet Take 1 tablet (10 mg total) by mouth 2 (two) times daily as needed for muscle spasms. 10/13/22   Raelyn Mora, CNM  fluticasone (FLONASE) 50 MCG/ACT nasal spray Place 2 sprays into both nostrils daily. Patient not taking: Reported on 04/17/2021 11/25/17   Karrie Meres, PA-C    Family History Family History  Problem Relation Age of Onset   Asthma Mother    Diabetes Mother    Hypertension Mother    Miscarriages / [place] Mother  Stroke Mother    Asthma Father    Depression Father    Stroke Father    Diabetes Sister    Mental illness Sister    Miscarriages / Stillbirths Sister    Diabetes Brother     Social History Social History   Tobacco Use   Smoking status: Some Days    Types: Cigarettes   Smokeless tobacco: Never   Tobacco comments:    Pt reports smoking only some days, but has stopped since finding out she was pregnant.   Vaping Use   Vaping Use: Never used  Substance Use Topics   Alcohol use: No   Drug use: Not Currently     Allergies   Patient has no known allergies.   Review of Systems Review of Systems Per HPI  Physical Exam Triage Vital Signs ED Triage Vitals [10/23/22 1408]  Enc Vitals Group     BP (!) 142/89     Pulse Rate 90     Resp 18     Temp 98 F (36.7 C)     Temp Source Oral     SpO2 99 %     Weight       Height      Head Circumference      Peak Flow      Pain Score 9     Pain Loc      Pain Edu?      Excl. in GC?    No data found.  Updated Vital Signs BP (!) 142/89 (BP Location: Right Arm)   Pulse 90   Temp 98 F (36.7 C) (Oral)   Resp 18   LMP 09/04/2022 (Exact Date)   SpO2 99%   Visual Acuity Right Eye Distance:   Left Eye Distance:   Bilateral Distance:    Right Eye Near:   Left Eye Near:    Bilateral Near:     Physical Exam Vitals and nursing note reviewed.  Constitutional:      General: She is not in acute distress.    Appearance: She is well-developed.  HENT:     Head: Normocephalic.     Right Ear: Tympanic membrane, ear canal and external ear normal.     Left Ear: Tympanic membrane, ear canal and external ear normal.     Nose: Nose normal.     Mouth/Throat:     Mouth: Mucous membranes are moist.  Eyes:     Extraocular Movements: Extraocular movements intact.     Pupils: Pupils are equal, round, and reactive to light.  Cardiovascular:     Rate and Rhythm: Normal rate and regular rhythm.     Pulses: Normal pulses.     Heart sounds: Normal heart sounds.  Pulmonary:     Effort: Pulmonary effort is normal.     Breath sounds: Normal breath sounds.  Abdominal:     General: Bowel sounds are normal.     Palpations: Abdomen is soft.     Tenderness: There is no abdominal tenderness.  Musculoskeletal:     Cervical back: Normal range of motion.  Skin:    General: Skin is warm and dry.  Neurological:     General: No focal deficit present.     Mental Status: She is alert and oriented to person, place, and time.     GCS: GCS eye subscore is 4. GCS verbal subscore is 5. GCS motor subscore is 6.     Cranial Nerves: Cranial nerves 2-12 are intact.  Sensory: Sensation is intact.     Motor: Motor function is intact.     Coordination: Coordination is intact.     Gait: Gait is intact.  Psychiatric:        Mood and Affect: Mood normal.        Behavior: Behavior  normal.      UC Treatments / Results  Labs (all labs ordered are listed, but only abnormal results are displayed) Labs Reviewed - No data to display  EKG   Radiology No results found.  Procedures Procedures (including critical care time)  Medications Ordered in UC Medications - No data to display  Initial Impression / Assessment and Plan / UC Course  I have reviewed the triage vital signs and the nursing notes.  Pertinent labs & imaging results that were available during my care of the patient were reviewed by me and considered in my medical decision making (see chart for details).  The patient is well-appearing, she is in no acute distress, she is hypertensive, but vital signs are otherwise stable.  Suspect patient's headache is most likely associated with her elevated blood pressure.  Nausea and vomiting most likely due to her pregnancy.  Patient was advised to begin over-the-counter Tylenol for headache pain or discomfort.  Patient was prescribed diplegia's-pyridoxine 10-10mg  tablets for nausea.  Supportive care recommendations were provided and discussed with the patient to include increasing her fluid intake, avoiding foods that may elevate her blood pressure, and fluid replacement with Pedialyte or Gatorolyte.  Patient was given indications of when follow-up at the [facility] would be necessary.  Patient advised to keep appointment scheduled with obstetrician for this week.  Patient is in agreement with this plan of care and verbalizes understanding.  All questions were answered.  Patient stable for discharge.   Final Clinical Impressions(s) / UC Diagnoses   Final diagnoses:  Elevated blood pressure affecting pregnancy in first trimester, antepartum  Nausea and vomiting, unspecified vomiting type     Discharge Instructions      Take medication as prescribed. You may take over-the-counter Tylenol as needed for pain, fever, or general discomfort. Increase your  fluid intake.  Make sure you are drinking at least 8-10 8 ounce glasses of water daily. Make sure you avoid foods that are high in sodium such as pork and processed foods.  I am providing some information for you to review regarding hypertension during pregnancy. Recommend a brat diet while nausea and vomiting persist.  This includes bananas, rice, applesauce, and toast. Also recommend Pedialyte or Gatorolyte to prevent dehydration. If symptoms worsen before your obstetrician appointment on 10/27/2022, please go to the [facility] for further evaluation. Keep appointment with obstetrician she is scheduled for this Friday. Follow-up as needed.     ED Prescriptions     Medication Sig Dispense Auth. Provider   Doxylamine-Pyridoxine 10-10 MG TBEC Take 2 tablets at bedtime by mouth as needed for nausea for 2 days.  If symptoms continue to persist after 2 days, begin taking 1 tablet by mouth in the morning and 2 tablets at bedtime as needed. 30 tablet Ryn Peine-Warren, Sadie Haber, NP      PDMP not reviewed this encounter.   Abran Cantor, NP 10/23/22 1457

## 2022-10-26 ENCOUNTER — Other Ambulatory Visit: Payer: Self-pay | Admitting: Obstetrics & Gynecology

## 2022-10-26 DIAGNOSIS — O3680X0 Pregnancy with inconclusive fetal viability, not applicable or unspecified: Secondary | ICD-10-CM

## 2022-10-27 ENCOUNTER — Ambulatory Visit (INDEPENDENT_AMBULATORY_CARE_PROVIDER_SITE_OTHER): Payer: Medicaid Other

## 2022-10-27 ENCOUNTER — Ambulatory Visit (INDEPENDENT_AMBULATORY_CARE_PROVIDER_SITE_OTHER): Payer: Medicaid Other | Admitting: *Deleted

## 2022-10-27 ENCOUNTER — Ambulatory Visit: Payer: Medicaid Other

## 2022-10-27 VITALS — BP 142/98 | HR 84

## 2022-10-27 DIAGNOSIS — Z3A01 Less than 8 weeks gestation of pregnancy: Secondary | ICD-10-CM | POA: Diagnosis not present

## 2022-10-27 DIAGNOSIS — O10911 Unspecified pre-existing hypertension complicating pregnancy, first trimester: Secondary | ICD-10-CM

## 2022-10-27 DIAGNOSIS — O10919 Unspecified pre-existing hypertension complicating pregnancy, unspecified trimester: Secondary | ICD-10-CM

## 2022-10-27 DIAGNOSIS — O3680X0 Pregnancy with inconclusive fetal viability, not applicable or unspecified: Secondary | ICD-10-CM

## 2022-10-27 DIAGNOSIS — Z3481 Encounter for supervision of other normal pregnancy, first trimester: Secondary | ICD-10-CM | POA: Diagnosis not present

## 2022-10-27 MED ORDER — NIFEDIPINE ER OSMOTIC RELEASE 60 MG PO TB24: 60.0000 mg | ORAL_TABLET | Freq: Every day | ORAL | 6 refills | Status: DC

## 2022-10-27 NOTE — Progress Notes (Signed)
[place] 7+4 wks,single IUP with yolk sac,FHR 140 bpm,normal ovaries,CRL 9.8 mm

## 2022-10-27 NOTE — Progress Notes (Signed)
   NURSE VISIT- BLOOD PRESSURE CHECK  SUBJECTIVE:  Donna Conrad is a 27 y.o. G8P0101 female here for BP check. She is [redacted]w[redacted]d pregnant    HYPERTENSION ROS:  Pregnant:  Severe headaches that don't go away with tylenol/other medicines: No  Visual changes (seeing spots/double/blurred vision) No  Severe pain under right breast breast or in center of upper chest No  Severe nausea/vomiting No  Taking medicines as instructed yes   OBJECTIVE:  BP (!) 142/98 (BP Location: Right Arm, Patient Position: Sitting, Cuff Size: Normal)   Pulse 84   LMP 09/04/2022 (Exact Date)   Appearance alert, well appearing, and in no distress.  ASSESSMENT: Pregnancy [redacted]w[redacted]d  blood pressure check  PLAN: Discussed with Dr. Despina Hidden   Recommendations: Will increase Procardia to 60mg  daily Follow-up: as scheduled   Jobe Marker  10/27/2022 11:54 AM

## 2022-11-27 ENCOUNTER — Encounter: Payer: Medicaid Other | Admitting: Advanced Practice Midwife

## 2022-11-27 ENCOUNTER — Encounter: Payer: Medicaid Other | Admitting: *Deleted

## 2022-11-30 ENCOUNTER — Encounter: Payer: Self-pay | Admitting: Family Medicine

## 2022-11-30 DIAGNOSIS — O0990 Supervision of high risk pregnancy, unspecified, unspecified trimester: Secondary | ICD-10-CM | POA: Insufficient documentation

## 2022-11-30 DIAGNOSIS — Z8759 Personal history of other complications of pregnancy, childbirth and the puerperium: Secondary | ICD-10-CM | POA: Insufficient documentation

## 2022-11-30 DIAGNOSIS — O09299 Supervision of pregnancy with other poor reproductive or obstetric history, unspecified trimester: Secondary | ICD-10-CM | POA: Insufficient documentation

## 2022-12-01 ENCOUNTER — Encounter: Payer: Self-pay | Admitting: Family Medicine

## 2022-12-01 ENCOUNTER — Ambulatory Visit (INDEPENDENT_AMBULATORY_CARE_PROVIDER_SITE_OTHER): Payer: Medicaid Other | Admitting: Family Medicine

## 2022-12-01 ENCOUNTER — Encounter: Payer: Medicaid Other | Admitting: *Deleted

## 2022-12-01 VITALS — BP 122/86 | HR 85 | Wt 182.0 lb

## 2022-12-01 DIAGNOSIS — O10919 Unspecified pre-existing hypertension complicating pregnancy, unspecified trimester: Secondary | ICD-10-CM

## 2022-12-01 DIAGNOSIS — O10911 Unspecified pre-existing hypertension complicating pregnancy, first trimester: Secondary | ICD-10-CM

## 2022-12-01 DIAGNOSIS — O0991 Supervision of high risk pregnancy, unspecified, first trimester: Secondary | ICD-10-CM

## 2022-12-01 DIAGNOSIS — O09291 Supervision of pregnancy with other poor reproductive or obstetric history, first trimester: Secondary | ICD-10-CM

## 2022-12-01 DIAGNOSIS — Z6833 Body mass index (BMI) 33.0-33.9, adult: Secondary | ICD-10-CM

## 2022-12-01 DIAGNOSIS — O34219 Maternal care for unspecified type scar from previous cesarean delivery: Secondary | ICD-10-CM | POA: Insufficient documentation

## 2022-12-01 DIAGNOSIS — Z3A12 12 weeks gestation of pregnancy: Secondary | ICD-10-CM

## 2022-12-01 DIAGNOSIS — Z131 Encounter for screening for diabetes mellitus: Secondary | ICD-10-CM | POA: Diagnosis not present

## 2022-12-01 DIAGNOSIS — O09299 Supervision of pregnancy with other poor reproductive or obstetric history, unspecified trimester: Secondary | ICD-10-CM

## 2022-12-01 MED ORDER — BLOOD PRESSURE MONITOR MISC
0 refills | Status: AC
Start: 2022-12-01 — End: ?

## 2022-12-01 MED ORDER — ASPIRIN 81 MG PO TBEC: 162.0000 mg | DELAYED_RELEASE_TABLET | Freq: Every day | ORAL | 2 refills | Status: DC

## 2022-12-01 MED ORDER — NIFEDIPINE ER OSMOTIC RELEASE 60 MG PO TB24
60.0000 mg | ORAL_TABLET | Freq: Every day | ORAL | 6 refills | Status: DC
Start: 2022-12-01 — End: 2023-07-18

## 2022-12-01 NOTE — Progress Notes (Signed)
INITIAL PRENATAL VISIT  Subjective:   Donna Conrad is being seen today for her first obstetrical visit.  This is a planned pregnancy. This is a desired pregnancy.  She is at [redacted]w[redacted]d gestation by LMP/5w Her obstetrical history is significant for pre-eclampsia with HELLP with delivery at 30 weeks.  Patient does intend to breast feed. Pregnancy history fully reviewed.  Patient reports no complaints.  Indications for ASA therapy (per uptodate) One of the following: Previous pregnancy with preeclampsia, especially early onset and with an adverse outcome Yes Multifetal gestation No Chronic hypertension No Type 1 or 2 diabetes mellitus No Chronic kidney disease No Autoimmune disease (antiphospholipid syndrome, systemic lupus erythematosus) No  Indications for early GDM screening  First-degree relative with diabetes Yes BMI >30kg/m2 Yes Age > 25 Yes  Early screening tests: A1C   Review of Systems:   Review of Systems  Objective:    Obstetric History OB History  Gravida Para Term Preterm AB Living  2 1   1   1   SAB IAB Ectopic Multiple Live Births        0 1    # Outcome Date GA Lbr Len/2nd Weight Sex Delivery Anes PTL Lv  2 Current           1 Preterm 11/19/14 [redacted]w[redacted]d  3 lb 5.6 oz (1.52 kg) F CS-LTranv Spinal  LIV     Complications: Severe pre-eclampsia, HELLP syndrome    Past Medical History:  Diagnosis Date   HSV infection    Pre-eclampsia     Past Surgical History:  Procedure Laterality Date   CESAREAN SECTION N/A 11/19/2014   Procedure: CESAREAN SECTION;  Surgeon: Sherian Rein, MD;  Location: [facility];  Service: Obstetrics;  Laterality: N/A;   WISDOM TOOTH EXTRACTION      Current Outpatient Medications on File Prior to Visit  Medication Sig Dispense Refill   Prenatal Vit-Fe Fumarate-FA (PRENATAL VITAMIN PO) Take by mouth.     valACYclovir (VALTREX) 1000 MG tablet Take 1/2 pill once daily. Take 1/2 pill twice daily for OUTBREAKS. 30 tablet 0    cyclobenzaprine (FLEXERIL) 10 MG tablet Take 1 tablet (10 mg total) by mouth 2 (two) times daily as needed for muscle spasms. (Patient not taking: Reported on 10/27/2022) 20 tablet 0   Doxylamine-Pyridoxine 10-10 MG TBEC Take 2 tablets at bedtime by mouth as needed for nausea for 2 days.  If symptoms continue to persist after 2 days, begin taking 1 tablet by mouth in the morning and 2 tablets at bedtime as needed. (Patient not taking: Reported on 10/27/2022) 30 tablet 0   fluticasone (FLONASE) 50 MCG/ACT nasal spray Place 2 sprays into both nostrils daily. (Patient not taking: Reported on 04/17/2021) 16 g 0   No current facility-administered medications on file prior to visit.    No Known Allergies  Social History:  reports that she has quit smoking. Her smoking use included cigarettes. She has never used smokeless tobacco. She reports that she does not currently use drugs. She reports that she does not drink alcohol.  Family History  Problem Relation Age of Onset   Asthma Mother    Diabetes Mother    Hypertension Mother    Miscarriages / [place] Mother    Stroke Mother    Asthma Father    Depression Father    Stroke Father    Diabetes Sister    Mental illness Sister    Miscarriages / Stillbirths Sister    Diabetes Brother  Asthma Daughter    Diabetes Maternal Aunt     The following portions of the patient's history were reviewed and updated as appropriate: allergies, current medications, past family history, past medical history, past social history, past surgical history and problem list.  Review of Systems Review of Systems  Constitutional:  Negative for chills and fever.  HENT:  Negative for congestion and sore throat.   Eyes:  Negative for pain and visual disturbance.  Respiratory:  Negative for cough, chest tightness and shortness of breath.   Cardiovascular:  Negative for chest pain.  Gastrointestinal:  Negative for abdominal pain, diarrhea, nausea and vomiting.   Endocrine: Negative for cold intolerance and heat intolerance.  Genitourinary:  Negative for dysuria and flank pain.  Musculoskeletal:  Negative for back pain.  Skin:  Negative for rash.  Allergic/Immunologic: Negative for food allergies.  Neurological:  Negative for dizziness and light-headedness.  Psychiatric/Behavioral:  Negative for agitation.       Physical Exam:  BP 122/86   Pulse 85   Wt 182 lb (82.6 kg)   LMP 09/04/2022 (Exact Date)   BMI 32.24 kg/m  CONSTITUTIONAL: Well-developed, well-nourished female in no acute distress.  HENT:  Normocephalic, atraumatic.  Oropharynx is clear and moist EYES: Conjunctivae normal. No scleral icterus.  NECK: Normal range of motion, supple, no masses.  Normal thyroid.  SKIN: Skin is warm and dry. No rash noted. Not diaphoretic. No erythema. No pallor. MUSCULOSKELETAL: Normal range of motion. No tenderness.  No cyanosis, clubbing, or edema.   NEUROLOGIC: Alert and oriented to person, place, and time. Normal muscle tone coordination.  PSYCHIATRIC: Normal mood and affect. Normal behavior. Normal judgment and thought content. CARDIOVASCULAR: Normal heart rate noted, regular rhythm RESPIRATORY: Clear to auscultation bilaterally. Effort and breath sounds normal, no problems with respiration noted. BREASTS: Symmetric in size. No masses, skin changes, nipple drainage, or lymphadenopathy. ABDOMEN: Soft, normal bowel sounds, no distention noted.  No tenderness, rebound or guarding. Fundal ht: 12 PELVIC: deferred-- had pap at [facility] and we will request records  Fetal Heart Rate (bpm): 156           Assessment:    Pregnancy: G2P0101  1. History of pre-eclampsia in prior pregnancy, currently pregnant Recommended starting ASA  2. History of HELLP syndrome, currently pregnant Recommended ASA therapy Reviewed benefit of 162 mg, patient agrees to treatment  3. Supervision of high risk pregnancy in first trimester Has some labs at [facility] and will fill  others today - Hepatitis C antibody - Hepatitis B Surface AntiGEN - Rubella screen - RPR - Antibody screen - PANORAMA PRENATAL TEST - Blood Pressure Monitor MISC; For regular home bp monitoring during pregnancy  Dispense: 1 each; Refill: 0 - Urine Culture - Hemoglobin A1c - [facility] AMB BABYSCRIPTS SCHEDULE OPTIMIZATION - HORIZON CUSTOM  4. Diabetes mellitus screening - Hemoglobin A1c  5. Body mass index (BMI) 33.0-33.9, adult - Hemoglobin A1c - Patient was worried about her appetite and family is telling her to eat for two. Discussed normal calorie needs in pregnancy -TWG=-8 lb 0.6 oz (-3.645 kg)   6. [redacted] weeks gestation of pregnancy - Hepatitis C antibody - Hepatitis B Surface AntiGEN - Rubella screen - RPR - Antibody screen - PANORAMA PRENATAL TEST - Blood Pressure Monitor MISC; For regular home bp monitoring during pregnancy  Dispense: 1 each; Refill: 0 - Urine Culture - Hemoglobin A1c - CHL AMB BABYSCRIPTS SCHEDULE OPTIMIZATION - HORIZON CUSTOM  7. Chronic hypertension during pregnancy, antepartum BP is WNL -  patient needed refills sent today - NIFEdipine (PROCARDIA XL/NIFEDICAL XL) 60 MG 24 hr tablet; Take 1 tablet (60 mg total) by mouth daily.  Dispense: 30 tablet; Refill: 6    Plan:     Initial labs drawn. Prenatal vitamins. Problem list reviewed and updated. Reviewed in detail the nature of the practice with collaborative care between  Genetic screening discussed: NIPS/ ordered. Role of ultrasound in pregnancy discussed; Anatomy US: ordered. Amniocentesis discussed: not indicated. Follow up in 4 weeks. Discussed clinic routines, schedule of care and testing, genetic screening options, involvement of students and residents under the direct supervision of APPs and doctors and presence of female providers. Pt verbalized understanding.  Return in about 4 weeks (around 12/29/2022) for Routine prenatal care, MD or APP.  No future appointments.  Federico Flake, MD 12/01/2022 9:40 AM

## 2022-12-02 LAB — HEPATITIS B SURFACE ANTIGEN: Hepatitis B Surface Ag: NEGATIVE

## 2022-12-02 LAB — RPR: RPR Ser Ql: NONREACTIVE

## 2022-12-02 LAB — RUBELLA SCREEN: Rubella Antibodies, IGG: 1.76 index (ref >0.99)

## 2022-12-02 LAB — HEPATITIS C ANTIBODY: Hep C Virus Ab: NONREACTIVE

## 2022-12-02 LAB — HEMOGLOBIN A1C
Est. average glucose Bld gHb Est-mCnc: 117 mg/dL
Hgb A1c MFr Bld: 5.7 % — ABNORMAL HIGH (ref 4.8–5.6)

## 2022-12-02 LAB — ANTIBODY SCREEN: Antibody Screen: NEGATIVE

## 2022-12-04 ENCOUNTER — Encounter: Payer: Self-pay | Admitting: Family Medicine

## 2022-12-04 DIAGNOSIS — R7303 Prediabetes: Secondary | ICD-10-CM | POA: Insufficient documentation

## 2022-12-05 ENCOUNTER — Encounter: Payer: Self-pay | Admitting: Women's Health

## 2022-12-05 ENCOUNTER — Encounter: Payer: Self-pay | Admitting: *Deleted

## 2022-12-05 DIAGNOSIS — B009 Herpesviral infection, unspecified: Secondary | ICD-10-CM | POA: Insufficient documentation

## 2022-12-05 LAB — URINE CULTURE

## 2022-12-08 ENCOUNTER — Telehealth: Payer: Self-pay | Admitting: Obstetrics & Gynecology

## 2022-12-08 ENCOUNTER — Other Ambulatory Visit: Payer: Medicaid Other

## 2022-12-08 DIAGNOSIS — Z131 Encounter for screening for diabetes mellitus: Secondary | ICD-10-CM

## 2022-12-08 NOTE — Telephone Encounter (Signed)
Pt states she needs NIFEdipine  filled today. She is completely out.

## 2022-12-09 LAB — PANORAMA PRENATAL TEST FULL PANEL:PANORAMA TEST PLUS 5 ADDITIONAL MICRODELETIONS: FETAL FRACTION: 7.4

## 2022-12-09 LAB — GLUCOSE TOLERANCE, 2 HOURS W/ 1HR
Glucose, 1 hour: 95 mg/dL (ref 70–179)
Glucose, 2 hour: 117 mg/dL (ref 70–152)
Glucose, Fasting: 89 mg/dL (ref 70–91)

## 2022-12-12 LAB — HORIZON CUSTOM: REPORT SUMMARY: POSITIVE — AB

## 2022-12-14 ENCOUNTER — Encounter: Payer: Self-pay | Admitting: Family Medicine

## 2022-12-14 ENCOUNTER — Encounter: Payer: Medicaid Other | Admitting: Advanced Practice Midwife

## 2022-12-18 ENCOUNTER — Telehealth: Payer: Self-pay

## 2022-12-18 ENCOUNTER — Encounter: Payer: Self-pay | Admitting: *Deleted

## 2022-12-18 NOTE — Telephone Encounter (Signed)
Returned patient's call.  States she needs a letter with lifting restrictions for her job.  Will send via mychart.

## 2022-12-18 NOTE — Telephone Encounter (Signed)
Patient would like for you to call her. Patient states that she needs a letter stating she can work.  She is trying to get a new job.

## 2023-01-01 ENCOUNTER — Other Ambulatory Visit (HOSPITAL_COMMUNITY): Admission: RE | Admit: 2023-01-01 | Payer: Medicaid Other | Source: Ambulatory Visit

## 2023-01-01 ENCOUNTER — Ambulatory Visit (INDEPENDENT_AMBULATORY_CARE_PROVIDER_SITE_OTHER): Payer: Medicaid Other | Admitting: Women's Health

## 2023-01-01 ENCOUNTER — Encounter: Payer: Self-pay | Admitting: Women's Health

## 2023-01-01 VITALS — BP 113/80 | HR 96 | Wt 184.0 lb

## 2023-01-01 DIAGNOSIS — Z124 Encounter for screening for malignant neoplasm of cervix: Secondary | ICD-10-CM | POA: Insufficient documentation

## 2023-01-01 DIAGNOSIS — O10919 Unspecified pre-existing hypertension complicating pregnancy, unspecified trimester: Secondary | ICD-10-CM

## 2023-01-01 DIAGNOSIS — O09299 Supervision of pregnancy with other poor reproductive or obstetric history, unspecified trimester: Secondary | ICD-10-CM

## 2023-01-01 DIAGNOSIS — O0992 Supervision of high risk pregnancy, unspecified, second trimester: Secondary | ICD-10-CM

## 2023-01-01 DIAGNOSIS — Z3482 Encounter for supervision of other normal pregnancy, second trimester: Secondary | ICD-10-CM

## 2023-01-01 DIAGNOSIS — B009 Herpesviral infection, unspecified: Secondary | ICD-10-CM

## 2023-01-01 DIAGNOSIS — Z1389 Encounter for screening for other disorder: Secondary | ICD-10-CM

## 2023-01-01 DIAGNOSIS — F418 Other specified anxiety disorders: Secondary | ICD-10-CM

## 2023-01-01 DIAGNOSIS — Z331 Pregnant state, incidental: Secondary | ICD-10-CM

## 2023-01-01 DIAGNOSIS — Z3A17 17 weeks gestation of pregnancy: Secondary | ICD-10-CM

## 2023-01-01 DIAGNOSIS — O34219 Maternal care for unspecified type scar from previous cesarean delivery: Secondary | ICD-10-CM

## 2023-01-01 DIAGNOSIS — Z148 Genetic carrier of other disease: Secondary | ICD-10-CM

## 2023-01-01 DIAGNOSIS — Z363 Encounter for antenatal screening for malformations: Secondary | ICD-10-CM

## 2023-01-01 LAB — POCT URINALYSIS DIPSTICK OB
Blood, UA: NEGATIVE
Glucose, UA: NEGATIVE
Ketones, UA: NEGATIVE
Leukocytes, UA: NEGATIVE
Nitrite, UA: NEGATIVE
POC,PROTEIN,UA: NEGATIVE

## 2023-01-01 MED ORDER — SERTRALINE HCL 25 MG PO TABS: 25.0000 mg | ORAL_TABLET | Freq: Every day | ORAL | 3 refills | Status: DC

## 2023-01-01 MED ORDER — VALACYCLOVIR HCL 500 MG PO TABS: 500.0000 mg | ORAL_TABLET | Freq: Two times a day (BID) | ORAL | 6 refills | Status: DC

## 2023-01-01 NOTE — Progress Notes (Signed)
HIGH-RISK PREGNANCY VISIT Patient name: Donna Conrad MRN [id number]  Date of birth: [birth year] Chief Complaint:   Routine Prenatal Visit (Pap today/ need refill Valtrex)  History of Present Illness:   Donna Conrad is a 27 y.o. G14P0101 female at [redacted]w[redacted]d with an Estimated Date of Delivery: 06/11/23 being seen today for ongoing management of a high-risk pregnancy complicated by chronic hypertension currently on nifedipine 60mg  daily.    Today she reports  pelvic pressure, some pressure w/ voiding. Denies other UTI sx . Dep/anx, currently denies SI/HI. Feels more anxiety than depression, woke up after dream and felt she was having a panic attack, never on meds. Wants to start meds, ok w/ IBH referral. FOB declines SMA testing. Contractions: Not present.  .  Movement: Absent. denies leaking of fluid.      12/01/2022    9:09 AM  Depression screen PHQ 2/9  Decreased Interest 2  Down, Depressed, Hopeless 2  PHQ - 2 Score 4  Altered sleeping 3  Tired, decreased energy 3  Change in appetite 3  Feeling bad or failure about yourself  3  Trouble concentrating 3  Moving slowly or fidgety/restless 2  Suicidal thoughts 1  PHQ-9 Score 22        12/01/2022    9:10 AM  GAD 7 : Generalized Anxiety Score  Nervous, Anxious, on Edge 3  Control/stop worrying 3  Worry too much - different things 3  Trouble relaxing 3  Restless 0  Easily annoyed or irritable 3  Afraid - awful might happen 3  Total GAD 7 Score 18     Review of Systems:   Pertinent items are noted in HPI Denies abnormal vaginal discharge w/ itching/odor/irritation, headaches, visual changes, shortness of breath, chest pain, abdominal pain, severe nausea/vomiting, or problems with urination or bowel movements unless otherwise stated above. Pertinent History Reviewed:  Reviewed past medical,surgical, social, obstetrical and family history.  Reviewed problem list, medications and allergies. Physical Assessment:   Vitals:    01/01/23 0948  BP: 113/80  Pulse: 96  Weight: 184 lb (83.5 kg)  Body mass index is 32.59 kg/m.           Physical Examination:   General appearance: alert, well appearing, and in no distress  Mental status: alert, oriented to person, place, and time  Skin: warm & dry   Extremities: Edema: None    Cardiovascular: normal heart rate noted  Respiratory: normal respiratory effort, no distress  Abdomen: gravid, soft, non-tender  Pelvic:  thin prep obtained, cx visually long and closed          Fetal Status: Fetal Heart Rate (bpm): 151   Movement: Absent    Fetal Surveillance Testing today: doppler   Chaperone: Peggy Dones    No results found for this or any previous visit (from the past 24 hour(s)).  Assessment & Plan:  High-risk pregnancy: G2P0101 at [redacted]w[redacted]d with an Estimated Date of Delivery: 06/11/23   1) CHTN, stable on nifedipine 60mg  daily, ASA  2) Screening for cervical cancer, pap today  3) Pelvic/bladder pressure> cx visually long/closed, urine dipstick neg, will send culture  4) Dep/anx> rx zoloft 25mg , understands can take a few weeks to notice improvement, IBH referral ordered  5) HSV> wants daily suppression, already taking valtrex prn, rx valtrex 500mg  BID  6) +SMA carrier> FOB declines testing, discussed if he is carrier, baby has 1:4, 25% chance of having SMA and if does have SMA can potentially give med during  pregnancy to lessen severity, so recommended speaking w/ FOB again  7) Prev 30wk C/S for worsening HELLP> continue ASA, wants RCS  Meds:  Meds ordered this encounter  Medications   sertraline (ZOLOFT) 25 MG tablet    Sig: Take 1 tablet (25 mg total) by mouth daily.    Dispense:  90 tablet    Refill:  3   valACYclovir (VALTREX) 500 MG tablet    Sig: Take 1 tablet (500 mg total) by mouth 2 (two) times daily.    Dispense:  60 tablet    Refill:  6    Labs/procedures today: pap and urine culture  Treatment Plan:  [place] 24, 28, 32, 36wks    2x/wk testing  nst/sono @ 32wks     Deliver 37-39wks (36-37wks or prn if poor control)____   Reviewed: Preterm labor symptoms and general obstetric precautions including but not limited to vaginal bleeding, contractions, leaking of fluid and fetal movement were reviewed in detail with the patient.  All questions were answered. Does have home bp cuff. Office bp cuff given: not applicable. Check bp weekly, let us know if consistently >140 and/or >90.  Follow-up: Return for As scheduled.   Future Appointments  Date Time Provider Department Center  01/15/2023  9:15 AM [facility] [place] [facility] None  01/15/2023 10:10 AM Cheral Marker, CNM [facility]    Orders Placed This Encounter  Procedures   US OB Comp + 14 Wk   Amb ref to Integrated Behavioral Health   POC Urinalysis Dipstick OB   ABO/Rh   Cheral Marker CNM, [facility] 01/01/2023 10:37 AM

## 2023-01-01 NOTE — BH Specialist Note (Unsigned)
Integrated Behavioral Health via Telemedicine Visit  01/02/2023 [name] [id number]  Number of Integrated Behavioral Health Clinician visits: 1- Initial Visit  Session Start time: 0914   Session End time: 4259  Total time in minutes: 29   Referring Provider: Shawna Clamp, CNM Patient/Family location: Home [facility] Provider location: [facility]  All persons participating in visit: [name] and [facility] Norberto   Types of Service: Individual psychotherapy and Video visit  I connected with [name] and/or Merry Proud C Rudman's  n/a  via  Telephone or Engineer, civil (consulting)  (Video is Surveyor, mining) and verified that I am speaking with the correct person using two identifiers. Discussed confidentiality: Yes   I discussed the limitations of telemedicine and the availability of in person appointments.  Discussed there is a possibility of technology failure and discussed alternative modes of communication if that failure occurs.  I discussed that engaging in this telemedicine visit, they consent to the provision of behavioral healthcare and the services will be billed under their insurance.  Patient and/or legal guardian expressed understanding and consented to Telemedicine visit: Yes   Presenting Concerns: Patient and/or family reports the following symptoms/concerns: Increased anxiety with panic attacks and depression, attributes to previous job loss and waking up in panic with nightmares. Pt has felt some decrease in overwhelming emotion after current two jobs (one with promotion; both with pay raises); prayer and Bible reading to cope upon waking. Pt plans to begin taking Zoloft tonight. No current SI, no intent and no plan.  Duration of problem: Current pregnancy; Severity of problem: severe  Patient and/or Family's Strengths/Protective Factors: Social connections, Concrete  supports in place (healthy food, safe environments, etc.), Sense of purpose, and Physical Health (exercise, healthy diet, medication compliance, etc.)  Goals Addressed: Patient will:  Reduce symptoms of: anxiety, depression, insomnia, and stress   Increase knowledge and/or ability of: healthy habits and stress reduction   Demonstrate ability to: Increase healthy adjustment to current life circumstances and Increase motivation to adhere to plan of care  Progress towards Goals: Ongoing  Interventions: Interventions utilized:  Functional Assessment of ADLs, Psychoeducation and/or Health Education, Link to [employer], and Supportive Reflection Standardized Assessments completed: Not Needed  Patient and/or Family Response: Patient agrees with treatment plan.   Assessment: Patient currently experiencing Mood disorder, unspecified.   Patient may benefit from psychoeducation and brief therapeutic interventions regarding coping with symptoms of anxiety with panic, depression, life stress .  Plan: Follow up with behavioral health clinician on : Two weeks Behavioral recommendations:  -Begin taking Zoloft as prescribed; Call Asher Muir at [phone] as needed -Continue keeping it cool, dark and quiet at bedtime; using prayer and Bible-reading if waking in a panic again -Continue plan to apply for [facility] as soon as able -Continue prioritizing healthy self-care (regular meals, adequate rest; allowing practical help from supportive friends and family, as needed daily) -Read through information on After Visit Summary; use as needed Referral(s): Integrated [employer] (In Clinic)   I discussed the assessment and treatment plan with the patient and/or parent/guardian. They were provided an opportunity to ask questions and all were answered. They agreed with the plan and demonstrated an understanding of the instructions.   They were advised to call back or seek an in-person  evaluation if the symptoms worsen or if the condition fails to improve as anticipated.  Rae Lips, LCSW     12/01/2022  9:09 AM  Depression screen PHQ 2/9  Decreased Interest 2  Down, Depressed, Hopeless 2  PHQ - 2 Score 4  Altered sleeping 3  Tired, decreased energy 3  Change in appetite 3  Feeling bad or failure about yourself  3  Trouble concentrating 3  Moving slowly or fidgety/restless 2  Suicidal thoughts 1  PHQ-9 Score 22      12/01/2022    9:10 AM  GAD 7 : Generalized Anxiety Score  Nervous, Anxious, on Edge 3  Control/stop worrying 3  Worry too much - different things 3  Trouble relaxing 3  Restless 0  Easily annoyed or irritable 3  Afraid - awful might happen 3  Total GAD 7 Score 18

## 2023-01-01 NOTE — Patient Instructions (Signed)
Donna Conrad, thank you for choosing our office today! We appreciate the opportunity to meet your healthcare needs. You may receive a short survey by mail, e-mail, or through [employer]. If you are happy with your care we would appreciate if you could take just a few minutes to complete the survey questions. We read all of your comments and take your feedback very seriously. Thank you again for choosing our office.  Center for [employer] Team at [facility] at [facility] ([address] [place], [place] [address]) Entrance C, located off of [address] Free 24/7 valet parking  Go to [email or web address] to register for FREE online childbirth classes  Call the office [phone]) or go to [facility] if: You begin to severe cramping Your water breaks.  Sometimes it is a big gush of fluid, sometimes it is just a trickle that keeps getting your panties wet or running down your legs You have vaginal bleeding.  It is normal to have a small amount of spotting if your cervix was checked.   [facility] Pediatricians/Family Doctors [facility]): [address] Colette Ribas, [phone]           [facility]: [address], [phone]                [facility]): [address], [phone] (call to ask if accepting patients) [facility] Department: [address], [place], [phone]    [facility] Pediatricians/Family Doctors [facility]): [phone] [address], [phone] [facility]: [address] [place], [phone] [facility]: [address], [phone]  [facility] Doctors  [facility]): [phone] [facility]: [address], [phone]   [facility] Doctors [facility]: [address], [phone]  [facility] Doctors  [facility]: [phone], [phone]  Home Blood Pressure Monitoring for Patients   Your provider has recommended that you check your blood pressure (BP) at least once a week at home. If you do not have a blood pressure cuff at home, one will be provided for you. Contact your provider if you have not received your monitor within 1 week.   Helpful Tips for Accurate Home Blood Pressure Checks  Don't smoke, exercise, or drink caffeine 30 minutes before checking your BP Use the restroom before checking your BP (a full bladder can raise your pressure) Relax in a comfortable upright chair Feet on the ground Left arm resting comfortably on a flat surface at the level of your heart Legs uncrossed Back supported Sit quietly and don't talk Place the cuff on your bare arm Adjust snuggly, so that only two fingertips can fit between your skin and the top of the cuff Check 2 readings separated by at least one minute Keep a log of your BP readings For a visual, please reference this diagram: http://ccnc.care/bpdiagram  Provider Name: [facility]     Phone: [phone]  Zone 1: ALL CLEAR  Continue to monitor your symptoms:  BP reading is less than 140 (top number) or less than 90 (bottom number)  No right upper stomach pain No headaches or seeing spots No feeling nauseated or throwing up No swelling in face and hands  Zone 2: CAUTION Call your doctor's office for any of the following:  BP reading is greater than 140 (top number) or greater than  90 (bottom number)  Stomach pain under your ribs in the middle or right side Headaches or seeing spots Feeling nauseated or throwing up Swelling in face and hands  Zone 3: EMERGENCY  Seek immediate medical care if you have any of the following:  BP reading is greater than160 (top number) or greater than 110 (bottom number) Severe headaches not improving with Tylenol Serious difficulty catching your breath Any worsening symptoms from  Zone 2     Second Trimester of Pregnancy The second trimester is from week 14 through week 27 (months 4 through 6). The second trimester is often a time when you feel your best. Your body has adjusted to being pregnant, and you begin to feel better physically. Usually, morning sickness has lessened or quit completely, you may have more energy, and you may have an increase in appetite. The second trimester is also a time when the fetus is growing rapidly. At the end of the sixth month, the fetus is about 9 inches long and weighs about 1 pounds. You will likely begin to feel the baby move (quickening) between 16 and 20 weeks of pregnancy. Body changes during your second trimester Your body continues to go through many changes during your second trimester. The changes vary from woman to woman. Your weight will continue to increase. You will notice your lower abdomen bulging out. You may begin to get stretch marks on your hips, abdomen, and breasts. You may develop headaches that can be relieved by medicines. The medicines should be approved by your health care provider. You may urinate more often because the fetus is pressing on your bladder. You may develop or continue to have heartburn as a result of your pregnancy. You may develop constipation because certain hormones are causing the muscles that push waste through your intestines to slow down. You may develop hemorrhoids or swollen, bulging veins (varicose veins). You may have back pain. This is caused by: Weight gain. Pregnancy hormones that are relaxing the joints in your pelvis. A shift in weight and the muscles that support your balance. Your breasts will continue to grow and they will continue to become tender. Your gums may bleed and may be sensitive to brushing and flossing. Dark spots or blotches (chloasma, mask of pregnancy) may develop on your face. This will likely fade after the baby is born. A dark line from your belly button to  the pubic area (linea nigra) may appear. This will likely fade after the baby is born. You may have changes in your hair. These can include thickening of your hair, rapid growth, and changes in texture. Some women also have hair loss during or after pregnancy, or hair that feels dry or thin. Your hair will most likely return to normal after your baby is born.  What to expect at prenatal visits During a routine prenatal visit: You will be weighed to make sure you and the fetus are growing normally. Your blood pressure will be taken. Your abdomen will be measured to track your baby's growth. The fetal heartbeat will be listened to. Any test results from the previous visit will be discussed.  Your health care provider may ask you: How you are feeling. If you are feeling the baby move. If you have had any abnormal symptoms, such as leaking fluid, bleeding, severe headaches, or abdominal cramping. If you are using any tobacco products, including cigarettes, chewing tobacco, and electronic cigarettes. If you have any questions.  Other tests that may be performed during  your second trimester include: Blood tests that check for: Low iron levels (anemia). High blood sugar that affects pregnant women (gestational diabetes) between 41 and 28 weeks. Rh antibodies. This is to check for a protein on red blood cells (Rh factor). Urine tests to check for infections, diabetes, or protein in the urine. An ultrasound to confirm the proper growth and development of the baby. An amniocentesis to check for possible genetic problems. Fetal screens for spina bifida and Down syndrome. HIV (human immunodeficiency virus) testing. Routine prenatal testing includes screening for HIV, unless you choose not to have this test.  Follow these instructions at home: Medicines Follow your health care provider's instructions regarding medicine use. Specific medicines may be either safe or unsafe to take during  pregnancy. Take a prenatal vitamin that contains at least 600 micrograms (mcg) of folic acid. If you develop constipation, try taking a stool softener if your health care provider approves. Eating and drinking Eat a balanced diet that includes fresh fruits and vegetables, whole grains, good sources of protein such as meat, eggs, or tofu, and low-fat dairy. Your health care provider will help you determine the amount of weight gain that is right for you. Avoid raw meat and uncooked cheese. These carry germs that can cause birth defects in the baby. If you have low calcium intake from food, talk to your health care provider about whether you should take a daily calcium supplement. Limit foods that are high in fat and processed sugars, such as fried and sweet foods. To prevent constipation: Drink enough fluid to keep your urine clear or pale yellow. Eat foods that are high in fiber, such as fresh fruits and vegetables, whole grains, and beans. Activity Exercise only as directed by your health care provider. Most women can continue their usual exercise routine during pregnancy. Try to exercise for 30 minutes at least 5 days a week. Stop exercising if you experience uterine contractions. Avoid heavy lifting, wear low heel shoes, and practice good posture. A sexual relationship may be continued unless your health care provider directs you otherwise. Relieving pain and discomfort Wear a good support bra to prevent discomfort from breast tenderness. Take warm sitz baths to soothe any pain or discomfort caused by hemorrhoids. Use hemorrhoid cream if your health care provider approves. Rest with your legs elevated if you have leg cramps or low back pain. If you develop varicose veins, wear support hose. Elevate your feet for 15 minutes, 3-4 times a day. Limit salt in your diet. Prenatal Care Write down your questions. Take them to your prenatal visits. Keep all your prenatal visits as told by your health  care provider. This is important. Safety Wear your seat belt at all times when driving. Make a list of emergency phone numbers, including numbers for family, friends, the hospital, and police and fire departments. General instructions Ask your health care provider for a referral to a local prenatal education class. Begin classes no later than the beginning of month 6 of your pregnancy. Ask for help if you have counseling or nutritional needs during pregnancy. Your health care provider can offer advice or refer you to specialists for help with various needs. Do not use hot tubs, steam rooms, or saunas. Do not douche or use tampons or scented sanitary pads. Do not cross your legs for long periods of time. Avoid cat litter boxes and soil used by cats. These carry germs that can cause birth defects in the baby and possibly loss of the  fetus by miscarriage or stillbirth. Avoid all smoking, herbs, alcohol, and unprescribed drugs. Chemicals in these products can affect the formation and growth of the baby. Do not use any products that contain nicotine or tobacco, such as cigarettes and e-cigarettes. If you need help quitting, ask your health care provider. Visit your dentist if you have not gone yet during your pregnancy. Use a soft toothbrush to brush your teeth and be gentle when you floss. Contact a health care provider if: You have dizziness. You have mild pelvic cramps, pelvic pressure, or nagging pain in the abdominal area. You have persistent nausea, vomiting, or diarrhea. You have a bad smelling vaginal discharge. You have pain when you urinate. Get help right away if: You have a fever. You are leaking fluid from your vagina. You have spotting or bleeding from your vagina. You have severe abdominal cramping or pain. You have rapid weight gain or weight loss. You have shortness of breath with chest pain. You notice sudden or extreme swelling of your face, hands, ankles, feet, or legs. You  have not felt your baby move in over an hour. You have severe headaches that do not go away when you take medicine. You have vision changes. Summary The second trimester is from week 14 through week 27 (months 4 through 6). It is also a time when the fetus is growing rapidly. Your body goes through many changes during pregnancy. The changes vary from woman to woman. Avoid all smoking, herbs, alcohol, and unprescribed drugs. These chemicals affect the formation and growth your baby. Do not use any tobacco products, such as cigarettes, chewing tobacco, and e-cigarettes. If you need help quitting, ask your health care provider. Contact your health care provider if you have any questions. Keep all prenatal visits as told by your health care provider. This is important. This information is not intended to replace advice given to you by your health care provider. Make sure you discuss any questions you have with your health care provider. Document Released: 05/16/2001 Document Revised: 10/28/2015 Document Reviewed: 07/23/2012 Elsevier Interactive Patient Education  2017 [employer].

## 2023-01-02 ENCOUNTER — Encounter: Payer: Self-pay | Admitting: Clinical

## 2023-01-02 ENCOUNTER — Ambulatory Visit (INDEPENDENT_AMBULATORY_CARE_PROVIDER_SITE_OTHER): Payer: Medicaid Other | Admitting: Clinical

## 2023-01-02 DIAGNOSIS — F39 Unspecified mood [affective] disorder: Secondary | ICD-10-CM

## 2023-01-02 NOTE — Patient Instructions (Addendum)
Center for [facility] at [facility] for Women [address], [place] [address] [phone] (main office) [phone] [facility] office)  [email or web address]  [address] [place], [place] [address] [phone] or [phone] [facility] 24/7 FOR ANYONE [address] [place], [place] Fax: [phone] [email or web address] *Interpreters available *Accepts all insurance and uninsured for Urgent Care needs *Accepts Medicaid and uninsured for outpatient treatment (below)    ONLY FOR [facility]  Below:   Outpatient New Patient Assessment/Therapy Walk-ins:        Monday -Thursday 8am until slots are full.        Every Friday 1pm-4pm  (first come, first served)                   New Patient Psychiatry/Medication Management        Monday-Friday 8am-11am (first come, first served)              For all walk-ins we ask that you arrive by 7:15am, because patients will be seen in the order of arrival.

## 2023-01-03 NOTE — BH Specialist Note (Unsigned)
Integrated Behavioral Health via Telemedicine Visit  01/03/2023 Donna Conrad [id number]  Number of Integrated Behavioral Health Clinician visits: 1- Initial Visit  Session Start time: 0914   Session End time: 0943  Total time in minutes: 29   Referring Provider: *** Patient/Family location: [facility] Provider location: *** All persons participating in visit: *** Types of Service: {CHL AMB TYPE OF [phone]}  I connected with Donna Conrad and/or Donna Conrad's {family members:20773} via  Telephone or Engineer, civil (consulting)  (Video is Surveyor, mining) and verified that I am speaking with the correct person using two identifiers. Discussed confidentiality: {YES/NO:21197}  I discussed the limitations of telemedicine and the availability of in person appointments.  Discussed there is a possibility of technology failure and discussed alternative modes of communication if that failure occurs.  I discussed that engaging in this telemedicine visit, they consent to the provision of behavioral healthcare and the services will be billed under their insurance.  Patient and/or legal guardian expressed understanding and consented to Telemedicine visit: {YES/NO:21197}  Presenting Concerns: Patient and/or family reports the following symptoms/concerns: *** Duration of problem: ***; Severity of problem: {Mild/Moderate/Severe:20260}  Patient and/or Family's Strengths/Protective Factors: {CHL AMB BH PROTECTIVE FACTORS:[phone]}  Goals Addressed: Patient will:  Reduce symptoms of: {IBH Symptoms:21014056}   Increase knowledge and/or ability of: {IBH Patient Tools:21014057}   Demonstrate ability to: {IBH Goals:21014053}  Progress towards Goals: {CHL AMB BH PROGRESS TOWARDS GOALS:[phone]}  Interventions: Interventions utilized:  {IBH Interventions:21014054} Standardized Assessments completed: {IBH Screening Tools:21014051}  Patient and/or Family  Response: ***  Assessment: Patient currently experiencing ***.   Patient may benefit from ***.  Plan: Follow up with behavioral health clinician on : *** Behavioral recommendations: *** Referral(s): {IBH Referrals:21014055}  I discussed the assessment and treatment plan with the patient and/or parent/guardian. They were provided an opportunity to ask questions and all were answered. They agreed with the plan and demonstrated an understanding of the instructions.   They were advised to call back or seek an in-person evaluation if the symptoms worsen or if the condition fails to improve as anticipated.  Valetta Close [name], LCSW

## 2023-01-04 ENCOUNTER — Other Ambulatory Visit: Payer: Self-pay

## 2023-01-04 ENCOUNTER — Inpatient Hospital Stay (HOSPITAL_COMMUNITY)
Admission: AD | Admit: 2023-01-04 | Discharge: 2023-01-04 | Disposition: A | Discharge status: Home / Self Care | Payer: Medicaid Other | Attending: Obstetrics & Gynecology | Admitting: Obstetrics & Gynecology

## 2023-01-04 ENCOUNTER — Encounter (HOSPITAL_COMMUNITY): Payer: Self-pay | Admitting: Obstetrics & Gynecology

## 2023-01-04 DIAGNOSIS — Z20822 Contact with and (suspected) exposure to covid-19: Secondary | ICD-10-CM | POA: Insufficient documentation

## 2023-01-04 DIAGNOSIS — O26892 Other specified pregnancy related conditions, second trimester: Secondary | ICD-10-CM | POA: Diagnosis not present

## 2023-01-04 DIAGNOSIS — F419 Anxiety disorder, unspecified: Secondary | ICD-10-CM | POA: Diagnosis not present

## 2023-01-04 DIAGNOSIS — O10912 Unspecified pre-existing hypertension complicating pregnancy, second trimester: Secondary | ICD-10-CM | POA: Diagnosis not present

## 2023-01-04 DIAGNOSIS — Z3A17 17 weeks gestation of pregnancy: Secondary | ICD-10-CM | POA: Diagnosis not present

## 2023-01-04 DIAGNOSIS — R519 Headache, unspecified: Secondary | ICD-10-CM | POA: Diagnosis present

## 2023-01-04 DIAGNOSIS — Z711 Person with feared health complaint in whom no diagnosis is made: Secondary | ICD-10-CM

## 2023-01-04 DIAGNOSIS — O10919 Unspecified pre-existing hypertension complicating pregnancy, unspecified trimester: Secondary | ICD-10-CM

## 2023-01-04 DIAGNOSIS — O99342 Other mental disorders complicating pregnancy, second trimester: Secondary | ICD-10-CM | POA: Diagnosis not present

## 2023-01-04 DIAGNOSIS — O34219 Maternal care for unspecified type scar from previous cesarean delivery: Secondary | ICD-10-CM

## 2023-01-04 DIAGNOSIS — O0992 Supervision of high risk pregnancy, unspecified, second trimester: Secondary | ICD-10-CM

## 2023-01-04 LAB — SARS CORONAVIRUS 2 BY RT PCR: SARS Coronavirus 2 by RT PCR: NEGATIVE

## 2023-01-04 LAB — CBC WITH DIFFERENTIAL/PLATELET
Abs Immature Granulocytes: 0.06 10*3/uL (ref 0.00–0.07)
Basophils Absolute: 0 10*3/uL (ref 0.0–0.1)
Basophils Relative: 0 %
Eosinophils Absolute: 0 10*3/uL (ref 0.0–0.5)
Eosinophils Relative: 0 %
HCT: 30.7 % — ABNORMAL LOW (ref 36.0–46.0)
Hemoglobin: 10.4 g/dL — ABNORMAL LOW (ref 12.0–15.0)
Immature Granulocytes: 1 %
Lymphocytes Relative: 20 %
Lymphs Abs: 1.6 10*3/uL (ref 0.7–4.0)
MCH: 30.1 pg (ref 26.0–34.0)
MCHC: 33.9 g/dL (ref 30.0–36.0)
MCV: 88.7 fL (ref 80.0–100.0)
Monocytes Absolute: 0.7 10*3/uL (ref 0.1–1.0)
Monocytes Relative: 9 %
Neutro Abs: 5.3 10*3/uL (ref 1.7–7.7)
Neutrophils Relative %: 70 %
Platelets: 296 10*3/uL (ref 150–400)
RBC: 3.46 MIL/uL — ABNORMAL LOW (ref 3.87–5.11)
RDW: 14.4 % (ref 11.5–15.5)
WBC: 7.7 10*3/uL (ref 4.0–10.5)
nRBC: 0 % (ref 0.0–0.2)

## 2023-01-04 LAB — COMPREHENSIVE METABOLIC PANEL
ALT: 18 U/L (ref 0–44)
AST: 19 U/L (ref 15–41)
Albumin: 3.1 g/dL — ABNORMAL LOW (ref 3.5–5.0)
Alkaline Phosphatase: 52 U/L (ref 38–126)
Anion gap: 14 (ref 5–15)
BUN: 5 mg/dL — ABNORMAL LOW (ref 6–20)
CO2: 20 mmol/L — ABNORMAL LOW (ref 22–32)
Calcium: 9 mg/dL (ref 8.9–10.3)
Chloride: 100 mmol/L (ref 98–111)
Creatinine, Ser: 0.5 mg/dL (ref 0.44–1.00)
GFR, Estimated: >60 mL/min (ref >60)
Glucose, Bld: 83 mg/dL (ref 70–99)
Potassium: 3.6 mmol/L (ref 3.5–5.1)
Sodium: 134 mmol/L — ABNORMAL LOW (ref 135–145)
Total Bilirubin: 0.2 mg/dL — ABNORMAL LOW (ref 0.3–1.2)
Total Protein: 6.9 g/dL (ref 6.5–8.1)

## 2023-01-04 LAB — URINALYSIS, ROUTINE W REFLEX MICROSCOPIC
Bilirubin Urine: NEGATIVE
Glucose, UA: NEGATIVE mg/dL
Hgb urine dipstick: NEGATIVE
Ketones, ur: NEGATIVE mg/dL
Leukocytes,Ua: NEGATIVE
Nitrite: NEGATIVE
Protein, ur: NEGATIVE mg/dL
Specific Gravity, Urine: 1.023 (ref 1.005–1.030)
pH: 6 (ref 5.0–8.0)

## 2023-01-04 LAB — PROTEIN / CREATININE RATIO, URINE
Creatinine, Urine: 199 mg/dL
Protein Creatinine Ratio: 0.07 mg/mg{Cre} (ref 0.00–0.15)
Total Protein, Urine: 14 mg/dL

## 2023-01-04 MED ORDER — SUMATRIPTAN SUCCINATE 25 MG PO TABS
25.0000 mg | ORAL_TABLET | Freq: Once | ORAL | Status: AC
  Administered 2023-01-04: 25 mg via ORAL
  Filled 2023-01-04: qty 1

## 2023-01-04 MED ORDER — CYCLOBENZAPRINE HCL 5 MG PO TABS: 10.0000 mg | ORAL_TABLET | Freq: Once | ORAL | Status: DC

## 2023-01-04 NOTE — MAU Provider Note (Signed)
History     CSN: [id number]  Arrival date and time: 01/04/23 4098   Event Date/Time   First Provider Initiated Contact with Patient 01/04/23 1043      Chief Complaint  Patient presents with   Headache   Donna Conrad , a  27 y.o. G2P0101 at [redacted]w[redacted]d presents to MAU with complaints of a headache since last night. Patient states that last night after smoking weed she began to have a headache. She states that she tried sleeping it off, but woke up this morning with it still present. She states that she took 1000mg  of Tylenol rgis morning at 8am without relief. Describes the pain as constant and throbbing. Currently rates a 7/10 but states upon arrival it was a 9/10. She also reports that last night she began having chest tightness after smoking, and she was concerned that she may have Covid. She denies chest discomfort at this time and also denies SOB, cough, fever chills and other cold and flu like symptoms. She denies vaginal bleeding, leaking of fluid and abnormal vaginal discharge. She endorses some lower pelvic discomfort but states that is her normal. She endorses positive fetal movement. Patient is very anxious in triage, and is really worried that smoking caused harm to her and the baby.  She denies smoking Marijuana regularly. She states that she had a loss of appetite and could not sleep and tried to help those symptoms.   Hx of PreE in previous pregnancy at [redacted] weeks gestation.          OB History     Gravida  2   Para  1   Term      Preterm  1   AB      Living  1      SAB      IAB      Ectopic      Multiple  0   Live Births  1           Past Medical History:  Diagnosis Date   HSV infection    Pre-eclampsia     Past Surgical History:  Procedure Laterality Date   CESAREAN SECTION N/A 11/19/2014   Procedure: CESAREAN SECTION;  Surgeon: Sherian Rein, MD;  Location: [facility];  Service: Obstetrics;  Laterality: N/A;   WISDOM TOOTH EXTRACTION       Family History  Problem Relation Age of Onset   Asthma Mother    Diabetes Mother    Hypertension Mother    Miscarriages / [place] Mother    Stroke Mother    Bipolar disorder Mother    Asthma Father    Depression Father    Stroke Father    Diabetes Sister    Mental illness Sister    Miscarriages / Stillbirths Sister    Bipolar disorder Sister    Diabetes Brother    Asthma Daughter    Diabetes Maternal Aunt     Social History   Tobacco Use   Smoking status: Former    Types: Cigarettes   Smokeless tobacco: Never   Tobacco comments:    Pt reports smoking only some days, but has stopped since finding out she was pregnant.   Vaping Use   Vaping status: Never Used  Substance Use Topics   Alcohol use: Not Currently   Drug use: Not Currently    Types: Marijuana    Comment: last smoked 01/03/2023    Allergies: No Known Allergies  Medications Prior to Admission  Medication  Sig Dispense Refill Last Dose   aspirin EC 81 MG tablet Take 2 tablets (162 mg total) by mouth daily. Take after 12 weeks for prevention of preeclampsia later in pregnancy 300 tablet 2 01/03/2023   NIFEdipine (PROCARDIA XL/NIFEDICAL XL) 60 MG 24 hr tablet Take 1 tablet (60 mg total) by mouth daily. 30 tablet 6 01/03/2023   Prenatal Vit-Fe Fumarate-FA (PRENATAL VITAMIN PO) Take by mouth.   01/03/2023   Blood Pressure Monitor MISC For regular home bp monitoring during pregnancy 1 each 0    cyclobenzaprine (FLEXERIL) 10 MG tablet Take 1 tablet (10 mg total) by mouth 2 (two) times daily as needed for muscle spasms. (Patient not taking: Reported on 10/27/2022) 20 tablet 0    Doxylamine-Pyridoxine 10-10 MG TBEC Take 2 tablets at bedtime by mouth as needed for nausea for 2 days.  If symptoms continue to persist after 2 days, begin taking 1 tablet by mouth in the morning and 2 tablets at bedtime as needed. (Patient not taking: Reported on 10/27/2022) 30 tablet 0    fluticasone (FLONASE) 50 MCG/ACT nasal spray Place  2 sprays into both nostrils daily. (Patient not taking: Reported on 04/17/2021) 16 g 0    sertraline (ZOLOFT) 25 MG tablet Take 1 tablet (25 mg total) by mouth daily. 90 tablet 3    valACYclovir (VALTREX) 500 MG tablet Take 1 tablet (500 mg total) by mouth 2 (two) times daily. 60 tablet 6 01/02/2023    Review of Systems  Constitutional:  Negative for chills, fatigue and fever.  Eyes:  Negative for pain and visual disturbance.  Respiratory:  Positive for chest tightness. Negative for apnea, shortness of breath and wheezing.   Cardiovascular:  Negative for chest pain and palpitations.  Gastrointestinal:  Negative for abdominal pain, constipation, diarrhea, nausea and vomiting.  Genitourinary:  Positive for pelvic pain. Negative for difficulty urinating, dysuria, vaginal bleeding, vaginal discharge and vaginal pain.  Musculoskeletal:  Negative for back pain.  Neurological:  Positive for headaches. Negative for seizures and weakness.  Psychiatric/Behavioral:  Negative for suicidal ideas. The patient is nervous/anxious.    Physical Exam   Blood pressure 113/70, pulse 93, temperature 97.9 F (36.6 C), temperature source Oral, resp. rate 18, height 5\' 3"  (1.6 m), weight 85.1 kg, last menstrual period 09/04/2022, SpO2 100%.  Physical Exam Vitals and nursing note reviewed.  Constitutional:      General: She is not in acute distress.    Appearance: Normal appearance.  HENT:     Head: Normocephalic.  Cardiovascular:     Rate and Rhythm: Normal rate and regular rhythm.     Heart sounds: Normal heart sounds. No murmur heard.    No gallop.  Pulmonary:     Effort: Pulmonary effort is normal.     Breath sounds: Normal breath sounds.  Abdominal:     Palpations: Abdomen is soft.  Musculoskeletal:        General: No swelling.     Cervical back: Normal range of motion.  Skin:    General: Skin is warm and dry.  Neurological:     Mental Status: She is alert and oriented to person, place, and  time.     GCS: GCS eye subscore is 4. GCS verbal subscore is 5. GCS motor subscore is 6.     Deep Tendon Reflexes: Reflexes normal.  Psychiatric:        Mood and Affect: Mood is anxious.    FHT obtained in triage.   MAU Course  Procedures Orders Placed This Encounter  Procedures   SARS Coronavirus 2 by RT PCR (hospital order, performed in [facility] hospital lab) *cepheid single result test* Anterior Nasal Swab   Urinalysis, Routine w reflex microscopic -Urine, Clean Catch   CBC with Differential/Platelet   Comprehensive metabolic panel   Protein / creatinine ratio, urine   Droplet precaution   Results for orders placed or performed during the hospital encounter of 01/04/23 (from the past 24 hour(s))  Urinalysis, Routine w reflex microscopic -Urine, Clean Catch     Status: Abnormal   Collection Time: 01/04/23 10:30 AM  Result Value Ref Range   Color, Urine YELLOW YELLOW   APPearance HAZY (A) CLEAR   Specific Gravity, Urine 1.023 1.005 - 1.030   pH 6.0 5.0 - 8.0   Glucose, UA NEGATIVE NEGATIVE mg/dL   Hgb urine dipstick NEGATIVE NEGATIVE   Bilirubin Urine NEGATIVE NEGATIVE   Ketones, ur NEGATIVE NEGATIVE mg/dL   Protein, ur NEGATIVE NEGATIVE mg/dL   Nitrite NEGATIVE NEGATIVE   Leukocytes,Ua NEGATIVE NEGATIVE   Patient informed that the ultrasound is considered a limited OB ultrasound and is not intended to be a complete ultrasound exam.  Patient also informed that the ultrasound is not being completed with the intent of assessing for fetal or placental anomalies or any pelvic abnormalities.  Explained that the purpose of today's ultrasound is to assess for viability.  Patient acknowledges the purpose of the exam and the limitations of the study.  Claudette Head, CNM  01/04/2023 11:49 AM   Patient Vitals for the past 24 hrs:  BP Temp Temp src Pulse Resp SpO2 Height Weight  01/04/23 1015 113/70 97.9 F (36.6 C) Oral 93 18 100 % -- --  01/04/23 1006 -- -- -- -- -- -- 5\' 3"  (1.6  m) 85.1 kg   Results for orders placed or performed during the hospital encounter of 01/04/23 (from the past 24 hour(s))  Urinalysis, Routine w reflex microscopic -Urine, Clean Catch     Status: Abnormal   Collection Time: 01/04/23 10:30 AM  Result Value Ref Range   Color, Urine YELLOW YELLOW   APPearance HAZY (A) CLEAR   Specific Gravity, Urine 1.023 1.005 - 1.030   pH 6.0 5.0 - 8.0   Glucose, UA NEGATIVE NEGATIVE mg/dL   Hgb urine dipstick NEGATIVE NEGATIVE   Bilirubin Urine NEGATIVE NEGATIVE   Ketones, ur NEGATIVE NEGATIVE mg/dL   Protein, ur NEGATIVE NEGATIVE mg/dL   Nitrite NEGATIVE NEGATIVE   Leukocytes,Ua NEGATIVE NEGATIVE  Protein / creatinine ratio, urine     Status: None   Collection Time: 01/04/23 10:43 AM  Result Value Ref Range   Creatinine, Urine 199 mg/dL   Total Protein, Urine 14 mg/dL   Protein Creatinine Ratio 0.07 0.00 - 0.15 mg/mg[Cre]  SARS Coronavirus 2 by RT PCR (hospital order, performed in [facility] hospital lab) *cepheid single result test* Anterior Nasal Swab     Status: None   Collection Time: 01/04/23 11:43 AM   Specimen: Anterior Nasal Swab  Result Value Ref Range   SARS Coronavirus 2 by RT PCR NEGATIVE NEGATIVE  CBC with Differential/Platelet     Status: Abnormal   Collection Time: 01/04/23 11:54 AM  Result Value Ref Range   WBC 7.7 4.0 - 10.5 K/uL   RBC 3.46 (L) 3.87 - 5.11 MIL/uL   Hemoglobin 10.4 (L) 12.0 - 15.0 g/dL   HCT 10.2 (L) 72.5 - 36.6 %   MCV 88.7 80.0 - 100.0 fL  MCH 30.1 26.0 - 34.0 pg   MCHC 33.9 30.0 - 36.0 g/dL   RDW 16.1 09.6 - 04.5 %   Platelets 296 150 - 400 K/uL   nRBC 0.0 0.0 - 0.2 %   Neutrophils Relative % 70 %   Neutro Abs 5.3 1.7 - 7.7 K/uL   Lymphocytes Relative 20 %   Lymphs Abs 1.6 0.7 - 4.0 K/uL   Monocytes Relative 9 %   Monocytes Absolute 0.7 0.1 - 1.0 K/uL   Eosinophils Relative 0 %   Eosinophils Absolute 0.0 0.0 - 0.5 K/uL   Basophils Relative 0 %   Basophils Absolute 0.0 0.0 - 0.1 K/uL   Immature  Granulocytes 1 %   Abs Immature Granulocytes 0.06 0.00 - 0.07 K/uL  Comprehensive metabolic panel     Status: Abnormal   Collection Time: 01/04/23 11:54 AM  Result Value Ref Range   Sodium 134 (L) 135 - 145 mmol/L   Potassium 3.6 3.5 - 5.1 mmol/L   Chloride 100 98 - 111 mmol/L   CO2 20 (L) 22 - 32 mmol/L   Glucose, Bld 83 70 - 99 mg/dL   BUN 5 (L) 6 - 20 mg/dL   Creatinine, Ser 4.09 0.44 - 1.00 mg/dL   Calcium 9.0 8.9 - 81.1 mg/dL   Total Protein 6.9 6.5 - 8.1 g/dL   Albumin 3.1 (L) 3.5 - 5.0 g/dL   AST 19 15 - 41 U/L   ALT 18 0 - 44 U/L   Alkaline Phosphatase 52 38 - 126 U/L   Total Bilirubin 0.2 (L) 0.3 - 1.2 mg/dL   GFR, Estimated >91 >47 mL/min   Anion gap 14 5 - 15     MDM - VS stable today. Long-term use of procardia,  - UA hazy otherwise normal low suspicion for dehydration  - Baseline PreE labs ordered given history.  - Normal PreE labs, low suspicion for PreE.  - Upon reassessment, patient states that she desires to leave after headache medication and feels much better after seeing and hearing the heartbeat of the baby.  - Labs pending upon discharge, patient to be notified of results via MyChart.  - After further discussion, and given current presentation and clinical picture, suspicion for anxiety induced headache brought on by illicit drug use.  - plan for discharge   Assessment and Plan  -  1. Anxiety during pregnancy in second trimester, antepartum   2. Chronic hypertension affecting pregnancy   3. Supervision of high risk pregnancy in second trimester   4. History of cesarean delivery, currently pregnant   5. [redacted] weeks gestation of pregnancy   6. Physically well but worried   7. Pregnancy headache in second trimester    - Recommended that patient stop marijuana use in pregnancy.  - Reviewed worsening signs and return precautions  - PreE signs and symptoms reviewed.  - Patient discharged home in stable condition and may return to MAU as needed.   Claudette Head, MSN CNM  01/04/2023, 10:43 AM

## 2023-01-04 NOTE — MAU Note (Signed)
Donna Conrad is a 27 y.o. at [redacted]w[redacted]d here in MAU reporting: she's had a HA since last night @ 2230.  States HA continues this morning despite taking Tylenol.  Reports took two Tylenol today @ 0800.  States she doesn't feel well, requesting Covid test.  Also reports she feels increased pelvic. Denies VB or LOF. LMP: NA Onset of complaint: yesterday Pain score: 9 Vitals:   01/04/23 1015  BP: 113/70  Pulse: 93  Resp: 18  Temp: 97.9 F (36.6 C)  SpO2: 100%     FHT:139 bpm Lab orders placed from triage:   UA

## 2023-01-15 ENCOUNTER — Ambulatory Visit (INDEPENDENT_AMBULATORY_CARE_PROVIDER_SITE_OTHER): Payer: Medicaid Other | Admitting: Women's Health

## 2023-01-15 ENCOUNTER — Ambulatory Visit (INDEPENDENT_AMBULATORY_CARE_PROVIDER_SITE_OTHER): Payer: Medicaid Other

## 2023-01-15 ENCOUNTER — Encounter: Payer: Self-pay | Admitting: Women's Health

## 2023-01-15 VITALS — BP 124/86 | HR 84 | Wt 184.5 lb

## 2023-01-15 DIAGNOSIS — Z363 Encounter for antenatal screening for malformations: Secondary | ICD-10-CM

## 2023-01-15 DIAGNOSIS — O10919 Unspecified pre-existing hypertension complicating pregnancy, unspecified trimester: Secondary | ICD-10-CM

## 2023-01-15 DIAGNOSIS — O0992 Supervision of high risk pregnancy, unspecified, second trimester: Secondary | ICD-10-CM | POA: Diagnosis not present

## 2023-01-15 DIAGNOSIS — Z3A19 19 weeks gestation of pregnancy: Secondary | ICD-10-CM | POA: Diagnosis not present

## 2023-01-15 DIAGNOSIS — O34219 Maternal care for unspecified type scar from previous cesarean delivery: Secondary | ICD-10-CM

## 2023-01-15 DIAGNOSIS — O10912 Unspecified pre-existing hypertension complicating pregnancy, second trimester: Secondary | ICD-10-CM

## 2023-01-15 LAB — POCT URINALYSIS DIPSTICK OB
Blood, UA: NEGATIVE
Glucose, UA: NEGATIVE
Ketones, UA: NEGATIVE
Leukocytes, UA: NEGATIVE
Nitrite, UA: NEGATIVE
POC,PROTEIN,UA: NEGATIVE

## 2023-01-15 NOTE — Progress Notes (Signed)
[place] 19 wks,breech,posterior placenta gr 0,normal ovaries,cx 3 cm,SVP of fluid 4.7 cm,FHR 120 bpm,EFW 268 g 44%,anatomy complete,no obvious abnormalities

## 2023-01-15 NOTE — Progress Notes (Signed)
HIGH-RISK PREGNANCY VISIT Patient name: Donna Conrad MRN [id number]  Date of birth: 13-Oct-1995 Chief Complaint:   Routine Prenatal Visit (Ultrasound today)  History of Present Illness:   Donna Conrad is a 27 y.o. G62P0101 female at [redacted]w[redacted]d with an Estimated Date of Delivery: 06/11/23 being seen today for ongoing management of a high-risk pregnancy complicated by chronic hypertension currently on nifedipine 60mg  daily, h/o 30wk C/S for severe pre-e/HELLP.    Today she reports  not eating well, decreased appetite. Can wake up in middle of night and feels head and heart are throbbing fast. Just started zoloft 25mg  for dep/anx 2d ago. Had appt w/ Jamie 7/30, has another tomorrow. Some pressure. . Contractions: Not present. Vag. Bleeding: None.  Movement: Present. denies leaking of fluid.      12/01/2022    9:09 AM  Depression screen PHQ 2/9  Decreased Interest 2  Down, Depressed, Hopeless 2  PHQ - 2 Score 4  Altered sleeping 3  Tired, decreased energy 3  Change in appetite 3  Feeling bad or failure about yourself  3  Trouble concentrating 3  Moving slowly or fidgety/restless 2  Suicidal thoughts 1  PHQ-9 Score 22        12/01/2022    9:10 AM  GAD 7 : Generalized Anxiety Score  Nervous, Anxious, on Edge 3  Control/stop worrying 3  Worry too much - different things 3  Trouble relaxing 3  Restless 0  Easily annoyed or irritable 3  Afraid - awful might happen 3  Total GAD 7 Score 18     Review of Systems:   Pertinent items are noted in HPI Denies abnormal vaginal discharge w/ itching/odor/irritation, headaches, visual changes, shortness of breath, chest pain, abdominal pain, severe nausea/vomiting, or problems with urination or bowel movements unless otherwise stated above. Pertinent History Reviewed:  Reviewed past medical,surgical, social, obstetrical and family history.  Reviewed problem list, medications and allergies. Physical Assessment:   Vitals:   01/15/23 1015   BP: 124/86  Pulse: 84  Weight: 184 lb 8 oz (83.7 kg)  Body mass index is 32.68 kg/m.           Physical Examination:   General appearance: alert, well appearing, and in no distress  Mental status: alert, oriented to person, place, and time  Skin: warm & dry   Extremities: Edema: None    Cardiovascular: normal heart rate noted  Respiratory: normal respiratory effort, no distress  Abdomen: gravid, soft, non-tender  Pelvic: Cervical exam deferred         Fetal Status: Fetal Heart Rate (bpm): 120 u/s   Movement: Present    Fetal Surveillance Testing today: [place] 19 wks,breech,posterior placenta gr 0,normal ovaries,cx 3 cm,SVP of fluid 4.7 cm,FHR 120 bpm,EFW 268 g 44%,anatomy complete,no obvious abnormalities     Chaperone: N/A    Results for orders placed or performed in visit on 01/15/23 (from the past 24 hour(s))  POC Urinalysis Dipstick OB   Collection Time: 01/15/23 11:11 AM  Result Value Ref Range   Color, UA     Clarity, UA     Glucose, UA Negative Negative   Bilirubin, UA     Ketones, UA Negative    Spec Grav, UA     Blood, UA Negative    pH, UA     POC,PROTEIN,UA Negative Negative, Trace, Small (1+), Moderate (2+), Large (3+), 4+   Urobilinogen, UA     Nitrite, UA Negative    Leukocytes, UA Negative  Negative   Appearance     Odor      Assessment & Plan:  High-risk pregnancy: G2P0101 at [redacted]w[redacted]d with an Estimated Date of Delivery: 06/11/23   1) CHTN, stable on nifedipine 60mg  daily, ASA  2) Prev 30wk C/S for severe pre-e/HELLP, wants RCS  3) Dep/anx> decreased appetite and waking up in middle of night w/ ? panic attacks, just started zoloft 25mg  2d ago, discussed can take a few weeks to notice improvements. Keep appt w/ Jamie/IBH tomorrow as scheduled  Meds: No orders of the defined types were placed in this encounter.   Labs/procedures today: U/S  Treatment Plan:   [place] q4wks    2x/wk testing nst/sono @ 32wks     Deliver 37-39wks (36-37wks or prn if poor  control)____   Reviewed: Preterm labor symptoms and general obstetric precautions including but not limited to vaginal bleeding, contractions, leaking of fluid and fetal movement were reviewed in detail with the patient.  All questions were answered. Does have home bp cuff. Office bp cuff given: not applicable. Check bp weekly, let us know if consistently >140 and/or >90.  Follow-up: Return in about 4 weeks (around 02/12/2023) for HROB, US:EFW, MD or CNM.   Future Appointments  Date Time Provider Department Center  01/16/2023 10:15 AM [facility]  02/16/2023  8:30 AM [facility] [place] [facility] None  02/16/2023  9:50 AM Myna Hidalgo, DO [facility]    Orders Placed This Encounter  Procedures   POC Urinalysis Dipstick OB   Cheral Marker CNM, [facility] 01/15/2023 11:25 AM

## 2023-01-15 NOTE — Patient Instructions (Signed)
 Donna Conrad, thank you for choosing our office today! We appreciate the opportunity to meet your healthcare needs. You may receive a short survey by mail, e-mail, or through [employer]. If you are happy with your care we would appreciate if you could take just a few minutes to complete the survey questions. We read all of your comments and take your feedback very seriously. Thank you again for choosing our office.  Center for [employer] Team at [facility] at [facility] ([address] [place], [place] [address]) Entrance C, located off of [address] Free 24/7 valet parking  Go to [email or web address] to register for FREE online childbirth classes  Call the office [phone]) or go to [facility] if: You begin to severe cramping Your water breaks.  Sometimes it is a big gush of fluid, sometimes it is just a trickle that keeps getting your panties wet or running down your legs You have vaginal bleeding.  It is normal to have a small amount of spotting if your cervix was checked.   [facility] Pediatricians/Family Doctors [facility]): [address] Colette Ribas, [phone]           [facility]: [address], [phone]                [facility]): [address], [phone] (call to ask if accepting patients) [facility] Department: [address], [place], [phone]    [facility] Pediatricians/Family Doctors [facility]): [phone] [address], [phone] [facility]: [address] [place], [phone] [facility]: [address], [phone]  [facility] Doctors  [facility]): [phone] [facility]: [address], [phone]   [facility] Doctors [facility]: [address], [phone]  [facility] Doctors  [facility]: [phone], [phone]  Home Blood Pressure Monitoring for Patients   Your provider has recommended that you check your blood pressure (BP) at least once a week at home. If you do not have a blood pressure cuff at home, one will be provided for you. Contact your provider if you have not received your monitor within 1 week.   Helpful Tips for Accurate Home Blood Pressure Checks  Don't smoke, exercise, or drink caffeine 30 minutes before checking your BP Use the restroom before checking your BP (a full bladder can raise your pressure) Relax in a comfortable upright chair Feet on the ground Left arm resting comfortably on a flat surface at the level of your heart Legs uncrossed Back supported Sit quietly and don't talk Place the cuff on your bare arm Adjust snuggly, so that only two fingertips can fit between your skin and the top of the cuff Check 2 readings separated by at least one minute Keep a log of your BP readings For a visual, please reference this diagram: http://ccnc.care/bpdiagram  Provider Name: [facility]     Phone: [phone]  Zone 1: ALL CLEAR  Continue to monitor your symptoms:  BP reading is less than 140 (top number) or less than 90 (bottom number)  No right upper stomach pain No headaches or seeing spots No feeling nauseated or throwing up No swelling in face and hands  Zone 2: CAUTION Call your doctor's office for any of the following:  BP reading is greater than 140 (top number) or greater than  90 (bottom number)  Stomach pain under your ribs in the middle or right side Headaches or seeing spots Feeling nauseated or throwing up Swelling in face and hands  Zone 3: EMERGENCY  Seek immediate medical care if you have any of the following:  BP reading is greater than160 (top number) or greater than 110 (bottom number) Severe headaches not improving with Tylenol Serious difficulty catching your breath Any worsening symptoms from  Zone 2     Second Trimester of Pregnancy The second trimester is from week 14 through week 27 (months 4 through 6). The second trimester is often a time when you feel your best. Your body has adjusted to being pregnant, and you begin to feel better physically. Usually, morning sickness has lessened or quit completely, you may have more energy, and you may have an increase in appetite. The second trimester is also a time when the fetus is growing rapidly. At the end of the sixth month, the fetus is about 9 inches long and weighs about 1 pounds. You will likely begin to feel the baby move (quickening) between 16 and 20 weeks of pregnancy. Body changes during your second trimester Your body continues to go through many changes during your second trimester. The changes vary from woman to woman. Your weight will continue to increase. You will notice your lower abdomen bulging out. You may begin to get stretch marks on your hips, abdomen, and breasts. You may develop headaches that can be relieved by medicines. The medicines should be approved by your health care provider. You may urinate more often because the fetus is pressing on your bladder. You may develop or continue to have heartburn as a result of your pregnancy. You may develop constipation because certain hormones are causing the muscles that push waste through your intestines to slow down. You may develop hemorrhoids or swollen, bulging veins (varicose veins). You may have back pain. This is caused by: Weight gain. Pregnancy hormones that are relaxing the joints in your pelvis. A shift in weight and the muscles that support your balance. Your breasts will continue to grow and they will continue to become tender. Your gums may bleed and may be sensitive to brushing and flossing. Dark spots or blotches (chloasma, mask of pregnancy) may develop on your face. This will likely fade after the baby is born. A dark line from your belly button to  the pubic area (linea nigra) may appear. This will likely fade after the baby is born. You may have changes in your hair. These can include thickening of your hair, rapid growth, and changes in texture. Some women also have hair loss during or after pregnancy, or hair that feels dry or thin. Your hair will most likely return to normal after your baby is born.  What to expect at prenatal visits During a routine prenatal visit: You will be weighed to make sure you and the fetus are growing normally. Your blood pressure will be taken. Your abdomen will be measured to track your baby's growth. The fetal heartbeat will be listened to. Any test results from the previous visit will be discussed.  Your health care provider may ask you: How you are feeling. If you are feeling the baby move. If you have had any abnormal symptoms, such as leaking fluid, bleeding, severe headaches, or abdominal cramping. If you are using any tobacco products, including cigarettes, chewing tobacco, and electronic cigarettes. If you have any questions.  Other tests that may be performed during  your second trimester include: Blood tests that check for: Low iron levels (anemia). High blood sugar that affects pregnant women (gestational diabetes) between 41 and 28 weeks. Rh antibodies. This is to check for a protein on red blood cells (Rh factor). Urine tests to check for infections, diabetes, or protein in the urine. An ultrasound to confirm the proper growth and development of the baby. An amniocentesis to check for possible genetic problems. Fetal screens for spina bifida and Down syndrome. HIV (human immunodeficiency virus) testing. Routine prenatal testing includes screening for HIV, unless you choose not to have this test.  Follow these instructions at home: Medicines Follow your health care provider's instructions regarding medicine use. Specific medicines may be either safe or unsafe to take during  pregnancy. Take a prenatal vitamin that contains at least 600 micrograms (mcg) of folic acid. If you develop constipation, try taking a stool softener if your health care provider approves. Eating and drinking Eat a balanced diet that includes fresh fruits and vegetables, whole grains, good sources of protein such as meat, eggs, or tofu, and low-fat dairy. Your health care provider will help you determine the amount of weight gain that is right for you. Avoid raw meat and uncooked cheese. These carry germs that can cause birth defects in the baby. If you have low calcium intake from food, talk to your health care provider about whether you should take a daily calcium supplement. Limit foods that are high in fat and processed sugars, such as fried and sweet foods. To prevent constipation: Drink enough fluid to keep your urine clear or pale yellow. Eat foods that are high in fiber, such as fresh fruits and vegetables, whole grains, and beans. Activity Exercise only as directed by your health care provider. Most women can continue their usual exercise routine during pregnancy. Try to exercise for 30 minutes at least 5 days a week. Stop exercising if you experience uterine contractions. Avoid heavy lifting, wear low heel shoes, and practice good posture. A sexual relationship may be continued unless your health care provider directs you otherwise. Relieving pain and discomfort Wear a good support bra to prevent discomfort from breast tenderness. Take warm sitz baths to soothe any pain or discomfort caused by hemorrhoids. Use hemorrhoid cream if your health care provider approves. Rest with your legs elevated if you have leg cramps or low back pain. If you develop varicose veins, wear support hose. Elevate your feet for 15 minutes, 3-4 times a day. Limit salt in your diet. Prenatal Care Write down your questions. Take them to your prenatal visits. Keep all your prenatal visits as told by your health  care provider. This is important. Safety Wear your seat belt at all times when driving. Make a list of emergency phone numbers, including numbers for family, friends, the hospital, and police and fire departments. General instructions Ask your health care provider for a referral to a local prenatal education class. Begin classes no later than the beginning of month 6 of your pregnancy. Ask for help if you have counseling or nutritional needs during pregnancy. Your health care provider can offer advice or refer you to specialists for help with various needs. Do not use hot tubs, steam rooms, or saunas. Do not douche or use tampons or scented sanitary pads. Do not cross your legs for long periods of time. Avoid cat litter boxes and soil used by cats. These carry germs that can cause birth defects in the baby and possibly loss of the  fetus by miscarriage or stillbirth. Avoid all smoking, herbs, alcohol, and unprescribed drugs. Chemicals in these products can affect the formation and growth of the baby. Do not use any products that contain nicotine or tobacco, such as cigarettes and e-cigarettes. If you need help quitting, ask your health care provider. Visit your dentist if you have not gone yet during your pregnancy. Use a soft toothbrush to brush your teeth and be gentle when you floss. Contact a health care provider if: You have dizziness. You have mild pelvic cramps, pelvic pressure, or nagging pain in the abdominal area. You have persistent nausea, vomiting, or diarrhea. You have a bad smelling vaginal discharge. You have pain when you urinate. Get help right away if: You have a fever. You are leaking fluid from your vagina. You have spotting or bleeding from your vagina. You have severe abdominal cramping or pain. You have rapid weight gain or weight loss. You have shortness of breath with chest pain. You notice sudden or extreme swelling of your face, hands, ankles, feet, or legs. You  have not felt your baby move in over an hour. You have severe headaches that do not go away when you take medicine. You have vision changes. Summary The second trimester is from week 14 through week 27 (months 4 through 6). It is also a time when the fetus is growing rapidly. Your body goes through many changes during pregnancy. The changes vary from woman to woman. Avoid all smoking, herbs, alcohol, and unprescribed drugs. These chemicals affect the formation and growth your baby. Do not use any tobacco products, such as cigarettes, chewing tobacco, and e-cigarettes. If you need help quitting, ask your health care provider. Contact your health care provider if you have any questions. Keep all prenatal visits as told by your health care provider. This is important. This information is not intended to replace advice given to you by your health care provider. Make sure you discuss any questions you have with your health care provider. Document Released: 05/16/2001 Document Revised: 10/28/2015 Document Reviewed: 07/23/2012 Elsevier Interactive Patient Education  2017 [employer].

## 2023-01-16 ENCOUNTER — Ambulatory Visit: Payer: Medicaid Other | Admitting: Clinical

## 2023-01-16 DIAGNOSIS — Z91199 Patient's noncompliance with other medical treatment and regimen due to unspecified reason: Secondary | ICD-10-CM

## 2023-01-17 ENCOUNTER — Telehealth: Payer: Self-pay | Admitting: Clinical

## 2023-01-17 NOTE — Telephone Encounter (Signed)
Pt has noticed feeling sick after starting Zoloft in daytime (sleepy, throwing up, diarrhea); requesting letter for her employer letting them know she's on a new medication that may affect her functioning until she adjusts to it.   Pt wants to continue taking Zoloft for now; will switch to taking at bedtime instead of during the day, starting Thursday night, as she will have Friday off work and can sleep in.

## 2023-01-18 NOTE — BH Specialist Note (Unsigned)
Integrated Behavioral Health via Telemedicine Visit  02/01/2023 SADDIE VAGO [id number]  Number of Integrated Behavioral Health Clinician visits: 2- Second Visit  Session Start time: 0818   Session End time: 0835  Total time in minutes: 17   Referring Provider: Shawna Clamp, CNM Patient/Family location: Home [facility] Provider location: [facility]  All persons participating in visit: [name] and [facility] Roylee Chaffin   Types of Service: Individual psychotherapy and Video visit  I connected with [name] and/or Merry Proud C Gilbert's  n/a  via  Telephone or Engineer, civil (consulting)  (Video is Surveyor, mining) and verified that I am speaking with the correct person using two identifiers. Discussed confidentiality: Yes   I discussed the limitations of telemedicine and the availability of in person appointments.  Discussed there is a possibility of technology failure and discussed alternative modes of communication if that failure occurs.  I discussed that engaging in this telemedicine visit, they consent to the provision of behavioral healthcare and the services will be billed under their insurance.  Patient and/or legal guardian expressed understanding and consented to Telemedicine visit: Yes   Presenting Concerns: Patient and/or family reports the following symptoms/concerns: No recent panic attacks; pt stopped taking Zoloft due to side effects ("felt sick; the third day was throwing up all day"). Pt has also noticed "heart racing" on BP medication and has "slowed down taking", but still taking sometimes; mixed feelings over quitting second job.  Pt open to referral to psychiatry for future [facility] medication management.  Duration of problem: Current pregnancy; Severity of problem: severe  Patient and/or Family's Strengths/Protective Factors: Social connections, Concrete supports in place (healthy  food, safe environments, etc.), and Sense of purpose  Goals Addressed: Patient will:  Reduce symptoms of: anxiety, depression, insomnia, and stress    Demonstrate ability to: Increase healthy adjustment to current life circumstances and Increase motivation to adhere to plan of care  Progress towards Goals: Ongoing  Interventions: Interventions utilized:  Medication Monitoring and Supportive Reflection Standardized Assessments completed: GAD-7 and PHQ 9  Patient and/or Family Response: Patient agrees with treatment plan.    Assessment: Patient currently experiencing Mood disorder, unspecified.   Patient may benefit from continued therapeutic intervention  .  Plan: Follow up with behavioral health clinician on : Two weeks Behavioral recommendations:  -Continue taking BP medication as prescribed -Continue using self-coping strategies that have remained helpful to manage anxious thoughts (Bible, prayer) -Accept referral to psychiatry -Continue prioritizing healthy self-care (regular meals; adequate rest; practical support from supportive friends and family as needed) Referral(s): Integrated [employer] (In Clinic) and [employer] [facility] (LME/Outside Clinic)  I discussed the assessment and treatment plan with the patient and/or parent/guardian. They were provided an opportunity to ask questions and all were answered. They agreed with the plan and demonstrated an understanding of the instructions.   They were advised to call back or seek an in-person evaluation if the symptoms worsen or if the condition fails to improve as anticipated.  Rae Lips, LCSW     02/01/2023    8:22 AM 12/01/2022    9:09 AM  Depression screen PHQ 2/9  Decreased Interest 1 2  Down, Depressed, Hopeless 2 2  PHQ - 2 Score 3 4  Altered sleeping 0 3  Tired, decreased energy 3 3  Change in appetite 2 3  Feeling bad or failure about yourself  3 3  Trouble concentrating  3 3  Moving slowly or fidgety/restless 0 2  Suicidal thoughts 0 1  PHQ-9 Score 14 22      02/01/2023    8:25 AM 12/01/2022    9:10 AM  GAD 7 : Generalized Anxiety Score  Nervous, Anxious, on Edge 3 3  Control/stop worrying 3 3  Worry too much - different things 3 3  Trouble relaxing 3 3  Restless 0 0  Easily annoyed or irritable 3 3  Afraid - awful might happen 3 3  Total GAD 7 Score 18 18

## 2023-01-22 ENCOUNTER — Encounter: Payer: Self-pay | Admitting: Women's Health

## 2023-01-22 ENCOUNTER — Other Ambulatory Visit: Payer: Self-pay | Admitting: Women's Health

## 2023-02-01 ENCOUNTER — Ambulatory Visit: Payer: Medicaid Other | Admitting: Clinical

## 2023-02-01 DIAGNOSIS — F39 Unspecified mood [affective] disorder: Secondary | ICD-10-CM

## 2023-02-06 NOTE — BH Specialist Note (Signed)
Integrated Behavioral Health via Telemedicine Visit  02/19/2023 MURREL PAVLOFF [id number]  Number of Integrated Behavioral Health Clinician visits: 3- Third Visit  Session Start time: 0916   Session End time: 0945  Total time in minutes: 29   Referring Provider: Shawna Clamp, CNM Patient/Family location: Home [facility] Provider location: [facility]  All persons participating in visit: [name] and [facility] Dellene Mcgroarty   Types of Service: Individual psychotherapy and Video visit  I connected with Donna Conrad and/or Donna Conrad's  n/a  via  Telephone or Engineer, civil (consulting)  (Video is Surveyor, mining) and verified that I am speaking with the correct person using two identifiers. Discussed confidentiality: Yes   I discussed the limitations of telemedicine and the availability of in person appointments.  Discussed there is a possibility of technology failure and discussed alternative modes of communication if that failure occurs.  I discussed that engaging in this telemedicine visit, they consent to the provision of behavioral healthcare and the services will be billed under their insurance.  Patient and/or legal guardian expressed understanding and consented to Telemedicine visit: Yes   Presenting Concerns: Patient and/or family reports the following symptoms/concerns: Worries about dental issue (sensitive gums/bleeding), craving ice, lack of quality sleep; daughter's ADHD, etc. Pt plans to begin taking BP medication again; open to implementing self-coping strategy.  Duration of problem: Current pregnancy; Severity of problem:  moderately severe  Patient and/or Family's Strengths/Protective Factors: Social connections, Concrete supports in place (healthy food, safe environments, etc.), and Sense of purpose   Goals Addressed: Patient will:  Reduce symptoms of: anxiety, depression,  insomnia, and stress   Increase knowledge and/or ability of: self-management skills   Demonstrate ability to: Increase healthy adjustment to current life circumstances and Increase motivation to adhere to plan of care  Progress towards Goals: Ongoing  Interventions: Interventions utilized:  Solution-Focused Strategies Standardized Assessments completed: GAD-7 and PHQ 9  Patient and/or Family Response: Patient agrees with treatment plan.  Assessment: Patient currently experiencing Mood disorder, unspecified .   Patient may benefit from continued therapeutic intervention  .  Plan: Follow up with behavioral health clinician on : Two weeks Behavioral recommendations:  -Begin taking BP medication and prenatal vitamins as prescribed  -Continue daily self-coping strategies (Bible/prayer) and healthy self-care (regular meals, adequate rest; practical support from supportive friends/family, as needed) -Begin Worry Time strategy, as discussed. Start by setting up start and end time reminders on phone today; continue daily for two weeks. -Consider adding iron-rich foods to daily meals -Continue plan to discuss tooth sensitivity with dentist and ob/gyn providers -Continue plan to attend upcoming psychiatry appointment -Consider pediatric therapy options (on After Visit Summary) Referral(s): Integrated [employer] (In Clinic)  I discussed the assessment and treatment plan with the patient and/or parent/guardian. They were provided an opportunity to ask questions and all were answered. They agreed with the plan and demonstrated an understanding of the instructions.   They were advised to call back or seek an in-person evaluation if the symptoms worsen or if the condition fails to improve as anticipated.  Rae Lips, LCSW     02/19/2023    9:26 AM 02/01/2023    8:22 AM 12/01/2022    9:09 AM  Depression screen PHQ 2/9  Decreased Interest 0 1 2  Down, Depressed, Hopeless  1 2 2   PHQ - 2 Score 1 3 4   Altered sleeping 3 0 3  Tired, decreased energy 3 3 3   Change in appetite 2 2 3   Feeling bad or failure about yourself  2 3 3   Trouble concentrating 2 3 3   Moving slowly or fidgety/restless 0 0 2  Suicidal thoughts 0 0 1  PHQ-9 Score 13 14 22       02/19/2023    9:28 AM 02/01/2023    8:25 AM 12/01/2022    9:10 AM  GAD 7 : Generalized Anxiety Score  Nervous, Anxious, on Edge 3 3 3   Control/stop worrying 2 3 3   Worry too much - different things 2 3 3   Trouble relaxing 2 3 3   Restless 1 0 0  Easily annoyed or irritable 3 3 3   Afraid - awful might happen 3 3 3   Total GAD 7 Score 16 18 18

## 2023-02-07 ENCOUNTER — Other Ambulatory Visit (HOSPITAL_COMMUNITY)
Admission: RE | Admit: 2023-02-07 | Discharge: 2023-02-07 | Disposition: A | Discharge status: Home / Self Care | Payer: Medicaid Other | Source: Ambulatory Visit | Attending: Obstetrics and Gynecology | Admitting: Obstetrics and Gynecology

## 2023-02-07 ENCOUNTER — Ambulatory Visit (INDEPENDENT_AMBULATORY_CARE_PROVIDER_SITE_OTHER): Payer: Medicaid Other | Admitting: Obstetrics and Gynecology

## 2023-02-07 ENCOUNTER — Encounter: Payer: Self-pay | Admitting: Obstetrics and Gynecology

## 2023-02-07 VITALS — BP 128/82 | HR 97 | Wt 187.5 lb

## 2023-02-07 DIAGNOSIS — N898 Other specified noninflammatory disorders of vagina: Secondary | ICD-10-CM | POA: Diagnosis present

## 2023-02-07 DIAGNOSIS — O34219 Maternal care for unspecified type scar from previous cesarean delivery: Secondary | ICD-10-CM

## 2023-02-07 DIAGNOSIS — O09299 Supervision of pregnancy with other poor reproductive or obstetric history, unspecified trimester: Secondary | ICD-10-CM

## 2023-02-07 DIAGNOSIS — B009 Herpesviral infection, unspecified: Secondary | ICD-10-CM

## 2023-02-07 DIAGNOSIS — O0992 Supervision of high risk pregnancy, unspecified, second trimester: Secondary | ICD-10-CM

## 2023-02-07 DIAGNOSIS — F418 Other specified anxiety disorders: Secondary | ICD-10-CM

## 2023-02-07 DIAGNOSIS — O10919 Unspecified pre-existing hypertension complicating pregnancy, unspecified trimester: Secondary | ICD-10-CM

## 2023-02-07 DIAGNOSIS — Z3A22 22 weeks gestation of pregnancy: Secondary | ICD-10-CM

## 2023-02-07 NOTE — Progress Notes (Signed)
PRENATAL VISIT NOTE  Subjective:  Donna Conrad is a 27 y.o. G2P0101 at [redacted]w[redacted]d being seen today for ongoing prenatal care.  She is currently monitored for the following issues for this high-risk pregnancy and has Chronic hypertension affecting pregnancy; History of pre-eclampsia in prior pregnancy, currently pregnant; History of HELLP syndrome, currently pregnant; Supervision of high-risk pregnancy; History of cesarean delivery, currently pregnant; Prediabetes; Herpes; Depression with anxiety; and Carrier of spinal muscular atrophy on their problem list.  Patient reports  some vaginal discharge .  Contractions: Not present. Vag. Bleeding: None.  Movement: Present. Denies leaking of fluid.   The following portions of the patient's history were reviewed and updated as appropriate: allergies, current medications, past family history, past medical history, past social history, past surgical history and problem list.   Objective:   Vitals:   02/07/23 1531  BP: 128/82  Pulse: 97  Weight: 187 lb 8 oz (85 kg)    Fetal Status: Fetal Heart Rate (bpm): 148 Fundal Height: 23 cm Movement: Present     General:  Alert, oriented and cooperative. Patient is in no acute distress.  Skin: Skin is warm and dry. No rash noted.   Cardiovascular: Normal heart rate noted  Respiratory: Normal respiratory effort, no problems with respiration noted  Abdomen: Soft, gravid, appropriate for gestational age.  Pain/Pressure: Absent     Pelvic: Cervical exam deferred CV swab collected        Extremities: Normal range of motion.  Edema: None  Mental Status: Normal mood and affect. Normal behavior. Normal judgment and thought content.   Assessment and Plan:  Pregnancy: G2P0101 at [redacted]w[redacted]d 1. Encounter for supervision of high risk pregnancy in second trimester, antepartum BP and FHR normal Feeling regular fetal movement FH appropriate  2. Chronic hypertension affecting pregnancy Not taking nifedipine, reports it  makes her heart race Normotensive today Precautions discussed, check BP at home, notify if 140s-90s, discussed when to go to MAU  3. History of cesarean delivery, currently pregnant 4. History of HELLP syndrome, currently pregnant C/s 30wks d/t severe pre-e w/ HELLP. Planning RCS  5. Depression with anxiety Seeing Jamie  On zoloft   6. [redacted] weeks gestation of pregnancy  7. HSV  On daily suppression  8. Vaginal discharge Will notify of results and/or tx  - Cervicovaginal ancillary only  Preterm labor symptoms and general obstetric precautions including but not limited to vaginal bleeding, contractions, leaking of fluid and fetal movement were reviewed in detail with the patient. Please refer to After Visit Summary for other counseling recommendations.   Return in two weeks   Future Appointments  Date Time Provider Department Center  02/16/2023  8:30 AM [facility]TO[facility] [place] [facility] None  02/16/2023  9:50 AM Myna Hidalgo, DO [facility]-FT FTOBGYN  02/19/2023  9:15 AM [facility]  03/16/2023  8:30 AM [facility] - FTOBGYN [place] [facility] None  03/16/2023  9:30 AM Myna Hidalgo, DO [facility]-FT FTOBGYN  04/13/2023  8:30 AM [facility] - FTOBGYN [place] [facility] None  04/17/2023  9:30 AM [facility]-FTOBGYN NURSE [facility] FTOBGYN  04/24/2023  9:30 AM [facility]FTOBGYN NURSE [facility] FTOBGYN  04/27/2023  8:30 AM [facility] - FTOBGYN [place] [facility] None  05/01/2023  9:30 AM [facility]-FTOBGYN NURSE [facility] FTOBGYN  05/08/2023  9:30 AM [facility]FTOBGYN NURSE [facility]FT FTOBGYN  05/11/2023  8:30 AM [facility] - FTOBGYN [place] [facility] None  05/15/2023  9:30 AM [facility]-FTOBGYN NURSE [facility]-FT FTOBGYN  05/18/2023 10:00 AM [facility] - FTOBGYN [place] [facility] None  05/22/2023  9:30 AM  [facility]-FTOBGYN NURSE [facility] FTOBGYN  05/25/2023 10:00 AM [facility] - FTOBGYN [place] [facility] None  06/01/2023 10:00 AM [facility] - FTOBGYN [place] [facility] None  06/05/2023  9:30 AM [facility]-FTOBGYN NURSE [facility]-FT FTOBGYN  06/08/2023  8:30 AM [facility] - FTOBGYN [place] [facility] None  06/12/2023  9:30 AM [facility]-FTOBGYN NURSE  [facility]-FT FTOBGYN    Albertine Grates, FNP

## 2023-02-09 LAB — CERVICOVAGINAL ANCILLARY ONLY
Bacterial Vaginitis (gardnerella): POSITIVE — AB
Candida Glabrata: NEGATIVE
Candida Vaginitis: NEGATIVE
Comment: NEGATIVE
Comment: NEGATIVE
Comment: NEGATIVE

## 2023-02-11 MED ORDER — METRONIDAZOLE 500 MG PO TABS
500.0000 mg | ORAL_TABLET | Freq: Two times a day (BID) | ORAL | 0 refills | Status: AC
Start: 2023-02-11 — End: 2023-02-18

## 2023-02-11 NOTE — Addendum Note (Signed)
Addended by: Sue Lush on: 02/11/2023 02:52 PM   Modules accepted: Orders

## 2023-02-15 ENCOUNTER — Other Ambulatory Visit: Payer: Self-pay | Admitting: Obstetrics & Gynecology

## 2023-02-15 DIAGNOSIS — O10919 Unspecified pre-existing hypertension complicating pregnancy, unspecified trimester: Secondary | ICD-10-CM

## 2023-02-16 ENCOUNTER — Ambulatory Visit (INDEPENDENT_AMBULATORY_CARE_PROVIDER_SITE_OTHER): Payer: Medicaid Other | Admitting: Obstetrics & Gynecology

## 2023-02-16 ENCOUNTER — Encounter: Payer: Self-pay | Admitting: Obstetrics & Gynecology

## 2023-02-16 ENCOUNTER — Ambulatory Visit (INDEPENDENT_AMBULATORY_CARE_PROVIDER_SITE_OTHER): Payer: Medicaid Other

## 2023-02-16 VITALS — BP 130/89 | HR 80 | Wt 186.0 lb

## 2023-02-16 DIAGNOSIS — Z3A23 23 weeks gestation of pregnancy: Secondary | ICD-10-CM

## 2023-02-16 DIAGNOSIS — O10919 Unspecified pre-existing hypertension complicating pregnancy, unspecified trimester: Secondary | ICD-10-CM

## 2023-02-16 DIAGNOSIS — O0992 Supervision of high risk pregnancy, unspecified, second trimester: Secondary | ICD-10-CM

## 2023-02-16 DIAGNOSIS — O34219 Maternal care for unspecified type scar from previous cesarean delivery: Secondary | ICD-10-CM

## 2023-02-16 DIAGNOSIS — O10912 Unspecified pre-existing hypertension complicating pregnancy, second trimester: Secondary | ICD-10-CM | POA: Diagnosis not present

## 2023-02-16 DIAGNOSIS — O09299 Supervision of pregnancy with other poor reproductive or obstetric history, unspecified trimester: Secondary | ICD-10-CM

## 2023-02-16 DIAGNOSIS — O09292 Supervision of pregnancy with other poor reproductive or obstetric history, second trimester: Secondary | ICD-10-CM

## 2023-02-16 NOTE — Progress Notes (Signed)
[place] 23+4 wks,breech,posterior placenta gr 0,normal ovaries,CX 2.8 cm,SVP of fluid 5.6 cm,FHR 146 bpm,EFW 629 g 51%

## 2023-02-16 NOTE — Progress Notes (Signed)
HIGH-RISK PREGNANCY VISIT Patient name: Donna Conrad MRN [id number]  Date of birth: Nov 12, 1995 Chief Complaint:   Routine Prenatal Visit  History of Present Illness:   Donna Conrad is a 27 y.o. G55P0101 female at [redacted]w[redacted]d with an Estimated Date of Delivery: 06/11/23 being seen today for ongoing management of a high-risk pregnancy complicated by :  -Chronic HTN: She reports that she does not regularly take her medication.  She notes that sometimes she feels like it makes her heart flutter, but she is willing to restart the medicine and see how things go  -Prior C-section: States that she wants a repeat section because she is scared.  In higher pregnancy she did not labor due to preeclampsia/HELLP.    Today she reports no complaints.   Contractions: Not present. Vag. Bleeding: None.  Movement: Present. denies leaking of fluid.      02/01/2023    8:22 AM 12/01/2022    9:09 AM  Depression screen PHQ 2/9  Decreased Interest 1 2  Down, Depressed, Hopeless 2 2  PHQ - 2 Score 3 4  Altered sleeping 0 3  Tired, decreased energy 3 3  Change in appetite 2 3  Feeling bad or failure about yourself  3 3  Trouble concentrating 3 3  Moving slowly or fidgety/restless 0 2  Suicidal thoughts 0 1  PHQ-9 Score 14 22     Current Outpatient Medications  Medication Instructions   aspirin EC 162 mg, Oral, Daily, Take after 12 weeks for prevention of preeclampsia later in pregnancy   Blood Pressure Monitor MISC For regular home bp monitoring during pregnancy   metroNIDAZOLE (FLAGYL) 500 mg, Oral, 2 times daily   NIFEdipine (PROCARDIA XL/NIFEDICAL XL) 60 mg, Oral, Daily   Prenatal Vit-Fe Fumarate-FA (PRENATAL VITAMIN PO) Oral   sertraline (ZOLOFT) 25 mg, Oral, Daily   valACYclovir (VALTREX) 500 mg, Oral, 2 times daily     Review of Systems:   Pertinent items are noted in HPI Denies abnormal vaginal discharge w/ itching/odor/irritation, headaches, visual changes, shortness of breath, chest pain,  abdominal pain, severe nausea/vomiting, or problems with urination or bowel movements unless otherwise stated above. Pertinent History Reviewed:  Reviewed past medical,surgical, social, obstetrical and family history.  Reviewed problem list, medications and allergies. Physical Assessment:   Vitals:   02/16/23 0909  BP: 130/89  Pulse: 80  Weight: 186 lb (84.4 kg)  Body mass index is 32.95 kg/m.           Physical Examination:   General appearance: alert, well appearing, and in no distress  Mental status: normal mood, behavior, speech, dress, motor activity, and thought processes  Skin: warm & dry   Extremities: Edema: None    Cardiovascular: normal heart rate noted  Respiratory: normal respiratory effort, no distress  Abdomen: gravid, soft, non-tender  Pelvic: Cervical exam deferred         Fetal Status:     Movement: Present    Fetal Surveillance Testing today: breech,posterior placenta gr 0,normal ovaries,CX 2.8 cm,SVP of fluid 5.6 cm,FHR 146 bpm,EFW 629 g 51%    Chaperone: N/A    No results found for this or any previous visit (from the past 24 hour(s)).   Assessment & Plan:  High-risk pregnancy: G2P0101 at [redacted]w[redacted]d with an Estimated Date of Delivery: 06/11/23   1) Chronic HTN -Plan to restart Procardia 60 XL daily and monitor BP -Discussed with patient potential transition to alternative medication should she note considerable side effects -Continue growth every  4 weeks -Plan to start antepartum testing at 32 weeks  2) Prior C-section Discussion with patient regarding pros cons of repeat C-section versus TOLAC.  Reviewed potential complications of multiple C-sections such as scar tissue risk of injury to surrounding organs as well as future risk of placenta accreta spectrum.  Again patient voiced her concern for being scared to push out a baby and potential pain.  Discussed that we will review this in future visits  Meds: No orders of the defined types were placed in this  encounter.   Labs/procedures today: Scan  Treatment Plan:  PN2 and growth scan next visit  Reviewed: Preterm labor symptoms and general obstetric precautions including but not limited to vaginal bleeding, contractions, leaking of fluid and fetal movement were reviewed in detail with the patient.  All questions were answered. Pt has home bp cuff. Check bp weekly, let us know if >140/90.   Follow-up: Return in about 4 weeks (around 03/16/2023) for HROB visit and PN2.   Future Appointments  Date Time Provider Department Center  02/16/2023  9:50 AM Myna Hidalgo, DO [facility]-FT FTOBGYN  02/19/2023  9:15 AM [facility]  03/12/2023 10:00 AM Elsie Lincoln, MD BH-BHRA None  03/16/2023  8:30 AM [facility] - FTOBGYN [place] [facility] None  03/16/2023  9:30 AM Myna Hidalgo, DO [facility]FT FTOBGYN  04/13/2023  8:30 AM [facility] - FTOBGYN [place] [facility] None  04/17/2023  9:30 AM [facility]-FTOBGYN NURSE [facility] FTOBGYN  04/24/2023  9:30 AM [facility]FTOBGYN NURSE [facility] FTOBGYN  04/27/2023  8:30 AM [facility] - FTOBGYN [place] [facility] None  05/01/2023  9:30 AM [facility]-FTOBGYN NURSE [facility] FTOBGYN  05/08/2023  9:30 AM [facility]FTOBGYN NURSE [facility]FT FTOBGYN  05/11/2023  8:30 AM [facility] - [facility]YN [place] [facility] None  05/15/2023  9:30 AM [facility]FTOBGYN NURSE [facility] FTOBGYN  05/18/2023 10:00 AM [facility] - [facility]TO[facility]YN [place] [facility] None  05/22/2023  9:30 AM [facility]FTOBGYN NURSE [facility] FTOBGYN  05/25/2023 10:00 AM [facility] - [facility]YN [place] [facility] None  06/01/2023 10:00 AM [facility] - F[facility]YN [place] [facility] None  06/05/2023  9:30 AM [facility]-FTOBGYN NURSE [facility] FTOBGYN  06/08/2023  8:30 AM [facility] - FTOBGYN [place] [facility] None  06/12/2023  9:30 AM [facility]-FTOBGYN NURSE [facility]-FT FTOBGYN    Orders Placed This Encounter  Procedures   US OB Follow Up    Myna Hidalgo, DO Attending Obstetrician & Gynecologist, Faculty Practice Center for [employer], [facility]

## 2023-02-19 ENCOUNTER — Ambulatory Visit (INDEPENDENT_AMBULATORY_CARE_PROVIDER_SITE_OTHER): Payer: Medicaid Other | Admitting: Clinical

## 2023-02-19 DIAGNOSIS — F39 Unspecified mood [affective] disorder: Secondary | ICD-10-CM

## 2023-02-19 NOTE — Patient Instructions (Addendum)
Center for [facility] at [facility] [address] [place], [place] [address] [phone] (main office) [phone] [facility] office)   Some pediatric therapist options:   [facility]                                                                                    [email or web address] [address], [place], [place] [address]                                   Ph: [phone]                                          Fax: [phone]                       [email or web address]  [facility] [email or web address] [address] [place], [place] [address] Ph. [phone]                          Fax. [phone]  [facility] [email or web address] [address] [place], [place] [address]   Fax [phone]  Phone: [phone]  [facility] [address]  [place], [place] [address]  Ph: [phone] Fax: [phone] [email or web address]  [facility]                           [email or web address] [address] [place], [place], [place] [address]         Ph: [phone]                                         Fax: [phone]                                      Email:  [email or web address]  [facility]                    [email or web address]://[email or web address] [address], [place], [place] [address]                                   Ph: [phone] / [phone] Fax: [phone] // For [facility] directly, phone is [phone] and fax is [phone]  [facility] [address]                              [place], [address]             [phone]  Pheobe Ivey: Intake                 [phone]    [id number]$ [facility])                LimitBuy.nl                            [address] [place], [place] [address]  Ph: [phone]                                         [facility]                    [email or web address] [place]: [address], [place], [place] [address]                                 Ph: [phone]; Fax: [phone]      [place]: [address], [place], [place] [address]                                                        Ph: [phone]; Fax: [phone]  [facility]                                               [email or web address] [place]: [address] [place], [place] [address]                                 [place]: [address] [place], [place] [address]                                                              Ph: [phone];  Fax: [phone]    Email: [email or web address]  [facility]                                [email or web address] [address], [place], [place] [address]                                                                Ph: [phone]; Fax: [phone]        [facility]/ Dionicia Abler, LPC, LCAS  [email or web address] [address] [place], [place] [address]        Ph: [phone]/ [phone]             Fax: [phone]  [facility]                           [email or web address] Address: [address] Mervyn Skeeters [place], [place] [address]                            Ph: [phone];  Fax: [phone]  [facility], [facility] Fax [phone] Address: [address] [place], [place] [address] Phone: [phone]  [facility] [address]  [place], [place] [address]  [phone] P  [phone]  [facility]          [email or web address] [address] [place], [place] [address]                 Ph: [phone]                                          Fax: [phone]                                      [facility]                 [email or web address] [address] [place], [place] [address]  PH:[phone] Fax: [phone]  [facility]                                   [email or web address] [address] [place], 2nd floor, [place], [place] [address]                                Ph: [phone]; fax: [phone]      Hours: M-Th 830a-8p; Fri 930a-7p  [facility]                                        [email or web address] [address] [place] [address], [place] [address]                       Ph: [phone]                       Fax: [phone]                             [facility]                                                [email or web address] [address], [place], [place] [address]      Main phone: [phone]  [facility] [address] Elaina Hoops [place], [place] [address] Fax: [phone] Phone: [phone]

## 2023-02-20 NOTE — BH Specialist Note (Signed)
Integrated Behavioral Health via Telemedicine Visit  02/20/2023 MYKEISHA DYSERT [id number]  Number of Integrated Behavioral Health Clinician visits: 3- Third Visit  Session Start time: 0916   Session End time: 0945  Total time in minutes: 29   Referring Provider: Shawna Clamp, CNM Patient/Family location: Home [facility] Provider location: [facility]  All persons participating in visit: Patient Henley Blyth and [facility] Edis Huish   Types of Service: Individual psychotherapy and Video visit  I connected with [name] and/or Merry Proud C Konicki's  n/a  via  Telephone or Engineer, civil (consulting)  (Video is Surveyor, mining) and verified that I am speaking with the correct person using two identifiers. Discussed confidentiality: Yes   I discussed the limitations of telemedicine and the availability of in person appointments.  Discussed there is a possibility of technology failure and discussed alternative modes of communication if that failure occurs.  I discussed that engaging in this telemedicine visit, they consent to the provision of behavioral healthcare and the services will be billed under their insurance.  Patient and/or legal guardian expressed understanding and consented to Telemedicine visit: Yes   Presenting Concerns: Patient and/or family reports the following symptoms/concerns: Sometimes forgetting to take BP medicine and "A little paranoid" (anxious) as birth gets closer; noticing improved mood compared to last pregnancy; attributes to working, moving around and getting out of the house more; feeling more prepared this time. Pt's daughter is now being treated for her ADHD, giving some peace of mind. Pt able to fall asleep early evening; wakes up about 1am and unable to fall back asleep.  Duration of problem: Current pregnancy; Severity of problem: moderate  Patient and/or Family's  Strengths/Protective Factors: Social connections, Concrete supports in place (healthy food, safe environments, etc.), and Sense of purpose  Goals Addressed: Patient will:  Reduce symptoms of: anxiety, depression, insomnia, and stress   Increase knowledge and/or ability of: healthy habits   Demonstrate ability to: Increase healthy adjustment to current life circumstances and Increase motivation to adhere to plan of care  Progress towards Goals: Ongoing  Interventions: Interventions utilized:  Motivational Interviewing and Supportive Reflection Standardized Assessments completed: Not Needed  Patient and/or Family Response: Patient agrees with treatment plan.   Assessment: Patient currently experiencing Mood disorder, unspecified.   Patient may benefit from continued therapeutic intervention  .  Plan: Follow up with behavioral health clinician on : One month Behavioral recommendations:  -Set timer on phone today, for morning medication reminder (prenatal, Zoloft, Procardia) -Continue going to sleep early evening; taking brief nap as needed daytime -Continue plans to attend all of nephew's home football games this season -Continue plan to attend all upcoming appointments for psychiatry and ob/gyn; follow medical recommendations -Continue using self-coping/self-care strategies that have remained helpful (prayer, Bible-reading, regular meals, adequate rest; allowing practical support from support persons) -Continue discussion with dentist and ob/gyn providers regarding tooth/gum issues -Read through Postpartum Planner on After Visit Summary; use as needed Referral(s): Integrated [employer] (In Clinic)   I discussed the assessment and treatment plan with the patient and/or parent/guardian. They were provided an opportunity to ask questions and all were answered. They agreed with the plan and demonstrated an understanding of the instructions.   They were advised to  call back or seek an in-person evaluation if the symptoms worsen or if the condition fails to improve as anticipated.  Rae Lips, LCSW     02/19/2023  9:26 AM 02/01/2023    8:22 AM 12/01/2022    9:09 AM  Depression screen PHQ 2/9  Decreased Interest 0 1 2  Down, Depressed, Hopeless 1 2 2   PHQ - 2 Score 1 3 4   Altered sleeping 3 0 3  Tired, decreased energy 3 3 3   Change in appetite 2 2 3   Feeling bad or failure about yourself  2 3 3   Trouble concentrating 2 3 3   Moving slowly or fidgety/restless 0 0 2  Suicidal thoughts 0 0 1  PHQ-9 Score 13 14 22       02/19/2023    9:28 AM 02/01/2023    8:25 AM 12/01/2022    9:10 AM  GAD 7 : Generalized Anxiety Score  Nervous, Anxious, on Edge 3 3 3   Control/stop worrying 2 3 3   Worry too much - different things 2 3 3   Trouble relaxing 2 3 3   Restless 1 0 0  Easily annoyed or irritable 3 3 3   Afraid - awful might happen 3 3 3   Total GAD 7 Score 16 18 18

## 2023-03-05 ENCOUNTER — Ambulatory Visit (INDEPENDENT_AMBULATORY_CARE_PROVIDER_SITE_OTHER): Payer: Medicaid Other | Admitting: Clinical

## 2023-03-05 DIAGNOSIS — F39 Unspecified mood [affective] disorder: Secondary | ICD-10-CM

## 2023-03-05 NOTE — Patient Instructions (Signed)
[facility] [address] [place], [place] [address] [phone] (main office) [phone] (Eknoor Novack's office)     BRAINSTORMING  Develop a Plan Goals: Provide a way to start conversation about your new life with a baby Assist parents in recognizing and using resources within their reach Help pave the way before birth for an easier period of transition afterwards.  Make a list of the following information to keep in a central location: Full name of Mom and Partner: _____________________________________________ Baby's full name and Date of Birth: ___________________________________________ Home Address: ___________________________________________________________ ________________________________________________________________________ Home Phone: ____________________________________________________________ Parents' cell numbers: _____________________________________________________ ________________________________________________________________________ Name and contact info for OB: ______________________________________________ Name and contact info for Pediatrician:________________________________________ Contact info for Lactation Consultants: ________________________________________  REST and SLEEP *You each need at least 4-5 hours of uninterrupted sleep every day. Write specific names and contact information.* How are you going to rest in the postpartum period? While partner's home? When partner returns to work? When you both return to work? Where will your baby sleep? Who is available to help during the day? Evening? Night? Who could move in for a period to help support you? What are some ideas to help you get enough  sleep? __________________________________________________________________________________________________________________________________________________________________________________________________________________________________________ NUTRITIOUS FOOD AND DRINK *Plan for meals before your baby is born so you can have healthy food to eat during the immediate postpartum period.* Who will look after breakfast? Lunch? Dinner? List names and contact information. Brainstorm quick, healthy ideas for each meal. What can you do before baby is born to prepare meals for the postpartum period? How can others help you with meals? Which grocery stores provide online shopping and delivery? Which restaurants offer take-out or delivery options? ______________________________________________________________________________________________________________________________________________________________________________________________________________________________________________________________________________________________________________________________________________________________________________________________________  CARE FOR MOM *It's important that mom is cared for and pampered in the postpartum period. Remember, the most important ways new mothers need care are: sleep, nutrition, gentle exercise, and time off.* Who can come take care of mom during this period? Make a list of people with their contact information. List some activities that make you feel cared for, rested, and energized? Who can make sure you have opportunities to do these things? Does mom have a space of her very own within your home that's just for her? Make a "Mama Cave" where she can be comfortable, rest, and renew herself  daily. ______________________________________________________________________________________________________________________________________________________________________________________________________________________________________________________________________________________________________________________________________________________________________________________________________    CARE FOR AND FEEDING BABY *Knowledgeable and encouraging people will offer the [place] support with regard to feeding your baby.* Educate yourself and choose the [place] feeding option for your baby. Make a list of people who will guide, support, and be a resource for you as your care for and feed your baby. (Friends that have breastfed or are currently breastfeeding, lactation consultants, breastfeeding support groups, etc.) Consider a postpartum doula. (These websites can give you information: dona.org & padanc.org) Seek out local breastfeeding resources like the breastfeeding support group at [facility] or [employer]. ______________________________________________________________________________________________________________________________________________________________________________________________________________________________________________________________________________________________________________________________________________________________________________________________________  CHORES AND ERRANDS Who can help with a thorough cleaning before baby is born? Make a list of people who will help with housekeeping and chores, like laundry, light cleaning, dishes, bathrooms, etc. Who can run some errands for you? What can you do to make sure you are stocked with basic supplies before baby is born? Who is going to do the  shopping? ______________________________________________________________________________________________________________________________________________________________________________________________________________________________________________________________________________________________________________________________________________________________________________________________________     Family Adjustment *Nurture yourselves.it helps parents be more loving and allows for better bonding with their child.* What sorts of things do you and partner enjoy   doing together? Which activities help you to connect and strengthen your relationship? Make a list of those things. Make a list of people whom you trust to care for your baby so you can have some time together as a couple. What types of things help partner feel connected to Mom? Make a list. What needs will partner have in order to bond with baby? Other children? Who will care for them when you go into labor and while you are in the hospital? Think about what the needs of your older children might be. Who can help you meet those needs? In what ways are you helping them prepare for bringing baby home? List some specific strategies you have for family adjustment. _______________________________________________________________________________________________________________________________________________________________________________________________________________________________________________________________________________________________________________________________________________  SUPPORT *Someone who can empathize with experiences normalizes your problems and makes them more bearable.* Make a list of other friends, neighbors, and/or co-workers you know with infants (and small children, if applicable) with whom you can connect. Make a list of local or online support groups, mom groups, etc. in which you can be  involved. ______________________________________________________________________________________________________________________________________________________________________________________________________________________________________________________________________________________________________________________________________________________________________________________________________  Childcare Plans Investigate and plan for childcare if mom is returning to work. Talk about mom's concerns about her transition back to work. Talk about partner's concerns regarding this transition.  Mental Health *Your mental health is one of the highest priorities for a pregnant or postpartum mom.* 1 in 5 women experience anxiety and/or depression from the time of conception through the first year after birth. Postpartum Mood Disorders are the #1 complication of pregnancy and childbirth and the suffering experienced by these mothers is not necessary! These illnesses are temporary and respond well to treatment, which often includes self-care, social support, talk therapy, and medication when needed. Women experiencing anxiety and depression often say things like: "I'm supposed to be happy.why do I feel so sad?", "Why can't I snap out of it?", "I'm having thoughts that scare me." There is no need to be embarrassed if you are feeling these symptoms: Overwhelmed, anxious, angry, sad, guilty, irritable, hopeless, exhausted but can't sleep You are NOT alone. You are NOT to blame. With help, you WILL be well. Where can I find help? Medical professionals such as your OB, midwife, gynecologist, family practitioner, primary care provider, pediatrician, or mental health providers; [facility] support groups: Feelings After Birth, Breastfeeding Support Group, Baby and Me Group, and Fit 4 Two exercise classes. You have permission to ask for help. It will confirm your feelings, validate your experiences,  share/learn coping strategies, and gain support and encouragement as you heal. You are important! BRAINSTORM Make a list of local resources, including resources for mom and for partner. Identify support groups. Identify people to call late at night - include names and contact info. Talk with partner about perinatal mood and anxiety disorders. Talk with your OB, midwife, and doula about baby blues and about perinatal mood and anxiety disorders. Talk with your pediatrician about perinatal mood and anxiety disorders.   Support & Sanity Savers   What do you really need?  Basics In preparing for a new baby, many expectant parents spend hours shopping for baby clothes, decorating the nursery, and deciding which car seat to buy. Yet most don't think much about what the reality of parenting a newborn will be like, and what they need to make it through that. So, here is the advice of experienced parents. We know you'll read this, and think "they're exaggerating, I don't really need that." Just trust us on these, OK? Plan for all of   this, and if it turns out you don't need it, come back and teach us how you did it!  Must-Haves (Once baby's survival needs are met, make sure you attend to your own survival needs!) Sleep An average newborn sleeps 16-18 hours per day, over 6-7 sleep periods, rarely more than three hours at a time. It is normal and healthy for a newborn to wake throughout the night... but really hard on parents!! Naps. Prioritize sleep above any responsibilities like: cleaning house, visiting friends, running errands, etc.  Sleep whenever baby sleeps. If you can't nap, at least have restful times when baby eats. The more rest you get, the more patient you will be, the more emotionally stable, and better at solving problems.  Food You may not have realized it would be difficult to eat when you have a newborn. Yet, when we talk to countless new parents, they say things like "it may be 2:00 pm  when I realize I haven't had breakfast yet." Or "every time we sit down to dinner, baby needs to eat, and my food gets cold, so I don't bother to eat it." Finger food. Before your baby is born, stock up with one months' worth of food that: 1) you can eat with one hand while holding a baby, 2) doesn't need to be prepped, 3) is good hot or cold, 4) doesn't spoil when left out for a few hours, and 5) you like to eat. Think about: nuts, dried fruit, Clif bars, pretzels, jerky, gogurt, baby carrots, apples, bananas, crackers, cheez-n-crackers, string cheese, hot pockets or frozen burritos to microwave, garden burgers and breakfast pastries to put in the toaster, yogurt drinks, etc. Restaurant Menus. Make lists of your favorite restaurants & menu items. When family/friends want to help, you can give specific information without much thought. They can either bring you the food or send gift cards for just the right meals. Freezer Meals.  Take some time to make a few meals to put in the freezer ahead of time.  Easy to freeze meals can be anything such as soup, lasagna, chicken pie, or spaghetti sauce. Set up a Meal Schedule.  Ask friends and family to sign up to bring you meals during the first few weeks of being home. (It can be passed around at baby showers!) You have no idea how helpful this will be until you are in the throes of parenting.  www.takethemameal.com is a great website to check out. Emotional Support Know who to call when you're stressed out. Parenting a newborn is very challenging work. There are times when it totally overwhelms your normal coping abilities. EVERY NEW PARENT NEEDS TO HAVE A PLAN FOR WHO TO CALL WHEN THEY JUST CAN'T COPE ANY MORE. (And it has to be someone other than the baby's other parent!) Before your baby is born, come up with at least one person you can call for support - write their phone number down and post it on the refrigerator. Anxiety & Sadness. Baby blues are normal after  pregnancy; however, there are more severe types of anxiety & sadness which can occur and should not be ignored.  They are always treatable, but you have to take the first step by reaching out for help. [facility] offers a "Mom Talk" group which meets every Tuesday from 10 am - 11 am.  This group is for new moms who need support and connection after their babies are born.  Call [phone].  Really, Really Helpful (Plan for them!   Make sure these happen often!!) Physical Support with Taking Care of Yourselves Asking friends and family. Before your baby is born, set up a schedule of people who can come and visit and help out (or ask a friend to schedule for you). Any time someone says "let me know what I can do to help," sign them up for a day. When they get there, their job is not to take care of the baby (that's your job and your joy). Their job is to take care of you!  Postpartum doulas. If you don't have anyone you can call on for support, look into postpartum doulas:  professionals at helping parents with caring for baby, caring for themselves, getting breastfeeding started, and helping with household tasks. www.padanc.org is a helpful website for learning about doulas in our area. Peer Support / Parent Groups Why: One of the greatest ideas for new parents is to be around other new parents. Parent groups give you a chance to share and listen to others who are going through the same season of life, get a sense of what is normal infant development by watching several babies learn and grow, share your stories of triumph and struggles with empathetic ears, and forgive your own mistakes when you realize all parents are learning by trial and error. Where to find: There are many places you can meet other new parents throughout our community.  [facility] offers the following classes for new moms and their little ones:  Baby and Me (Birth to Crawling) and Breastfeeding Support Group. Go to  www.conehealthybaby.com or call [phone] for more information. Time for your Relationship It's easy to get so caught up in meeting baby's immediate needs that it's hard to find time to connect with your partner, and meet the needs of your relationship. It's also easy to forget what "quality time with your partner" actually looks like. If you take your baby on a date, you'd be amazed how much of your couple time is spent feeding the baby, diapering the baby, admiring the baby, and talking about the baby. Dating: Try to take time for just the two of you. Babysitter tip: Sometimes when moms are breastfeeding a newborn, they find it hard to figure out how to schedule outings around baby's unpredictable feeding schedules. Have the babysitter come for a three hour period. When she comes over, if baby has just eaten, you can leave right away, and come back in two hours. If baby hasn't fed recently, you start the date at home. Once baby gets hungry and gets a good feeding in, you can head out for the rest of your date time. Date Nights at Home: If you can't get out, at least set aside one evening a week to prioritize your relationship: whenever baby dozes off or doesn't have any immediate needs, spend a little time focusing on each other. Potential conflicts: The main relationship conflicts that come up for new parents are: issues related to sexuality, financial stresses, a feeling of an unfair division of household tasks, and conflicts in parenting styles. The more you can work on these issues before baby arrives, the better!  Fun and Frills (Don't forget these. and don't feel guilty for indulging in them!) Everyone has something in life that is a fun little treat that they do just for themselves. It may be: reading the morning paper, or going for a daily jog, or having coffee with a friend once a week, or going to a movie on Friday nights,   or fine chocolates, or bubble baths, or curling up with a good  book. Unless you do fun things for yourself every now and then, it's hard to have the energy for fun with your baby. Whatever your "special" treats are, make sure you find a way to continue to indulge in them after your baby is born. These special moments can recharge you, and allow you to return to baby with a new joy   PERINATAL MOOD DISORDERS: MATERNAL MENTAL HEALTH FROM CONCEPTION THROUGH THE POSTPARTUM PERIOD   _________________________________________Emergency and Crisis Resources If you are an imminent risk to self or others, are experiencing intense personal distress, and/or have noticed significant changes in activities of daily living, call:  911 [facility]: [phone]  [address], [place], [place], [address] Mobile Crisis: 877-626-1772 National Suicide Hotline: 988 Or visit the following crisis centers: Local Emergency Departments [facility]: [address], [place]  [phone]. Hours: 8:30AM-5PM. Insurance Accepted: Medicaid, Medicare, and Uninsured.  [facility]:  [address], [place]  Mon-Friday 8am-3pm, [phone]                                                                                  ___________ Non-Crisis Resources To identify specific providers that are covered by your insurance, contact your insurance company or local agencies:  [facility]: 1-800-256-2452 [facility]: 888-581-9988 [facility] [facility]: 1-800-939-5911 Postpartum Support International- Warm-line: 1-800-944-4773                                                      __Outpatient Therapy and Medication Management   Providers:  [facility]: [phone] Hours: 9AM-5PM  Insurance Accepted: AARP, Aetna, BCBS, Cigna, [employer], Humana, Medicare  [facility] [phone] Hours: 8AM-5:30PM  nsurance Accepted: All insurances EXCEPT AARP, Aetna,  [employer], and Humana [facility]: [phone] Hours: 8AM-8PM Insurance Accepted: Aetna, BCBS, Cigna, [employer], Medicaid, Medicare, Uninsured [facility]: [phone] [facility]: [phone] Hours: 8:30AM-7PM Insurance Accepted: Aetna, BCBS, Medicaid, Medicare, Tricare, United Healthcare [facility]:  [phone]   Hours:9AM-5PM Insurance Accepted:  Aetna, BCBS, [facility], Medicaid, United Health Care  [facility]: [phone] Hours: 9AM-5:30PM Insurance Accepted: AARP, Aetna, BCBS, Cigna, and Medicaid, Medicare, United Health Care [facility]:  [phone] Hours: 9am-5pm Insurance Accepted: BCBS; they do not accept Medicaid/Medicare [facility]: [phone] Hours: 9am-9pm Insurance Accepted: All major insurance including Medicaid and Medicare [facility]: [phone] Hours: 9AM- 5PM Insurance Accepted: All insurances EXCEPT Medicaid and Medicare. [facility]: [phone]   ____________                                                                       Parenting Support Groups [facility]: [phone] [facility]:  [phone] Family Support Network: (support for children in the NICU and/or with special needs), [phone]   ___________                                                                 Mental Health Support Groups Mental Health Association: [phone]    _____________                                                                                  Online Resources Postpartum Support International: http://www.postpartum.net/  800-944-4PPD 2Moms Supporting Moms:  www.momssupportingmoms.net    

## 2023-03-08 ENCOUNTER — Telehealth (HOSPITAL_COMMUNITY): Payer: Self-pay

## 2023-03-08 NOTE — Telephone Encounter (Signed)
Lvm to confirm 03/12/23 appt advised to call back by 12:00 by tomorrow

## 2023-03-09 ENCOUNTER — Encounter: Payer: Self-pay | Admitting: *Deleted

## 2023-03-09 NOTE — Telephone Encounter (Signed)
Appt confirmed 03/09/23 jj

## 2023-03-12 ENCOUNTER — Ambulatory Visit (INDEPENDENT_AMBULATORY_CARE_PROVIDER_SITE_OTHER): Payer: Medicaid Other | Admitting: Psychiatry

## 2023-03-12 ENCOUNTER — Encounter: Payer: Self-pay | Admitting: Family Medicine

## 2023-03-12 ENCOUNTER — Encounter (HOSPITAL_COMMUNITY): Payer: Self-pay | Admitting: Psychiatry

## 2023-03-12 DIAGNOSIS — F331 Major depressive disorder, recurrent, moderate: Secondary | ICD-10-CM | POA: Diagnosis not present

## 2023-03-12 DIAGNOSIS — F4310 Post-traumatic stress disorder, unspecified: Secondary | ICD-10-CM | POA: Insufficient documentation

## 2023-03-12 DIAGNOSIS — Z3A27 27 weeks gestation of pregnancy: Secondary | ICD-10-CM

## 2023-03-12 DIAGNOSIS — F1290 Cannabis use, unspecified, uncomplicated: Secondary | ICD-10-CM | POA: Insufficient documentation

## 2023-03-12 DIAGNOSIS — F411 Generalized anxiety disorder: Secondary | ICD-10-CM | POA: Insufficient documentation

## 2023-03-12 DIAGNOSIS — F5104 Psychophysiologic insomnia: Secondary | ICD-10-CM | POA: Diagnosis not present

## 2023-03-12 DIAGNOSIS — Z3490 Encounter for supervision of normal pregnancy, unspecified, unspecified trimester: Secondary | ICD-10-CM | POA: Insufficient documentation
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
## 2023-03-12 NOTE — Patient Instructions (Addendum)
As we discussed, that helpful website is SimpleSpeech.is what you would do is navigate to the exposures and go to the fact sheets page to find information on the different medications.  The main one that we talked about was Prozac (fluoxetine) and its fact sheet can be found here: http://white.info/  Another one that would help with nausea and low appetite along with sleep and anxiety/depression would be Remeron (mirtazapine): ArchiveMy.cz  I will try to coordinate with your OB doctor about checking a vitamin D, B12, folate, iron panel and see if they can place a nutrition referral for you.  If you want to start a medication in the future please give our office a call at [phone].

## 2023-03-13 MED ORDER — OMEPRAZOLE 20 MG PO CPDR: 20.0000 mg | DELAYED_RELEASE_CAPSULE | Freq: Every day | ORAL | 6 refills | Status: DC

## 2023-03-16 ENCOUNTER — Ambulatory Visit: Payer: Medicaid Other | Admitting: Obstetrics & Gynecology

## 2023-03-16 ENCOUNTER — Other Ambulatory Visit: Payer: Medicaid Other

## 2023-03-16 ENCOUNTER — Ambulatory Visit: Payer: Medicaid Other

## 2023-03-16 VITALS — BP 132/86 | HR 87 | Wt 190.6 lb

## 2023-03-16 DIAGNOSIS — Z3A27 27 weeks gestation of pregnancy: Secondary | ICD-10-CM | POA: Diagnosis not present

## 2023-03-16 DIAGNOSIS — Z131 Encounter for screening for diabetes mellitus: Secondary | ICD-10-CM

## 2023-03-16 DIAGNOSIS — O10912 Unspecified pre-existing hypertension complicating pregnancy, second trimester: Secondary | ICD-10-CM | POA: Diagnosis not present

## 2023-03-16 DIAGNOSIS — O0992 Supervision of high risk pregnancy, unspecified, second trimester: Secondary | ICD-10-CM | POA: Diagnosis not present

## 2023-03-16 DIAGNOSIS — O34219 Maternal care for unspecified type scar from previous cesarean delivery: Secondary | ICD-10-CM

## 2023-03-16 DIAGNOSIS — Z3A26 26 weeks gestation of pregnancy: Secondary | ICD-10-CM

## 2023-03-16 DIAGNOSIS — O10919 Unspecified pre-existing hypertension complicating pregnancy, unspecified trimester: Secondary | ICD-10-CM

## 2023-03-16 NOTE — Progress Notes (Signed)
HIGH-RISK PREGNANCY VISIT Patient name: Donna Conrad MRN [id number]  Date of birth: 11/30/1995 Chief Complaint:   Routine Prenatal Visit  History of Present Illness:   Donna Conrad is a 27 y.o. G10P0101 female at [redacted]w[redacted]d with an Estimated Date of Delivery: 06/11/23 being seen today for ongoing management of a high-risk pregnancy complicated by:  -Chronic HTN Procardia XL 60mg  daily -Prior C-section -HSV  Today she reports no complaints.   Contractions: Not present. Vag. Bleeding: None.  Movement: Present. denies leaking of fluid.      03/12/2023   10:41 AM 02/19/2023    9:26 AM 02/01/2023    8:22 AM 12/01/2022    9:09 AM  Depression screen PHQ 2/9  Decreased Interest  0 1 2  Down, Depressed, Hopeless  1 2 2   PHQ - 2 Score  1 3 4   Altered sleeping  3 0 3  Tired, decreased energy  3 3 3   Change in appetite  2 2 3   Feeling bad or failure about yourself   2 3 3   Trouble concentrating  2 3 3   Moving slowly or fidgety/restless  0 0 2  Suicidal thoughts  0 0 1  PHQ-9 Score  13 14 22   Difficult doing work/chores         Information is confidential and restricted. Go to Review Flowsheets to unlock data.     Current Outpatient Medications  Medication Instructions   aspirin EC 162 mg, Oral, Daily, Take after 12 weeks for prevention of preeclampsia later in pregnancy   Blood Pressure Monitor MISC For regular home bp monitoring during pregnancy   NIFEdipine (PROCARDIA XL/NIFEDICAL XL) 60 mg, Oral, Daily   omeprazole (PRILOSEC) 20 mg, Oral, Daily, 1 tablet a day   Prenatal Vit-Fe Fumarate-FA (PRENATAL VITAMIN PO) Oral   SODIUM FLUORIDE 5000 PPM 1.1 % PSTE Oral   valACYclovir (VALTREX) 500 mg, Oral, 2 times daily     Review of Systems:   Pertinent items are noted in HPI Denies abnormal vaginal discharge w/ itching/odor/irritation, headaches, visual changes, shortness of breath, chest pain, abdominal pain, severe nausea/vomiting, or problems with urination or bowel movements  unless otherwise stated above. Pertinent History Reviewed:  Reviewed past medical,surgical, social, obstetrical and family history.  Reviewed problem list, medications and allergies. Physical Assessment:   Vitals:   03/16/23 0943  BP: 132/86  Pulse: 87  Weight: 190 lb 9.6 oz (86.5 kg)  Body mass index is 33.76 kg/m.           Physical Examination:   General appearance: alert, well appearing, and in no distress  Mental status: normal mood, behavior, speech, dress, motor activity, and thought processes  Skin: warm & dry   Extremities:   no edema, no calf tenderness bilaterally   Cardiovascular: normal heart rate noted  Respiratory: normal respiratory effort, no distress  Abdomen: gravid, soft, non-tender  Pelvic: Cervical exam deferred         Fetal Status:     Movement: Present    Fetal Surveillance Testing today: ,cephalic,posterior placenta gr 3,cx 2.9 cm,AFI 14 cm,FHR 157 bpm,EFW 1104 g 40%,limited head measurement because of fetal position    Chaperone: N/A    No results found for this or any previous visit (from the past 24 hour(s)).   Assessment & Plan:  High-risk pregnancy: G2P0101 at [redacted]w[redacted]d with an Estimated Date of Delivery: 06/11/23   1) Chronic HTN -doing well with current meds, asymptomatic -Growth scan today AGA, continue every 4wks -  antepartum testing to start @ 32wks  2) Prior C-section []  next visit review MOD  3) HSV2  Meds: No orders of the defined types were placed in this encounter.   Labs/procedures today: growth scan, PN2 today  Treatment Plan:  routine OB care and as outlined above  Reviewed: Preterm labor symptoms and general obstetric precautions including but not limited to vaginal bleeding, contractions, leaking of fluid and fetal movement were reviewed in detail with the patient.  All questions were answered. Pt has home bp cuff. Check bp weekly, let us know if >140/90.   Follow-up: Return in about 3 weeks (around 04/06/2023) for HROB  visit.   Future Appointments  Date Time Provider Department Center  04/02/2023  9:15 AM [facility] HEALTH CLINICIAN [facility]  04/13/2023  8:30 AM [facility] [place] [facility] None  04/17/2023  9:30 AM [facility] NURSE [facility]  04/24/2023  9:30 AM [facility] NURSE [facility]  04/27/2023  8:30 AM [facility] [place] [facility] None  05/01/2023  9:30 AM [facility] NURSE [facility]  05/08/2023  9:30 AM [facility] NURSE [facility]  05/11/2023  8:30 AM [facility] [place] [facility] None  05/15/2023  9:30 AM [facility] NURSE [facility]  05/18/2023 10:00 AM [facility] [place] [facility] None  05/22/2023  9:30 AM [facility] NURSE [facility]  05/25/2023 10:00 AM [facility] [place] [facility] None  06/01/2023 10:00 AM [facility] [place] [facility] None  06/05/2023  9:30 AM [facility]FT[facility]N NURSE [facility]  06/08/2023  8:30 AM [facility] [place] [facility] None  06/12/2023  9:30 AM [facility]FT[facility] NURSE [facility]TO[facility]YN    No orders of the defined types were placed in this encounter.   Myna Hidalgo, DO Attending Obstetrician & Gynecologist, [facility] for [employer], [facility]

## 2023-03-16 NOTE — Progress Notes (Signed)
[place] 27+4 wks,cephalic,posterior placenta gr 3,cx 2.9 cm,AFI 14 cm,FHR 157 bpm,EFW 1104 g 40%,limited head measurement because of fetal position

## 2023-03-17 LAB — ANTIBODY SCREEN: Antibody Screen: NEGATIVE

## 2023-03-17 LAB — CBC
Hematocrit: 32.5 % — ABNORMAL LOW (ref 34.0–46.6)
Hemoglobin: 10 g/dL — ABNORMAL LOW (ref 11.1–15.9)
MCH: 28.6 pg (ref 26.6–33.0)
MCHC: 30.8 g/dL — ABNORMAL LOW (ref 31.5–35.7)
MCV: 93 fL (ref 79–97)
Platelets: 334 10*3/uL (ref 150–450)
RBC: 3.5 x10E6/uL — ABNORMAL LOW (ref 3.77–5.28)
RDW: 13 % (ref 11.7–15.4)
WBC: 8.2 10*3/uL (ref 3.4–10.8)

## 2023-03-17 LAB — HIV ANTIBODY (ROUTINE TESTING W REFLEX): HIV Screen 4th Generation wRfx: NONREACTIVE

## 2023-03-17 LAB — GLUCOSE TOLERANCE, 2 HOURS W/ 1HR
Glucose, 1 hour: 132 mg/dL (ref 70–179)
Glucose, 2 hour: 118 mg/dL (ref 70–152)
Glucose, Fasting: 79 mg/dL (ref 70–91)

## 2023-03-17 LAB — RPR: RPR Ser Ql: NONREACTIVE

## 2023-03-19 ENCOUNTER — Other Ambulatory Visit: Payer: Self-pay | Admitting: Obstetrics & Gynecology

## 2023-03-19 MED ORDER — FERROUS GLUCONATE 324 (38 FE) MG PO TABS: 324.0000 mg | ORAL_TABLET | ORAL | 3 refills | Status: DC

## 2023-03-19 NOTE — BH Specialist Note (Deleted)
Integrated Behavioral Health via Telemedicine Visit  03/19/2023 [name] [id number]  Number of Integrated Behavioral Health Clinician visits: 3- Third Visit  Session Start time: 0917   Session End time: 0937  Total time in minutes: 20   Referring Provider: *** Patient/Family location: Home*** [facility] Provider location: [facility]  All persons participating in visit: [name] and Donna Conrad ***  Types of Service: {CHL AMB TYPE OF SERVICE:[phone]}  I connected with Donna Conrad and/or Donna Conrad's {family members:20773} via  Telephone or Engineer, civil (consulting)  (Video is Surveyor, mining) and verified that I am speaking with the correct person using two identifiers. Discussed confidentiality: Yes   I discussed the limitations of telemedicine and the availability of in person appointments.  Discussed there is a possibility of technology failure and discussed alternative modes of communication if that failure occurs.  I discussed that engaging in this telemedicine visit, they consent to the provision of behavioral healthcare and the services will be billed under their insurance.  Patient and/or legal guardian expressed understanding and consented to Telemedicine visit: Yes   Presenting Concerns: Patient and/or family reports the following symptoms/concerns: *** Duration of problem: ***; Severity of problem: {Mild/Moderate/Severe:20260}  Patient and/or Family's Strengths/Protective Factors: {CHL AMB BH PROTECTIVE [phone]}  Goals Addressed: Patient will:  Reduce symptoms of: {IBH Symptoms:21014056}   Increase knowledge and/or ability of: {IBH Patient Tools:21014057}   Demonstrate ability to: {IBH Goals:21014053}  Progress towards Goals: {CHL AMB BH PROGRESS TOWARDS GOALS:[phone]}  Interventions: Interventions utilized:  {IBH  Interventions:21014054} Standardized Assessments completed: {IBH Screening Tools:21014051}  Patient and/or Family Response: Patient agrees with treatment plan.   Assessment: Patient currently experiencing ***.   Patient may benefit from continued therapeutic intervention *** .  Plan: Follow up with behavioral health clinician on : *** Behavioral recommendations:  -*** -*** Referral(s): {IBH Referrals:21014055}  I discussed the assessment and treatment plan with the patient and/or parent/guardian. They were provided an opportunity to ask questions and all were answered. They agreed with the plan and demonstrated an understanding of the instructions.   They were advised to call back or seek an in-person evaluation if the symptoms worsen or if the condition fails to improve as anticipated.  Donna Lips, LCSW     03/12/2023   10:41 AM 02/19/2023    9:26 AM 02/01/2023    8:22 AM 12/01/2022    9:09 AM  Depression screen PHQ 2/9  Decreased Interest  0 1 2  Down, Depressed, Hopeless  1 2 2   PHQ - 2 Score  1 3 4   Altered sleeping  3 0 3  Tired, decreased energy  3 3 3   Change in appetite  2 2 3   Feeling bad or failure about yourself   2 3 3   Trouble concentrating  2 3 3   Moving slowly or fidgety/restless  0 0 2  Suicidal thoughts  0 0 1  PHQ-9 Score  13 14 22   Difficult doing work/chores         Information is confidential and restricted. Go to Review Flowsheets to unlock data.      02/19/2023    9:28 AM 02/01/2023    8:25 AM 12/01/2022    9:10 AM  GAD 7 : Generalized Anxiety Score  Nervous, Anxious, on Edge 3 3 3   Control/stop worrying 2 3 3   Worry too much - different things 2 3 3   Trouble relaxing 2  3 3  Restless 1 0 0  Easily annoyed or irritable 3 3 3   Afraid - awful might happen 3 3 3   Total GAD 7 Score 16 18 18    /

## 2023-03-19 NOTE — Progress Notes (Signed)
Called patient with results of lab work -Prescription of oral iron sent in -If patient is unable to get prescription, please notify us at next visit so we can discuss OTC supplements  Myna Hidalgo, DO Attending Obstetrician & Gynecologist, Faculty Practice Center for [employer], [facility]

## 2023-03-28 ENCOUNTER — Inpatient Hospital Stay (HOSPITAL_COMMUNITY)
Admission: AD | Admit: 2023-03-28 | Discharge: 2023-03-28 | Disposition: A | Discharge status: Home / Self Care | Payer: Medicaid Other | Attending: Obstetrics and Gynecology | Admitting: Obstetrics and Gynecology

## 2023-03-28 ENCOUNTER — Other Ambulatory Visit: Payer: Self-pay

## 2023-03-28 ENCOUNTER — Encounter (HOSPITAL_COMMUNITY): Payer: Self-pay | Admitting: Obstetrics and Gynecology

## 2023-03-28 DIAGNOSIS — R519 Headache, unspecified: Secondary | ICD-10-CM | POA: Diagnosis not present

## 2023-03-28 DIAGNOSIS — Z98891 History of uterine scar from previous surgery: Secondary | ICD-10-CM | POA: Insufficient documentation

## 2023-03-28 DIAGNOSIS — Z3A29 29 weeks gestation of pregnancy: Secondary | ICD-10-CM

## 2023-03-28 DIAGNOSIS — D649 Anemia, unspecified: Secondary | ICD-10-CM

## 2023-03-28 DIAGNOSIS — Z91199 Patient's noncompliance with other medical treatment and regimen due to unspecified reason: Secondary | ICD-10-CM | POA: Insufficient documentation

## 2023-03-28 DIAGNOSIS — O10919 Unspecified pre-existing hypertension complicating pregnancy, unspecified trimester: Secondary | ICD-10-CM

## 2023-03-28 DIAGNOSIS — G44209 Tension-type headache, unspecified, not intractable: Secondary | ICD-10-CM

## 2023-03-28 DIAGNOSIS — O10913 Unspecified pre-existing hypertension complicating pregnancy, third trimester: Secondary | ICD-10-CM

## 2023-03-28 DIAGNOSIS — O34219 Maternal care for unspecified type scar from previous cesarean delivery: Secondary | ICD-10-CM

## 2023-03-28 DIAGNOSIS — O99013 Anemia complicating pregnancy, third trimester: Secondary | ICD-10-CM | POA: Insufficient documentation

## 2023-03-28 DIAGNOSIS — Z8759 Personal history of other complications of pregnancy, childbirth and the puerperium: Secondary | ICD-10-CM | POA: Diagnosis not present

## 2023-03-28 DIAGNOSIS — O99012 Anemia complicating pregnancy, second trimester: Secondary | ICD-10-CM | POA: Insufficient documentation

## 2023-03-28 DIAGNOSIS — O10912 Unspecified pre-existing hypertension complicating pregnancy, second trimester: Secondary | ICD-10-CM | POA: Diagnosis present

## 2023-03-28 DIAGNOSIS — Z862 Personal history of diseases of the blood and blood-forming organs and certain disorders involving the immune mechanism: Secondary | ICD-10-CM | POA: Insufficient documentation

## 2023-03-28 DIAGNOSIS — O0992 Supervision of high risk pregnancy, unspecified, second trimester: Secondary | ICD-10-CM | POA: Insufficient documentation

## 2023-03-28 LAB — CBC
HCT: 23.6 % — ABNORMAL LOW (ref 36.0–46.0)
Hemoglobin: 7.8 g/dL — ABNORMAL LOW (ref 12.0–15.0)
MCH: 28.2 pg (ref 26.0–34.0)
MCHC: 33.1 g/dL (ref 30.0–36.0)
MCV: 85.2 fL (ref 80.0–100.0)
Platelets: 266 10*3/uL (ref 150–400)
RBC: 2.77 MIL/uL — ABNORMAL LOW (ref 3.87–5.11)
RDW: 12.9 % (ref 11.5–15.5)
WBC: 8.5 10*3/uL (ref 4.0–10.5)
nRBC: 0 % (ref 0.0–0.2)

## 2023-03-28 LAB — RESPIRATORY PANEL BY PCR

## 2023-03-28 LAB — COMPREHENSIVE METABOLIC PANEL
ALT: 17 U/L (ref 0–44)
AST: 21 U/L (ref 15–41)
Albumin: 2.6 g/dL — ABNORMAL LOW (ref 3.5–5.0)
Alkaline Phosphatase: 71 U/L (ref 38–126)
Anion gap: 8 (ref 5–15)
BUN: 6 mg/dL (ref 6–20)
CO2: 21 mmol/L — ABNORMAL LOW (ref 22–32)
Calcium: 8.8 mg/dL — ABNORMAL LOW (ref 8.9–10.3)
Chloride: 105 mmol/L (ref 98–111)
Creatinine, Ser: 0.45 mg/dL (ref 0.44–1.00)
GFR, Estimated: >60 mL/min (ref >60)
Glucose, Bld: 84 mg/dL (ref 70–99)
Potassium: 3.3 mmol/L — ABNORMAL LOW (ref 3.5–5.1)
Sodium: 134 mmol/L — ABNORMAL LOW (ref 135–145)
Total Bilirubin: 0.3 mg/dL (ref 0.3–1.2)
Total Protein: 6.2 g/dL — ABNORMAL LOW (ref 6.5–8.1)

## 2023-03-28 LAB — URINALYSIS, ROUTINE W REFLEX MICROSCOPIC
Bilirubin Urine: NEGATIVE
Glucose, UA: NEGATIVE mg/dL
Hgb urine dipstick: NEGATIVE
Ketones, ur: NEGATIVE mg/dL
Leukocytes,Ua: NEGATIVE
Nitrite: NEGATIVE
Protein, ur: NEGATIVE mg/dL
Specific Gravity, Urine: 1.016 (ref 1.005–1.030)
pH: 7 (ref 5.0–8.0)

## 2023-03-28 LAB — LACTATE DEHYDROGENASE: LDH: 154 U/L (ref 98–192)

## 2023-03-28 LAB — PROTEIN / CREATININE RATIO, URINE
Creatinine, Urine: 131 mg/dL
Protein Creatinine Ratio: 0.09 mg/mg{creat} (ref 0.00–0.15)
Total Protein, Urine: 12 mg/dL

## 2023-03-28 MED ORDER — LABETALOL HCL 5 MG/ML IV SOLN: 40.0000 mg | INTRAVENOUS | Status: DC | PRN

## 2023-03-28 MED ORDER — CYCLOBENZAPRINE HCL 5 MG PO TABS: 5.0000 mg | ORAL_TABLET | Freq: Three times a day (TID) | ORAL | 0 refills | Status: DC | PRN

## 2023-03-28 MED ORDER — CYCLOBENZAPRINE HCL 5 MG PO TABS
5.0000 mg | ORAL_TABLET | Freq: Three times a day (TID) | ORAL | Status: DC | PRN
  Administered 2023-03-28: 5 mg via ORAL
  Filled 2023-03-28: qty 1

## 2023-03-28 MED ORDER — FENTANYL CITRATE (PF) 100 MCG/2ML IJ SOLN: 50.0000 ug | INTRAMUSCULAR | Status: DC | PRN

## 2023-03-28 MED ORDER — ACETAMINOPHEN 500 MG PO TABS
1000.0000 mg | ORAL_TABLET | Freq: Once | ORAL | Status: AC
  Administered 2023-03-28: 1000 mg via ORAL
  Filled 2023-03-28: qty 2

## 2023-03-28 MED ORDER — LABETALOL HCL 5 MG/ML IV SOLN: 80.0000 mg | INTRAVENOUS | Status: DC | PRN

## 2023-03-28 MED ORDER — LABETALOL HCL 5 MG/ML IV SOLN: 20.0000 mg | INTRAVENOUS | Status: DC | PRN

## 2023-03-28 MED ORDER — HYDRALAZINE HCL 20 MG/ML IJ SOLN: 10.0000 mg | INTRAMUSCULAR | Status: DC | PRN

## 2023-03-28 MED ORDER — FENTANYL CITRATE (PF) 100 MCG/2ML IJ SOLN
50.0000 ug | INTRAMUSCULAR | Status: DC | PRN
Start: 2023-03-28 — End: 2023-03-28

## 2023-03-28 NOTE — MAU Note (Addendum)
..  Donna Conrad is a 27 y.o. at [redacted]w[redacted]d here in MAU reporting: abdominal cramps that started two days ago. Rating cramps at 6/10 pain. Headaches starting a week ago that doesn't get better with tylenol rated at 10/10 pain that "feels similar to preeclampsia headaches from previous pregnancy". Has noticed some increased swelling over the last week. Denies vision changes, or RUQ pain. Feeling baby move slightly less than normal. Sometimes feels "wetness in vagina that doesn't feel like pee." Had one occurrence of vomiting, diarrhea this morning. Denies vaginal bleeding.   Pain score: 10/10 headache. 6/10 cramps Vitals:   03/28/23 1420  BP: 123/75  Pulse: 84  Resp: 18  Temp: 98.9 F (37.2 C)  SpO2: 100%     FHT:145  Lab orders placed from triage:  UA

## 2023-03-28 NOTE — MAU Provider Note (Signed)
Chief Complaint:  Abdominal Pain and Headache   Event Date/Time   First Provider Initiated Contact with Patient 03/28/23 1438     HPI  HPI: Donna Conrad is a 27 y.o. G2P0101 at 53w2dwho presents to maternity admissions reporting abdominal cramps that started two days ago. Rating cramps at 6/10 pain, intermittent, not since arriving in MAU.   Headaches starting a week ago that comes and goes without associated symptoms that doesn't get better with tylenol 500mg  rated at 10/10 pain that "feels similar to preeclampsia headaches from previous pregnancy".   Has noticed some increased swelling over the last week in her face.   Denies vision changes, or RUQ pain. Feeling baby move slightly less than normal. Sometimes feels "wetness in vagina that doesn't feel like pee."   Had one occurrence of non-bloody non-bilious vomiting, non-bloody non-mucoid diarrhea this morning.   She reports good fetal movement, denies LOF, vaginal bleeding, vaginal itching/burning, urinary symptoms, h/a, dizziness, n/v, diarrhea, constipation or fever/chills.  She denies headache, visual changes or RUQ abdominal pain.   High-risk pregnancy complicated by:  - Chronic HTN      Procardia XL 60mg  daily - patient states she is not taking - Prior C-section - Hx of HELLP at 30wks - HSV   Past Medical History: Past Medical History:  Diagnosis Date   HSV infection    Pre-eclampsia     Past obstetric history: OB History  Gravida Para Term Preterm AB Living  2 1   1   1   SAB IAB Ectopic Multiple Live Births        0 1    # Outcome Date GA Lbr Len/2nd Weight Sex Type Anes PTL Lv  2 Current           1 Preterm 11/19/14 [redacted]w[redacted]d  1520 g F CS-LTranv Spinal  LIV     Complications: Severe pre-eclampsia, HELLP syndrome    Past Surgical History: Past Surgical History:  Procedure Laterality Date   CESAREAN SECTION N/A 11/19/2014   Procedure: CESAREAN SECTION;  Surgeon: Sherian Rein, MD;  Location: [facility];   Service: Obstetrics;  Laterality: N/A;   WISDOM TOOTH EXTRACTION      Family History: Family History  Problem Relation Age of Onset   Asthma Mother    Diabetes Mother    Hypertension Mother    Miscarriages / [place] Mother    Stroke Mother    Bipolar disorder Mother    Asthma Father    Depression Father    Stroke Father    Diabetes Sister    Mental illness Sister    Miscarriages / Stillbirths Sister    Bipolar disorder Sister    Diabetes Brother    Asthma Daughter    Diabetes Maternal Aunt     Social History: Social History   Tobacco Use   Smoking status: Former    Types: Cigarettes   Smokeless tobacco: Never   Tobacco comments:    Pt reports smoking only some days, but has stopped since finding out she was pregnant.   Vaping Use   Vaping status: Never Used  Substance Use Topics   Alcohol use: Not Currently    Comment: Last drink in February 2024.  1 person a bottle of gray goose vodka at a time   Drug use: Yes    Types: Marijuana    Comment: Couple hits of a joint last use in early October 2024    Allergies: No Known Allergies  Meds:  Medications Prior  to Admission  Medication Sig Dispense Refill Last Dose   aspirin EC 81 MG tablet Take 2 tablets (162 mg total) by mouth daily. Take after 12 weeks for prevention of preeclampsia later in pregnancy 300 tablet 2 03/27/2023 at 2300   omeprazole (PRILOSEC) 20 MG capsule Take 1 capsule (20 mg total) by mouth daily. 1 tablet a day 30 capsule 6 03/27/2023 at 2300   Prenatal Vit-Fe Fumarate-FA (PRENATAL VITAMIN PO) Take by mouth.   03/28/2023 at 0800   valACYclovir (VALTREX) 500 MG tablet Take 1 tablet (500 mg total) by mouth 2 (two) times daily. 60 tablet 6 03/27/2023 at 2300   Blood Pressure Monitor MISC For regular home bp monitoring during pregnancy 1 each 0    ferrous gluconate (FERGON) 324 MG tablet Take 1 tablet (324 mg total) by mouth every other day. 60 tablet 3    NIFEdipine (PROCARDIA XL/NIFEDICAL XL) 60  MG 24 hr tablet Take 1 tablet (60 mg total) by mouth daily. (Patient not taking: Reported on 02/07/2023) 30 tablet 6    SODIUM FLUORIDE 5000 PPM 1.1 % PSTE Take by mouth.       I have reviewed patient's Past Medical Hx, Surgical Hx, Family Hx, Social Hx, medications and allergies.   ROS:  Review of Systems Other systems negative  Physical Exam  Patient Vitals for the past 24 hrs:  BP Temp Temp src Pulse Resp SpO2 Height Weight  03/28/23 1438 121/70 -- -- 79 18 -- -- --  03/28/23 1434 -- -- -- -- -- 100 % -- --  03/28/23 1420 123/75 98.9 F (37.2 C) Oral 84 18 100 % 5\' 3"  (1.6 m) 90.8 kg   Constitutional: Well-developed, well-nourished female in no acute distress.  Cardiovascular: normal rate and rhythm Respiratory: normal effort, clear to auscultation bilaterally GI: Abd soft, non-tender, gravid appropriate for gestational age.   No rebound or guarding. MS: Extremities nontender, no edema, normal ROM Neurologic: Alert and oriented x 4.  GU: Neg CVAT.   FHT:  Baseline 145, moderate variability, accelerations present, no decelerations Contractions: None monitorable   Labs: Results for orders placed or performed during the hospital encounter of 03/28/23 (from the past 24 hour(s))  Protein / creatinine ratio, urine     Status: None   Collection Time: 03/28/23  2:43 PM  Result Value Ref Range   Creatinine, Urine 131 mg/dL   Total Protein, Urine 12 mg/dL   Protein Creatinine Ratio 0.09 0.00 - 0.15 mg/mg[Cre]  Urinalysis, Routine w reflex microscopic -Urine, Clean Catch     Status: None   Collection Time: 03/28/23  2:44 PM  Result Value Ref Range   Color, Urine YELLOW YELLOW   APPearance CLEAR CLEAR   Specific Gravity, Urine 1.016 1.005 - 1.030   pH 7.0 5.0 - 8.0   Glucose, UA NEGATIVE NEGATIVE mg/dL   Hgb urine dipstick NEGATIVE NEGATIVE   Bilirubin Urine NEGATIVE NEGATIVE   Ketones, ur NEGATIVE NEGATIVE mg/dL   Protein, ur NEGATIVE NEGATIVE mg/dL   Nitrite NEGATIVE NEGATIVE    Leukocytes,Ua NEGATIVE NEGATIVE  Lactate dehydrogenase     Status: None   Collection Time: 03/28/23  2:47 PM  Result Value Ref Range   LDH 154 98 - 192 U/L  Comprehensive metabolic panel     Status: Abnormal   Collection Time: 03/28/23  2:49 PM  Result Value Ref Range   Sodium 134 (L) 135 - 145 mmol/L   Potassium 3.3 (L) 3.5 - 5.1 mmol/L   Chloride 105  98 - 111 mmol/L   CO2 21 (L) 22 - 32 mmol/L   Glucose, Bld 84 70 - 99 mg/dL   BUN 6 6 - 20 mg/dL   Creatinine, Ser 4.03 0.44 - 1.00 mg/dL   Calcium 8.8 (L) 8.9 - 10.3 mg/dL   Total Protein 6.2 (L) 6.5 - 8.1 g/dL   Albumin 2.6 (L) 3.5 - 5.0 g/dL   AST 21 15 - 41 U/L   ALT 17 0 - 44 U/L   Alkaline Phosphatase 71 38 - 126 U/L   Total Bilirubin 0.3 0.3 - 1.2 mg/dL   GFR, Estimated >47 >42 mL/min   Anion gap 8 5 - 15  CBC     Status: Abnormal   Collection Time: 03/28/23  2:49 PM  Result Value Ref Range   WBC 8.5 4.0 - 10.5 K/uL   RBC 2.77 (L) 3.87 - 5.11 MIL/uL   Hemoglobin 7.8 (L) 12.0 - 15.0 g/dL   HCT 59.5 (L) 63.8 - 75.6 %   MCV 85.2 80.0 - 100.0 fL   MCH 28.2 26.0 - 34.0 pg   MCHC 33.1 30.0 - 36.0 g/dL   RDW 43.3 29.5 - 18.8 %   Platelets 266 150 - 400 K/uL   nRBC 0.0 0.0 - 0.2 %  Respiratory (~20 pathogens) panel by PCR     Status: None   Collection Time: 03/28/23  3:11 PM   Specimen: Nasopharyngeal Swab; Respiratory  Result Value Ref Range   Adenovirus NOT DETECTED NOT DETECTED   Coronavirus 229E NOT DETECTED NOT DETECTED   Coronavirus HKU1 NOT DETECTED NOT DETECTED   Coronavirus NL63 NOT DETECTED NOT DETECTED   Coronavirus OC43 NOT DETECTED NOT DETECTED   Metapneumovirus NOT DETECTED NOT DETECTED   Rhinovirus / Enterovirus NOT DETECTED NOT DETECTED   Influenza A NOT DETECTED NOT DETECTED   Influenza B NOT DETECTED NOT DETECTED   Parainfluenza Virus 1 NOT DETECTED NOT DETECTED   Parainfluenza Virus 2 NOT DETECTED NOT DETECTED   Parainfluenza Virus 3 NOT DETECTED NOT DETECTED   Parainfluenza Virus 4 NOT DETECTED  NOT DETECTED   Respiratory Syncytial Virus NOT DETECTED NOT DETECTED   Bordetella pertussis NOT DETECTED NOT DETECTED   Bordetella Parapertussis NOT DETECTED NOT DETECTED   Chlamydophila pneumoniae NOT DETECTED NOT DETECTED   Mycoplasma pneumoniae NOT DETECTED NOT DETECTED   O/Positive/-- (07/29 1045)  Imaging:  US OB Follow Up  Result Date: 03/19/2023 Table formatting from the original result was not included.  ..an Financial trader of Ultrasound Medicine Technical sales engineer) accredited practice Center for [facility] [address] [place] [address] Ordering Provider: Myna Hidalgo, DO FOLLOW UP SONOGRAM ADESUWA STURM is in the office for a follow up sonogram for EFW. She is a 27 y.o. year old G66P0101 with Estimated Date of Delivery: 06/11/23 by LMP now at  [redacted]w[redacted]d weeks gestation. Thus far the pregnancy has been complicated by CHTN,prediabetes,hx of preeclampsia . GESTATION: SINGLETON PRESENTATION: cephalic FETAL ACTIVITY:          Heart rate         157          The fetus is active. AMNIOTIC FLUID: The amniotic fluid volume is  normal, 14 cm. PLACENTA LOCALIZATION:  posterior GRADE 3 CERVIX: Measures 2.9 cm ADNEXA: The ovaries are normal. GESTATIONAL AGE AND  BIOMETRICS: Gestational criteria: Estimated Date of Delivery: 06/11/23 by LMP now at [redacted]w[redacted]d Previous Scans:3          BIPARIETAL  DIAMETER           6.91 cm         27+6 weeks HEAD CIRCUMFERENCE           26.22 cm         28+4 weeks ABDOMINAL CIRCUMFERENCE           23 cm         27+3 weeks FEMUR LENGTH           5.20 cm         27+5 weeks                                                       AVERAGE EGA(BY THIS SCAN):  27+6 weeks                                                 ESTIMATED FETAL WEIGHT:       1104  grams, 40 % ANATOMICAL SURVEY                                                                            COMMENTS CEREBRAL VENTRICLES yes normal  CHOROID PLEXUS yes normal  CEREBELLUM yes normal  CISTERNA MAGNA  Yes   normal   CAVUM SEPTI PELLUCIDI YES NORMAL      ORBITS yes normal  NASAL BONE yes normal  NOSE/LIP yes normal  FACIAL PROFILE yes normal  4 CHAMBERED HEART yes normal  OUTFLOW TRACTS YES normaL  3VV YES NORMAL  3VTV YES NORMAL  SITUS YES NORMAL      DIAPHRAGM yes normal  STOMACH yes normal  RENAL REGION yes normal  BLADDER yes normal          3 VESSEL CORD yes normal              GENITALIA yes normal female     SUSPECTED ABNORMALITIES:  no QUALITY OF SCAN: limited head measurement because of fetal position TECHNICIAN COMMENTS: [place] 27+4 wks,cephalic,posterior placenta gr 3,cx 2.9 cm,AFI 14 cm,FHR 157 bpm,EFW 1104 g 40%,limited head measurement because of fetal position A copy of this report including all images has been saved and backed up to a second source for retrieval if needed. All measures and details of the anatomical scan, placentation, fluid volume and pelvic anatomy are contained in that report. Amber Flora Lipps 03/16/2023 9:37 AM Clinical Impression and recommendations: I have reviewed the sonogram results above, combined with the patient's current clinical course, below are my impressions and any appropriate recommendations for management based on the sonographic findings. 1.  [id number] Estimated Date of Delivery: 06/11/23 by serial sonographic evaluations 2.  Fetal sonographic surveillance findings: a). Normal fluid volume b). Normal growth percentile with appropriate interval growth:  40% 3.  Normal general sonographic findings Recommend continued prenatal evaluations and care based on this sonogram and as clinically indicated from the patient's clinical course. Victorino Dike  Charlotta Newton, DO Attending Obstetrician & Gynecologist, [facility] for [employer], [facility]     MAU Course/MDM: I have reviewed the triage vital signs and the nursing notes.   Pertinent labs & imaging results that were available during my care of the patient were reviewed by me and considered in my medical  decision making (see chart for details).      I have reviewed her medical records including past results, notes and treatments.   I have ordered labs and reviewed results.  NST reviewed  Treatments in MAU included CBC, CMP, Urine pro/cr, Tylenol, Flexiril.    Assessment: 1. Chronic hypertension affecting pregnancy   2. Supervision of high risk pregnancy in second trimester   3. History of cesarean delivery, currently pregnant   Noncompliant with Procardia, though well controlled BP in MAU today.  - preeclampsia labs wnl   Headaches: periorbital, intermittent, sudden w/o associated visual symptoms, nausea or emesis. Appears well hydrated. Sick contacts at home with GI illness.  - resolved after Tylenol and Flexiril  Anemia (incidental finding) Hb 7.8(down from 10.0 12 days ago); denies active bleeding or abdominal pain or trauma. Asymptomatic. RDW normal.  - normocytic, iron studies pending - follow up out patient; likely would benefit from IV iron  Plan: Discharge home Labor precautions and fetal kick counts Follow up in Office for prenatal visits and recheck  Follow-up Information     [facility] for [facility] at [facility] Follow up.   Specialty: Obstetrics and Gynecology Why: As scheduled for routine prenatal care Contact information: [address] [place] [address] [phone]               Pt stable at time of discharge.  Wyn Forster, MD FMOB Fellow, Faculty practice [facility], Center for [facility]  03/28/2023 6:36 PM

## 2023-04-02 ENCOUNTER — Telehealth: Payer: Self-pay | Admitting: *Deleted

## 2023-04-02 ENCOUNTER — Ambulatory Visit (INDEPENDENT_AMBULATORY_CARE_PROVIDER_SITE_OTHER): Payer: Medicaid Other | Admitting: Advanced Practice Midwife

## 2023-04-02 ENCOUNTER — Other Ambulatory Visit: Payer: Self-pay

## 2023-04-02 ENCOUNTER — Telehealth: Payer: Self-pay | Admitting: Advanced Practice Midwife

## 2023-04-02 ENCOUNTER — Encounter: Payer: Self-pay | Admitting: Advanced Practice Midwife

## 2023-04-02 ENCOUNTER — Telehealth: Payer: Self-pay

## 2023-04-02 VITALS — BP 133/92 | HR 82 | Wt 198.0 lb

## 2023-04-02 DIAGNOSIS — O10919 Unspecified pre-existing hypertension complicating pregnancy, unspecified trimester: Secondary | ICD-10-CM

## 2023-04-02 DIAGNOSIS — B009 Herpesviral infection, unspecified: Secondary | ICD-10-CM

## 2023-04-02 DIAGNOSIS — O34219 Maternal care for unspecified type scar from previous cesarean delivery: Secondary | ICD-10-CM

## 2023-04-02 DIAGNOSIS — Z3A30 30 weeks gestation of pregnancy: Secondary | ICD-10-CM

## 2023-04-02 DIAGNOSIS — F331 Major depressive disorder, recurrent, moderate: Secondary | ICD-10-CM

## 2023-04-02 DIAGNOSIS — O99013 Anemia complicating pregnancy, third trimester: Secondary | ICD-10-CM

## 2023-04-02 DIAGNOSIS — O0993 Supervision of high risk pregnancy, unspecified, third trimester: Secondary | ICD-10-CM

## 2023-04-02 DIAGNOSIS — O10913 Unspecified pre-existing hypertension complicating pregnancy, third trimester: Secondary | ICD-10-CM

## 2023-04-02 DIAGNOSIS — F5089 Other specified eating disorder: Secondary | ICD-10-CM | POA: Insufficient documentation

## 2023-04-02 DIAGNOSIS — Z1389 Encounter for screening for other disorder: Secondary | ICD-10-CM

## 2023-04-02 DIAGNOSIS — Z331 Pregnant state, incidental: Secondary | ICD-10-CM

## 2023-04-02 LAB — POCT URINALYSIS DIPSTICK OB
Blood, UA: NEGATIVE
Glucose, UA: NEGATIVE
Ketones, UA: NEGATIVE
Leukocytes, UA: NEGATIVE
Nitrite, UA: NEGATIVE
POC,PROTEIN,UA: NEGATIVE

## 2023-04-02 LAB — POCT HEMOGLOBIN: Hemoglobin: 6.9 g/dL — AB (ref 11–14.6)

## 2023-04-02 MED ORDER — ALBUTEROL SULFATE (2.5 MG/3ML) 0.083% IN NEBU: 2.5000 mg | INHALATION_SOLUTION | Freq: Once | RESPIRATORY_TRACT | Status: AC | PRN

## 2023-04-02 MED ORDER — SODIUM CHLORIDE 0.9 % IV SOLN: INTRAVENOUS | Status: DC | PRN

## 2023-04-02 MED ORDER — SODIUM CHLORIDE 0.9 % IV BOLUS: 500.0000 mL | Freq: Once | INTRAVENOUS | Status: AC | PRN

## 2023-04-02 MED ORDER — SODIUM CHLORIDE 0.9 % IV SOLN: 500.0000 mg | INTRAVENOUS | Status: AC

## 2023-04-02 MED ORDER — EPINEPHRINE PF 1 MG/ML IJ SOLN: 0.3000 mg | Freq: Once | INTRAMUSCULAR | Status: AC | PRN

## 2023-04-02 MED ORDER — SODIUM CHLORIDE 0.9 % IV SOLN: 125.0000 mg | Freq: Once | INTRAVENOUS | Status: AC | PRN

## 2023-04-02 MED ORDER — SODIUM CHLORIDE 0.9% FLUSH: 3.0000 mL | INTRAVENOUS | Status: AC | PRN

## 2023-04-02 MED ORDER — DIPHENHYDRAMINE HCL 50 MG/ML IJ SOLN: 25.0000 mg | Freq: Once | INTRAMUSCULAR | Status: DC | PRN

## 2023-04-02 NOTE — Patient Instructions (Signed)
[name], I greatly value your feedback.  If you receive a survey following your visit with [place] today, we appreciate you taking the time to fill it out.  Thanks, Cathie Beams, DNP, CNM  [facility] HAS MOVED!!! It is now [facility] at [facility] ([address] [place], [place] [address]) Entrance located off of [address] Free 24/7 valet parking   Go to [email or web address] to register for FREE online childbirth classes    Call the office [phone]) or go to [facility] if: You begin to have strong, frequent contractions Your water breaks.  Sometimes it is a big gush of fluid, sometimes it is just a trickle that keeps getting your panties wet or running down your legs You have vaginal bleeding.  It is normal to have a small amount of spotting if your cervix was checked.  You don't feel your baby moving like normal.  If you don't, get you something to eat and drink and lay down and focus on feeling your baby move.  You should feel at least 10 movements in 2 hours.  If you don't, you should call the office or go to [facility].   Home Blood Pressure Monitoring for Patients   Your provider has recommended that you check your blood pressure (BP) at least once a week at home. If you do not have a blood pressure cuff at home, one will be provided for you. Contact your provider if you have not received your monitor within 1 week.   Helpful Tips for Accurate Home Blood Pressure Checks  Don't smoke, exercise, or drink caffeine 30 minutes before checking your BP Use the restroom before checking your BP (a full bladder can raise your pressure) Relax in a comfortable upright chair Feet on the ground Left arm resting comfortably on a flat surface at the level of your heart Legs uncrossed Back supported Sit quietly and don't talk Place the cuff on your bare arm Adjust snuggly, so that only two fingertips can fit between your skin and the top  of the cuff Check 2 readings separated by at least one minute Keep a log of your BP readings For a visual, please reference this diagram: http://ccnc.care/bpdiagram  Provider Name: [facility]     Phone: [phone]  Zone 1: ALL CLEAR  Continue to monitor your symptoms:  BP reading is less than 140 (top number) or less than 90 (bottom number)  No right upper stomach pain No headaches or seeing spots No feeling nauseated or throwing up No swelling in face and hands  Zone 2: CAUTION Call your doctor's office for any of the following:  BP reading is greater than 140 (top number) or greater than 90 (bottom number)  Stomach pain under your ribs in the middle or right side Headaches or seeing spots Feeling nauseated or throwing up Swelling in face and hands  Zone 3: EMERGENCY  Seek immediate medical care if you have any of the following:  BP reading is greater than160 (top number) or greater than 110 (bottom number) Severe headaches not improving with Tylenol Serious difficulty catching your breath Any worsening symptoms from Zone 2

## 2023-04-02 NOTE — Telephone Encounter (Signed)
Returned patient's call.  States she wants to go to [place] due to her schedule.  Phone number given for infusion center and advised to call.  If unable to get sep up there, to let us know. Verbalized understanding.

## 2023-04-02 NOTE — Progress Notes (Signed)
HIGH-RISK PREGNANCY VISIT Patient name: Donna Conrad MRN [id number]  Date of birth: 01-12-1996 Chief Complaint:   Routine Prenatal Visit (Have not taking morning medication yet. Seem  women's 10-23- b/p problem)  History of Present Illness:   Donna Conrad is a 27 y.o. G21P0101 female at [redacted]w[redacted]d with an Estimated Date of Delivery: 06/11/23 being seen today for ongoing management of a high-risk pregnancy complicated by chronic hypertension currently on Procardia, although she has not been taking it for a while.  Went to [facility] last week for cramps (much better now), Hgb was incidentally found to be 7.8 (from 10.0 10 days prior).. Today Hgb is 6.9. Denies any trauma, pain.  Pt does eat ice.  Discussed w/Dr. Charlotta Newton, odd but no red flags w/o pain/bleeding other sx).    Today she reports no complaints. Contractions: Not present.  .  Movement: Present. denies leaking of fluid.      03/12/2023   10:41 AM 02/19/2023    9:26 AM 02/01/2023    8:22 AM 12/01/2022    9:09 AM  Depression screen PHQ 2/9  Decreased Interest  0 1 2  Down, Depressed, Hopeless  1 2 2   PHQ - 2 Score  1 3 4   Altered sleeping  3 0 3  Tired, decreased energy  3 3 3   Change in appetite  2 2 3   Feeling bad or failure about yourself   2 3 3   Trouble concentrating  2 3 3   Moving slowly or fidgety/restless  0 0 2  Suicidal thoughts  0 0 1  PHQ-9 Score  13 14 22   Difficult doing work/chores         Information is confidential and restricted. Go to Review Flowsheets to unlock data.        02/19/2023    9:28 AM 02/01/2023    8:25 AM 12/01/2022    9:10 AM  GAD 7 : Generalized Anxiety Score  Nervous, Anxious, on Edge 3 3 3   Control/stop worrying 2 3 3   Worry too much - different things 2 3 3   Trouble relaxing 2 3 3   Restless 1 0 0  Easily annoyed or irritable 3 3 3   Afraid - awful might happen 3 3 3   Total GAD 7 Score 16 18 18      Review of Systems:   Pertinent items are noted in HPI Denies abnormal vaginal discharge w/  itching/odor/irritation, headaches, visual changes, shortness of breath, chest pain, abdominal pain, severe nausea/vomiting, or problems with urination or bowel movements unless otherwise stated above. Pertinent History Reviewed:  Reviewed past medical,surgical, social, obstetrical and family history.  Reviewed problem list, medications and allergies. Physical Assessment:   Vitals:   04/02/23 0828  BP: (!) 133/92  Pulse: 82  Weight: 198 lb (89.8 kg)  Body mass index is 35.07 kg/m.           Physical Examination:   General appearance: alert, well appearing, and in no distress  Mental status: alert, oriented to person, place, and time  Skin: warm & dry   Extremities: Edema: None    Cardiovascular: normal heart rate noted  Respiratory: normal respiratory effort, no distress  Abdomen: gravid, soft, non-tender  Pelvic: Cervical exam deferred         Chaperone: N/A    Fetal Status: Fetal Heart Rate (bpm): 148 Fundal Height: 33 cm Movement: Present    Fetal Surveillance Testing today: doppler     Results for orders placed or performed  in visit on 04/02/23 (from the past 24 hour(s))  POC Urinalysis Dipstick OB   Collection Time: 04/02/23  8:46 AM  Result Value Ref Range   Color, UA     Clarity, UA     Glucose, UA Negative Negative   Bilirubin, UA     Ketones, UA negative    Spec Grav, UA     Blood, UA negative    pH, UA     POC,PROTEIN,UA Negative Negative, Trace, Small (1+), Moderate (2+), Large (3+), 4+   Urobilinogen, UA     Nitrite, UA negative    Leukocytes, UA Negative Negative   Appearance     Odor    POCT hemoglobin   Collection Time: 04/02/23  9:06 AM  Result Value Ref Range   Hemoglobin 6.9 (A) 11 - 14.6 g/dL    Assessment & Plan:  High-risk pregnancy: G2P0101 at [redacted]w[redacted]d with an Estimated Date of Delivery: 06/11/23   1. [redacted] weeks gestation of pregnancy   2. Chronic hypertension affecting pregnancy Plans to resume procardia; continue ASA  3. Herpes Already  taking Valtrex   4. History of cesarean delivery, currently pregnant Plans Repeat  5. Major depressive disorder, recurrent episode, moderate (HCC) Feels well, just started seeing a new therapist  6. Supervision of high risk pregnancy in third trimester  7.  Anemia  IV iron scheduled twice; encouraged to try to limit ice    Labs/procedures today: Hgb 6.9   Reviewed: Preterm labor symptoms and general obstetric precautions including but not limited to vaginal bleeding, contractions, leaking of fluid and fetal movement were reviewed in detail with the patient.  All questions were answered. Does have home bp cuff. Office bp cuff given: not applicable. Check bp weekly, let us know if consistently >150 and/or >95.  Follow-up: Return for As scheduled.   Future Appointments  Date Time Provider Department Center  04/13/2023  8:30 AM [facility] - [facility]TOBGYN [place] [facility] None  04/13/2023  9:50 AM Myna Hidalgo, DO [facility] FTOBGYN  04/17/2023  9:30 AM [facility]-FTOBGYN NURSE [facility] FTOBGYN  04/20/2023  8:15 AM [facility]  04/24/2023  9:30 AM [facility]-FTOBGYN NURSE [facility] FTOBGYN  04/27/2023  8:30 AM [facility] - [facility]TOBGYN [place] [facility] None  04/27/2023  9:30 AM Lazaro Arms, MD [facility] FTOBGYN  05/01/2023 11:30 AM [facility] - FTOBGYN [place] [facility] None  05/01/2023  1:30 PM Myna Hidalgo, DO [facility]-FT FTOBGYN  05/08/2023  9:30 AM [facility]-FTOBGYN NURSE [facility] FTOBGYN  05/11/2023  8:30 AM [facility] - FTOBGYN [place] [facility] None  05/11/2023  9:30 AM Lazaro Arms, MD [facility] FTOBGYN  05/15/2023  9:30 AM [facility]-FTOBGYN NURSE [facility] FTOBGYN  05/18/2023 10:00 AM [facility] - FTOBGYN [place] [facility] None  05/18/2023 10:50 AM Myna Hidalgo, DO [facility] FTOBGYN  05/22/2023  9:30 AM [facility]-FTOBGYN NURSE [facility] FTOBGYN  05/22/2023 10:00 AM [facility] - FT IMG 2 [facility] None  05/22/2023 10:50 AM Cheral Marker, CNM [facility] FTOBGYN  06/01/2023 10:00 AM [facility] - FTOBGYN [place] [facility] None  06/05/2023  9:30 AM [facility]-FTOBGYN NURSE [facility]-FT FTOBGYN   06/08/2023  8:30 AM [facility] - FTOBGYN [place] [facility] None  06/12/2023  9:30 AM [facility]-FTOBGYN NURSE [facility]-FT FTOBGYN     Jacklyn Shell , DNP, CNM [facility] 04/02/2023 11:22 AM

## 2023-04-02 NOTE — Telephone Encounter (Signed)
Patient called about the iron infusion. She wants to get scheduled in [place] due to having to work and needing a weekend option. Please advise.

## 2023-04-03 ENCOUNTER — Telehealth: Payer: Self-pay | Admitting: Obstetrics & Gynecology

## 2023-04-03 NOTE — Telephone Encounter (Signed)
Donna Conrad called to let us know that she needed a referral to which kind of iron infusion it was she needed. Please advise.

## 2023-04-04 ENCOUNTER — Encounter: Payer: Self-pay | Admitting: *Deleted

## 2023-04-06 ENCOUNTER — Other Ambulatory Visit: Payer: Self-pay | Admitting: Obstetrics & Gynecology

## 2023-04-06 DIAGNOSIS — O10919 Unspecified pre-existing hypertension complicating pregnancy, unspecified trimester: Secondary | ICD-10-CM

## 2023-04-06 NOTE — Progress Notes (Signed)
Orders for [place] with MFM

## 2023-04-10 ENCOUNTER — Encounter: Payer: Medicaid Other | Attending: Advanced Practice Midwife | Admitting: Internal Medicine

## 2023-04-10 VITALS — BP 132/85 | HR 79 | Temp 98.2°F | Resp 16

## 2023-04-10 DIAGNOSIS — Z3A00 Weeks of gestation of pregnancy not specified: Secondary | ICD-10-CM | POA: Insufficient documentation

## 2023-04-10 DIAGNOSIS — O99013 Anemia complicating pregnancy, third trimester: Secondary | ICD-10-CM | POA: Insufficient documentation

## 2023-04-10 MED ORDER — DIPHENHYDRAMINE HCL 25 MG PO CAPS
25.0000 mg | ORAL_CAPSULE | Freq: Once | ORAL | Status: AC
  Administered 2023-04-10: 25 mg via ORAL
  Filled 2023-04-10: qty 1

## 2023-04-10 MED ORDER — ACETAMINOPHEN 325 MG PO TABS
650.0000 mg | ORAL_TABLET | Freq: Once | ORAL | Status: AC
Start: 2023-04-10 — End: 2023-04-10
  Administered 2023-04-10: 650 mg via ORAL
  Filled 2023-04-10: qty 2

## 2023-04-10 MED ORDER — IRON SUCROSE 500 MG IVPB - SIMPLE MED
500.0000 mg | Freq: Once | INTRAVENOUS | Status: AC
  Administered 2023-04-10: 500 mg via INTRAVENOUS
  Filled 2023-04-10: qty 25

## 2023-04-10 NOTE — BH Specialist Note (Signed)
Integrated Behavioral Health via Telemedicine Visit  04/10/2023 [name] [id number]  Number of Integrated Behavioral Health Clinician visits: 3- Third Visit  Session Start time: 0917   Session End time: 0937  Total time in minutes: 20   Referring Provider: Anola Gurney, MD Patient/Family location: Home*** [facility] Provider location: [facility]  All persons participating in visit: [name] and [facility] Michaeline Eckersley ***  Types of Service: {CHL AMB TYPE OF SERVICE:[phone]}  I connected with [name] and/or Merry Proud C Barcomb's {family members:20773} via  Telephone or Engineer, civil (consulting)  (Video is Surveyor, mining) and verified that I am speaking with the correct person using two identifiers. Discussed confidentiality: Yes   I discussed the limitations of telemedicine and the availability of in person appointments.  Discussed there is a possibility of technology failure and discussed alternative modes of communication if that failure occurs.  I discussed that engaging in this telemedicine visit, they consent to the provision of behavioral healthcare and the services will be billed under their insurance.  Patient and/or legal guardian expressed understanding and consented to Telemedicine visit: Yes   Presenting Concerns: Patient and/or family reports the following symptoms/concerns: *** Duration of problem: ***; Severity of problem: {Mild/Moderate/Severe:20260}  Patient and/or Family's Strengths/Protective Factors: {CHL AMB BH PROTECTIVE FACTORS:[id number]}  Goals Addressed: Patient will:  Reduce symptoms of: {IBH Symptoms:21014056}   Increase knowledge and/or ability of: {IBH Patient Tools:21014057}   Demonstrate ability to: {IBH Goals:21014053}  Progress towards Goals: {CHL AMB BH PROGRESS TOWARDS [phone]}  Interventions: Interventions utilized:  {IBH  Interventions:21014054} Standardized Assessments completed: {IBH Screening Tools:21014051}  Patient and/or Family Response: Patient agrees with treatment plan.   Assessment: Patient currently experiencing ***.   Patient may benefit from continued therapeutic intervention *** .  Plan: Follow up with behavioral health clinician on : *** Behavioral recommendations:  -*** -*** Referral(s): {IBH Referrals:21014055}  I discussed the assessment and treatment plan with the patient and/or parent/guardian. They were provided an opportunity to ask questions and all were answered. They agreed with the plan and demonstrated an understanding of the instructions.   They were advised to call back or seek an in-person evaluation if the symptoms worsen or if the condition fails to improve as anticipated.  Rae Lips, LCSW     03/12/2023   10:41 AM 02/19/2023    9:26 AM 02/01/2023    8:22 AM 12/01/2022    9:09 AM  Depression screen PHQ 2/9  Decreased Interest  0 1 2  Down, Depressed, Hopeless  1 2 2   PHQ - 2 Score  1 3 4   Altered sleeping  3 0 3  Tired, decreased energy  3 3 3   Change in appetite  2 2 3   Feeling bad or failure about yourself   2 3 3   Trouble concentrating  2 3 3   Moving slowly or fidgety/restless  0 0 2  Suicidal thoughts  0 0 1  PHQ-9 Score  13 14 22   Difficult doing work/chores         Information is confidential and restricted. Go to Review Flowsheets to unlock data.      02/19/2023    9:28 AM 02/01/2023    8:25 AM 12/01/2022    9:10 AM  GAD 7 : Generalized Anxiety Score  Nervous, Anxious, on Edge 3 3 3   Control/stop worrying 2 3 3   Worry too much - different things 2 3 3   Trouble  relaxing 2 3 3   Restless 1 0 0  Easily annoyed or irritable 3 3 3   Afraid - awful might happen 3 3 3   Total GAD 7 Score 16 18 18    /

## 2023-04-10 NOTE — Progress Notes (Signed)
Diagnosis: Iron Deficiency Anemia  Provider:  Jacklyn Shell CNM  Procedure: IV Infusion  IV Type: Peripheral, IV Location: L Forearm  Venofer (Iron Sucrose), Dose: 500 mg  Infusion Start Time: 0951  Infusion Stop Time: 1441  Post Infusion IV Care: Observation period completed  Discharge: Condition: Good, Destination: Home . AVS Provided  Performed by:  Feliberto Harts, LPN

## 2023-04-13 ENCOUNTER — Ambulatory Visit (INDEPENDENT_AMBULATORY_CARE_PROVIDER_SITE_OTHER): Payer: Medicaid Other

## 2023-04-13 ENCOUNTER — Ambulatory Visit (INDEPENDENT_AMBULATORY_CARE_PROVIDER_SITE_OTHER): Payer: Medicaid Other | Admitting: Obstetrics & Gynecology

## 2023-04-13 ENCOUNTER — Encounter: Payer: Self-pay | Admitting: Obstetrics & Gynecology

## 2023-04-13 VITALS — BP 123/84 | HR 89 | Wt 193.4 lb

## 2023-04-13 DIAGNOSIS — F5089 Other specified eating disorder: Secondary | ICD-10-CM

## 2023-04-13 DIAGNOSIS — O34219 Maternal care for unspecified type scar from previous cesarean delivery: Secondary | ICD-10-CM

## 2023-04-13 DIAGNOSIS — O0992 Supervision of high risk pregnancy, unspecified, second trimester: Secondary | ICD-10-CM

## 2023-04-13 DIAGNOSIS — Z3A31 31 weeks gestation of pregnancy: Secondary | ICD-10-CM

## 2023-04-13 DIAGNOSIS — O10913 Unspecified pre-existing hypertension complicating pregnancy, third trimester: Secondary | ICD-10-CM | POA: Diagnosis not present

## 2023-04-13 DIAGNOSIS — O10919 Unspecified pre-existing hypertension complicating pregnancy, unspecified trimester: Secondary | ICD-10-CM

## 2023-04-13 DIAGNOSIS — B009 Herpesviral infection, unspecified: Secondary | ICD-10-CM

## 2023-04-13 DIAGNOSIS — O0993 Supervision of high risk pregnancy, unspecified, third trimester: Secondary | ICD-10-CM

## 2023-04-13 NOTE — Progress Notes (Signed)
HIGH-RISK PREGNANCY VISIT Patient name: Donna Conrad MRN [id number]  Date of birth: April 24, 1996 Chief Complaint:   Routine Prenatal Visit  History of Present Illness:   Donna Conrad is a 27 y.o. G14P0101 female at [redacted]w[redacted]d with an Estimated Date of Delivery: 06/11/23 being seen today for ongoing management of a high-risk pregnancy complicated by:  -Chronic HTN- on Procardia 60XL daily Of note patient was not taking regularly because she thought it was causing heart palpitations.  When she started doing her iron infusion she was informed that the anemia could also be the cause of her racing heart and when she went for her transfusion her blood pressure was 140s over 90s.  She has since restarted the Procardia  Today she reports no complaints.   Contractions: Not present. Vag. Bleeding: None.  Movement: Present. denies leaking of fluid.      04/10/2023    9:15 AM 03/12/2023   10:41 AM 02/19/2023    9:26 AM 02/01/2023    8:22 AM 12/01/2022    9:09 AM  Depression screen PHQ 2/9  Decreased Interest 1  0 1 2  Down, Depressed, Hopeless 1  1 2 2   PHQ - 2 Score 2  1 3 4   Altered sleeping 1  3 0 3  Tired, decreased energy 1  3 3 3   Change in appetite 3  2 2 3   Feeling bad or failure about yourself  1  2 3 3   Trouble concentrating 1  2 3 3   Moving slowly or fidgety/restless 0  0 0 2  Suicidal thoughts 0  0 0 1  PHQ-9 Score 9  13 14 22   Difficult doing work/chores Not difficult at all         Information is confidential and restricted. Go to Review Flowsheets to unlock data.     Current Outpatient Medications  Medication Instructions   aspirin EC 162 mg, Oral, Daily, Take after 12 weeks for prevention of preeclampsia later in pregnancy   Blood Pressure Monitor MISC For regular home bp monitoring during pregnancy   cyclobenzaprine (FLEXERIL) 5 mg, Oral, 3 times daily PRN   ferrous gluconate (FERGON) 324 mg, Oral, Every other day   NIFEdipine (PROCARDIA XL/NIFEDICAL XL) 60 mg, Oral, Daily    omeprazole (PRILOSEC) 20 mg, Oral, Daily, 1 tablet a day   Prenatal Vit-Fe Fumarate-FA (PRENATAL VITAMIN PO) Oral   SODIUM FLUORIDE 5000 PPM 1.1 % PSTE Oral   valACYclovir (VALTREX) 500 mg, Oral, 2 times daily     Review of Systems:   Pertinent items are noted in HPI Denies abnormal vaginal discharge w/ itching/odor/irritation, headaches, visual changes, shortness of breath, chest pain, abdominal pain, severe nausea/vomiting, or problems with urination or bowel movements unless otherwise stated above. Pertinent History Reviewed:  Reviewed past medical,surgical, social, obstetrical and family history.  Reviewed problem list, medications and allergies. Physical Assessment:   Vitals:   04/13/23 0859  BP: 123/84  Pulse: 89  Weight: 193 lb 6.4 oz (87.7 kg)  Body mass index is 34.26 kg/m.           Physical Examination:   General appearance: alert, well appearing, and in no distress  Mental status: normal mood, behavior, speech, dress, motor activity, and thought processes  Skin: warm & dry   Extremities:   no edema   Cardiovascular: normal heart rate noted  Respiratory: normal respiratory effort, no distress  Abdomen: gravid, soft, non-tender  Pelvic: Cervical exam deferred  Fetal Status:     Movement: Present    Fetal Surveillance Testing today: cephalic,posterior placenta gr 3,FHR 135 bpm,AFI 19 cm,EFW 1893 g 54%    Chaperone: N/A    No results found for this or any previous visit (from the past 24 hour(s)).   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   Labs/procedures today: BPP  Treatment Plan:  as outlined above  Reviewed: Preterm labor symptoms and general obstetric precautions including but not limited to vaginal bleeding, contractions, leaking of fluid and fetal movement were reviewed in detail with the patient.  All questions were answered. Pt has home bp cuff. Check bp weekly, let us know if >140/90.   Follow-up: No follow-ups on file.   Future Appointments  Date Time Provider Department Center  04/13/2023  9:50 AM Myna Hidalgo, DO [facility] F[facility]YN  04/17/2023 10:45 AM [facility] - FTOBGYN [place] [facility] None  04/20/2023  8:15 AM [facility]  04/20/2023 10:50 AM [facility]-FTOBGYN NURSE [facility] FTOBGYN  04/20/2023 11:10 AM Myna Hidalgo, DO [facility] FTOBGYN  04/24/2023  9:30 AM [facility]-FTOBGYN NURSE [facility] FTO[facility]YN  04/24/2023 10:30 AM AP[facility]-CHAIR 1 AP-INFCTR None  04/27/2023  8:30 AM [facility] - FTOBGYN [place] [facility] None  04/27/2023  9:30 AM Lazaro Arms, MD [facility] FTOBGYN  05/01/2023 11:30 AM [facility] - FTO[facility]YN [place] [facility] None  05/01/2023  1:30 PM Myna Hidalgo, DO [facility] FTO[facility]YN  05/08/2023  9:30 AM [facility]-FTOBGYN NURSE [facility]TO[facility]YN  05/11/2023  8:30 AM [facility] - FTO[facility]YN [place] [facility] None  05/11/2023  9:30 AM Lazaro Arms, MD [facility] FTO[facility]YN  05/15/2023  9:30 AM [facility]-FTOBGYN NURSE [facility]TO[facility]YN  05/18/2023 10:00 AM [facility] - FTOBGYN [place] [facility] None  05/18/2023 10:50 AM Myna Hidalgo, DO [facility] FTOBGYN  05/22/2023  9:30 AM [facility]-FTOBGYN NURSE [facility] FTO[facility]YN  05/22/2023 10:00 AM [facility] - FT IMG 2 [facility] None  05/22/2023 10:50 AM Cheral Marker, CNM [facility]-FT FTOBGYN  05/31/2023  7:15 AM [facility]-MFC NURSE [facility]  05/31/2023  7:30 AM [facility]MFC US2 [facility]MFCUS [facility]  05/31/2023  8:45 AM [facility]MFC NST [facility]FC [facility]  06/01/2023 10:00 AM [facility] - FTOBGYN [place] [facility] None  06/05/2023  9:30 AM [facility]-FTOBGYN NURSE [facility]-FT FTOBGYN   06/08/2023  8:30 AM [facility] - FTOBGYN [place] [facility] None  06/12/2023  9:30 AM [facility]-FTOBGYN NURSE [facility]-FT FTOBGYN    No orders of the defined types were placed in this encounter.   Myna Hidalgo, DO Attending Obstetrician & Gynecologist, [facility] for [employer], [facility]

## 2023-04-13 NOTE — Progress Notes (Signed)
[place] 31+4 wks,cephalic,posterior placenta gr 3,FHR 135 bpm,AFI 19 cm,EFW 1893 g 54%

## 2023-04-16 ENCOUNTER — Other Ambulatory Visit: Payer: Self-pay | Admitting: Obstetrics & Gynecology

## 2023-04-16 DIAGNOSIS — O10919 Unspecified pre-existing hypertension complicating pregnancy, unspecified trimester: Secondary | ICD-10-CM

## 2023-04-16 DIAGNOSIS — O0993 Supervision of high risk pregnancy, unspecified, third trimester: Secondary | ICD-10-CM

## 2023-04-16 NOTE — Progress Notes (Signed)
Orders for C-section

## 2023-04-17 ENCOUNTER — Other Ambulatory Visit: Payer: Medicaid Other

## 2023-04-17 ENCOUNTER — Encounter: Payer: Medicaid Other | Admitting: Women's Health

## 2023-04-17 DIAGNOSIS — O10919 Unspecified pre-existing hypertension complicating pregnancy, unspecified trimester: Secondary | ICD-10-CM | POA: Diagnosis not present

## 2023-04-17 DIAGNOSIS — O0993 Supervision of high risk pregnancy, unspecified, third trimester: Secondary | ICD-10-CM

## 2023-04-17 DIAGNOSIS — Z3A32 32 weeks gestation of pregnancy: Secondary | ICD-10-CM | POA: Diagnosis not present

## 2023-04-17 DIAGNOSIS — F5089 Other specified eating disorder: Secondary | ICD-10-CM

## 2023-04-17 DIAGNOSIS — O34219 Maternal care for unspecified type scar from previous cesarean delivery: Secondary | ICD-10-CM

## 2023-04-17 NOTE — Progress Notes (Signed)
[place] 32+1 wks,cephalic,BPP 8/8,FHR 157 bpm,posterior fundal placenta gr 3,AFI 18 cm,elevated UAD with EDF,RI .77,.75,.82=98%

## 2023-04-20 ENCOUNTER — Encounter: Payer: Self-pay | Admitting: Obstetrics & Gynecology

## 2023-04-20 ENCOUNTER — Other Ambulatory Visit: Payer: Medicaid Other

## 2023-04-20 ENCOUNTER — Ambulatory Visit: Payer: Medicaid Other | Admitting: Clinical

## 2023-04-20 ENCOUNTER — Ambulatory Visit (INDEPENDENT_AMBULATORY_CARE_PROVIDER_SITE_OTHER): Payer: Medicaid Other | Admitting: Obstetrics & Gynecology

## 2023-04-20 VITALS — BP 126/82 | HR 82 | Wt 192.0 lb

## 2023-04-20 DIAGNOSIS — Z3A32 32 weeks gestation of pregnancy: Secondary | ICD-10-CM | POA: Diagnosis not present

## 2023-04-20 DIAGNOSIS — Z331 Pregnant state, incidental: Secondary | ICD-10-CM

## 2023-04-20 DIAGNOSIS — Z23 Encounter for immunization: Secondary | ICD-10-CM | POA: Diagnosis not present

## 2023-04-20 DIAGNOSIS — Z1389 Encounter for screening for other disorder: Secondary | ICD-10-CM

## 2023-04-20 DIAGNOSIS — Z91199 Patient's noncompliance with other medical treatment and regimen due to unspecified reason: Secondary | ICD-10-CM

## 2023-04-20 DIAGNOSIS — O0993 Supervision of high risk pregnancy, unspecified, third trimester: Secondary | ICD-10-CM | POA: Diagnosis not present

## 2023-04-20 LAB — POCT URINALYSIS DIPSTICK OB
Blood, UA: NEGATIVE
Glucose, UA: NEGATIVE
Ketones, UA: 2
Leukocytes, UA: NEGATIVE
Nitrite, UA: NEGATIVE
POC,PROTEIN,UA: NEGATIVE

## 2023-04-20 NOTE — Progress Notes (Signed)
HIGH-RISK PREGNANCY VISIT Patient name: Donna Conrad MRN [id number]  Date of birth: 05/14/1996 Chief Complaint:   Routine Prenatal Visit and Non-stress Test (Tdap information given)  History of Present Illness:   Donna Conrad is a 27 y.o. G5P0101 female at [redacted]w[redacted]d with an Estimated Date of Delivery: 06/11/23 being seen today for ongoing management of a high-risk pregnancy complicated by :  1) Chronic HTN Stable with procardia 60mg  daily 2) Prior C-section- desires repeat 3) HSV- on suppression  Today she reports  notes episode of urinary discomfort after intercourse earlier this week.  Denies vaginal discharge itching or irritation.  Denies pelvic or abdominal pain.  Patient is requesting STI screening .   Contractions: Not present.  .  Movement: Present. denies leaking of fluid.      04/10/2023    9:15 AM 03/12/2023   10:41 AM 02/19/2023    9:26 AM 02/01/2023    8:22 AM 12/01/2022    9:09 AM  Depression screen PHQ 2/9  Decreased Interest 1  0 1 2  Down, Depressed, Hopeless 1  1 2 2   PHQ - 2 Score 2  1 3 4   Altered sleeping 1  3 0 3  Tired, decreased energy 1  3 3 3   Change in appetite 3  2 2 3   Feeling bad or failure about yourself  1  2 3 3   Trouble concentrating 1  2 3 3   Moving slowly or fidgety/restless 0  0 0 2  Suicidal thoughts 0  0 0 1  PHQ-9 Score 9  13 14 22   Difficult doing work/chores Not difficult at all         Information is confidential and restricted. Go to Review Flowsheets to unlock data.     Current Outpatient Medications  Medication Instructions   aspirin EC 162 mg, Oral, Daily, Take after 12 weeks for prevention of preeclampsia later in pregnancy   Blood Pressure Monitor MISC For regular home bp monitoring during pregnancy   cyclobenzaprine (FLEXERIL) 5 mg, Oral, 3 times daily PRN   ferrous gluconate (FERGON) 324 mg, Oral, Every other day   NIFEdipine (PROCARDIA XL/NIFEDICAL XL) 60 mg, Oral, Daily   omeprazole (PRILOSEC) 20 mg, Oral, Daily, 1  tablet a day   Prenatal Vit-Fe Fumarate-FA (PRENATAL VITAMIN PO) Oral   SODIUM FLUORIDE 5000 PPM 1.1 % PSTE Oral   valACYclovir (VALTREX) 500 mg, Oral, 2 times daily     Review of Systems:   Pertinent items are noted in HPI Denies headaches, visual changes, shortness of breath, chest pain, abdominal pain, severe nausea/vomiting, or problems with urination or bowel movements unless otherwise stated above. Pertinent History Reviewed:  Reviewed past medical,surgical, social, obstetrical and family history.  Reviewed problem list, medications and allergies. Physical Assessment:   Vitals:   04/20/23 1057 04/20/23 1124  BP: (!) 130/90 126/82  Pulse: 91 82  Weight: 192 lb (87.1 kg)   Body mass index is 34.01 kg/m.           Physical Examination:   General appearance: alert, well appearing, and in no distress  Mental status: normal mood, behavior, speech, dress, motor activity, and thought processes  Skin: warm & dry   Extremities: Edema: None    Cardiovascular: normal heart rate noted  Respiratory: normal respiratory effort, no distress  Abdomen: gravid, soft, non-tender  Pelvic: Cervical exam deferred         Fetal Status:     Movement: Present    Fetal  Surveillance Testing today: NST  NST being performed due to Chronic HTN   Fetal Monitoring:  Baseline: 135 bpm, Variability: moderate, Accelerations: present, The accelerations are >15 bpm and more than 2 in 20 minutes, and Decelerations: Absent     Final diagnosis:   Reactive NST      Chaperone: N/A    Results for orders placed or performed in visit on 04/20/23 (from the past 24 hour(s))  POC Urinalysis Dipstick OB   Collection Time: 04/20/23 11:29 AM  Result Value Ref Range   Color, UA     Clarity, UA     Glucose, UA Negative Negative   Bilirubin, UA     Ketones, UA 2    Spec Grav, UA     Blood, UA negative    pH, UA     POC,PROTEIN,UA Negative Negative, Trace, Small (1+), Moderate (2+), Large (3+), 4+    Urobilinogen, UA     Nitrite, UA negative    Leukocytes, UA Negative Negative   Appearance     Odor       Assessment & Plan:  High-risk pregnancy: G2P0101 at [redacted]w[redacted]d with an Estimated Date of Delivery: 06/11/23   1) Chronic HTN Reactive NST Continue antepartum testing Delivery scheduled 2) Prior C-section- rpt scheduled 12/20 3) HSV- on suppression asymptomatic 4) STI Screening []  advised testing next visit as it had been less than a week from "exposure"   Meds: No orders of the defined types were placed in this encounter.   Labs/procedures today: NST, Tdap today  Treatment Plan:  as outlined above and routine OB care  Reviewed: Preterm labor symptoms and general obstetric precautions including but not limited to vaginal bleeding, contractions, leaking of fluid and fetal movement were reviewed in detail with the patient.  All questions were answered. Pt has home bp cuff. Check bp weekly, let us know if >140/90.   Follow-up: Return for twice weekly as scheduled.   Future Appointments  Date Time Provider Department Center  04/24/2023  9:30 AM [facility]-FTOBGYN NURSE [facility]FT FTOBGYN  04/24/2023 10:30 AM APINF-CHAIR 1 AP-INFCTR None  04/27/2023  8:30 AM [facility] - FTOBGYN [place] [facility]G None  04/27/2023  9:30 AM Lazaro Arms, MD [facility]FT FTOBGYN  05/01/2023 11:30 AM [facility] - FTOBGYN [place] [facility] None  05/01/2023  1:30 PM Myna Hidalgo, DO [facility]-FT FTOBGYN  05/08/2023  9:30 AM [facility]-FTOBGYN NURSE [facility]-FT FTOBGYN  05/11/2023  8:30 AM [facility] - FTOBGYN [place] [facility] None  05/11/2023  9:30 AM Lazaro Arms, MD [facility]FT FTOBGYN  05/15/2023  9:30 AM [facility]-FTOBGYN NURSE [facility]-FT FTOBGYN  05/18/2023 10:00 AM [facility] - FTOBGYN [place] [facility] None  05/18/2023 10:50 AM Myna Hidalgo, DO [facility]-FT FTOBGYN  05/22/2023  9:30 AM [facility]-FTOBGYN NURSE [facility]-FT FTOBGYN  05/22/2023 10:00 AM [facility] - FT IMG 2 [facility]-FTIMG None  05/22/2023 10:50 AM Cheral Marker, CNM [facility]-FT FTOBGYN    Orders Placed This Encounter  Procedures   Tdap  vaccine greater than or equal to 7yo IM   POC Urinalysis Dipstick OB    Myna Hidalgo, DO Attending Obstetrician & Gynecologist, [facility] for [employer], [facility]

## 2023-04-24 ENCOUNTER — Encounter: Payer: Medicaid Other | Admitting: Emergency Medicine

## 2023-04-24 ENCOUNTER — Ambulatory Visit (INDEPENDENT_AMBULATORY_CARE_PROVIDER_SITE_OTHER): Payer: Medicaid Other | Admitting: *Deleted

## 2023-04-24 VITALS — BP 129/83 | HR 80

## 2023-04-24 VITALS — BP 136/80 | HR 88 | Temp 98.5°F | Resp 16

## 2023-04-24 DIAGNOSIS — Z3A33 33 weeks gestation of pregnancy: Secondary | ICD-10-CM

## 2023-04-24 DIAGNOSIS — O99013 Anemia complicating pregnancy, third trimester: Secondary | ICD-10-CM

## 2023-04-24 DIAGNOSIS — O0993 Supervision of high risk pregnancy, unspecified, third trimester: Secondary | ICD-10-CM | POA: Diagnosis not present

## 2023-04-24 DIAGNOSIS — O10913 Unspecified pre-existing hypertension complicating pregnancy, third trimester: Secondary | ICD-10-CM | POA: Diagnosis not present

## 2023-04-24 DIAGNOSIS — O10919 Unspecified pre-existing hypertension complicating pregnancy, unspecified trimester: Secondary | ICD-10-CM

## 2023-04-24 MED ORDER — DIPHENHYDRAMINE HCL 25 MG PO CAPS
25.0000 mg | ORAL_CAPSULE | Freq: Once | ORAL | Status: AC
  Administered 2023-04-24: 25 mg via ORAL

## 2023-04-24 MED ORDER — IRON SUCROSE 500 MG IVPB - SIMPLE MED
500.0000 mg | Freq: Once | INTRAVENOUS | Status: DC
  Filled 2023-04-24: qty 275

## 2023-04-24 MED ORDER — SODIUM CHLORIDE 0.9 % IV SOLN
500.0000 mg | INTRAVENOUS | Status: DC
  Filled 2023-04-24: qty 25

## 2023-04-24 MED ORDER — IRON SUCROSE 500 MG IVPB - SIMPLE MED: 500.0000 mg | Freq: Once | INTRAVENOUS | Status: DC

## 2023-04-24 MED ORDER — IRON SUCROSE 500 MG IVPB - SIMPLE MED
500.0000 mg | Freq: Once | INTRAVENOUS | Status: AC
  Administered 2023-04-24: 500 mg via INTRAVENOUS
  Filled 2023-04-24: qty 275

## 2023-04-24 MED ORDER — ACETAMINOPHEN 325 MG PO TABS
650.0000 mg | ORAL_TABLET | Freq: Once | ORAL | Status: AC
  Administered 2023-04-24: 650 mg via ORAL

## 2023-04-24 MED ORDER — SODIUM CHLORIDE 0.9 % IV SOLN
500.0000 mg | Freq: Once | INTRAVENOUS | 0 refills | Status: AC
Start: 2023-04-24 — End: 2023-04-24

## 2023-04-24 NOTE — Progress Notes (Signed)
Diagnosis: Iron Deficiency Anemia  Provider:  Jacklyn Shell CNM  Procedure: IV Infusion  IV Type: Peripheral, IV Location: left wrist  Venofer (Iron Sucrose), Dose: 500 mg  Infusion Start Time: 1057  Infusion Stop Time: 1520  Post Infusion IV Care: Observation period completed and Peripheral IV Discontinued  Discharge: Condition: Good, Destination: Home . AVS Provided  Performed by:  Arrie Senate, RN

## 2023-04-24 NOTE — Progress Notes (Signed)
   NURSE VISIT- NST  SUBJECTIVE:  Donna Conrad is a 27 y.o. G71P0101 female at [redacted]w[redacted]d, here for a NST for pregnancy complicated by [facility].  She reports active fetal movement, contractions: none, vaginal bleeding: none, membranes: intact.   OBJECTIVE:  BP 129/83   Pulse 80   LMP 09/04/2022 (Exact Date)   Appears well, no apparent distress  No results found for this or any previous visit (from the past 24 hour(s)).  NST: FHR baseline 130 bpm, Variability: moderate, Accelerations:present, Decelerations:  Absent= Cat 1/reactive Toco: none   ASSESSMENT: G2P0101 at [redacted]w[redacted]d with CHTN NST reactive  PLAN: EFM strip reviewed by Dr. Charlotta Newton   Recommendations: keep next appointment as scheduled    Jobe Marker  04/24/2023 10:05 AM

## 2023-04-25 ENCOUNTER — Other Ambulatory Visit: Payer: Self-pay | Admitting: Obstetrics & Gynecology

## 2023-04-25 DIAGNOSIS — O10919 Unspecified pre-existing hypertension complicating pregnancy, unspecified trimester: Secondary | ICD-10-CM

## 2023-04-27 ENCOUNTER — Encounter: Payer: Self-pay | Admitting: Obstetrics & Gynecology

## 2023-04-27 ENCOUNTER — Ambulatory Visit: Payer: Medicaid Other | Admitting: Obstetrics & Gynecology

## 2023-04-27 ENCOUNTER — Ambulatory Visit (INDEPENDENT_AMBULATORY_CARE_PROVIDER_SITE_OTHER): Payer: Medicaid Other

## 2023-04-27 DIAGNOSIS — O98313 Other infections with a predominantly sexual mode of transmission complicating pregnancy, third trimester: Secondary | ICD-10-CM

## 2023-04-27 DIAGNOSIS — Z3A33 33 weeks gestation of pregnancy: Secondary | ICD-10-CM

## 2023-04-27 DIAGNOSIS — O0993 Supervision of high risk pregnancy, unspecified, third trimester: Secondary | ICD-10-CM

## 2023-04-27 DIAGNOSIS — O34219 Maternal care for unspecified type scar from previous cesarean delivery: Secondary | ICD-10-CM

## 2023-04-27 DIAGNOSIS — A6009 Herpesviral infection of other urogenital tract: Secondary | ICD-10-CM

## 2023-04-27 DIAGNOSIS — D649 Anemia, unspecified: Secondary | ICD-10-CM | POA: Diagnosis not present

## 2023-04-27 DIAGNOSIS — O10919 Unspecified pre-existing hypertension complicating pregnancy, unspecified trimester: Secondary | ICD-10-CM

## 2023-04-27 DIAGNOSIS — O99013 Anemia complicating pregnancy, third trimester: Secondary | ICD-10-CM | POA: Diagnosis not present

## 2023-04-27 DIAGNOSIS — O10013 Pre-existing essential hypertension complicating pregnancy, third trimester: Secondary | ICD-10-CM

## 2023-04-27 DIAGNOSIS — F5089 Other specified eating disorder: Secondary | ICD-10-CM

## 2023-04-27 LAB — POCT URINALYSIS DIPSTICK OB
Blood, UA: NEGATIVE
Glucose, UA: NEGATIVE
Ketones, UA: NEGATIVE
Leukocytes, UA: NEGATIVE
Nitrite, UA: NEGATIVE
POC,PROTEIN,UA: NEGATIVE

## 2023-04-27 LAB — POCT HEMOGLOBIN: Hemoglobin: 10.9 g/dL — AB (ref 11–14.6)

## 2023-04-27 NOTE — Progress Notes (Signed)
[place] 33+4 wks,cephalic,BPP 8/8,FHR 133 bpm,posterior fundal placenta gr 3,RI .78,.73,.76=97%,elevated UAD with EDF

## 2023-04-27 NOTE — Progress Notes (Signed)
HIGH-RISK PREGNANCY VISIT Patient name: Donna Conrad MRN [id number]  Date of birth: Jul 15, 1995 Chief Complaint:   Routine Prenatal Visit (Ultrasound today)  History of Present Illness:   Donna Conrad is a 27 y.o. G63P0101 female at [redacted]w[redacted]d with an Estimated Date of Delivery: 06/11/23 being seen today for ongoing management of a high-risk pregnancy complicated by     ICD-10-CM   1. Supervision of high risk pregnancy in third trimester: ^UAD 97%@[redacted]w[redacted]d   O09.93 POC Urinalysis Dipstick OB    POCT hemoglobin    2. Chronic hypertension: Procardia xl 60 qd, good control  O10.919 POC Urinalysis Dipstick OB    3. Anemia affecting pregnancy in third trimester: S/P Iron infusion, hemoglobin 10.9 today  O99.013 POCT hemoglobin    4. History of cesarean delivery, currently pregnant: repeat 05/25/23@37  weeks  O34.219     5. [redacted] weeks gestation of pregnancy  Z3A.33 POC Urinalysis Dipstick OB    POCT hemoglobin    6. 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  .  .   . denies leaking of fluid.      04/24/2023   10:27 AM 04/10/2023    9:15 AM 03/12/2023   10:41 AM 02/19/2023    9:26 AM 02/01/2023    8:22 AM  Depression screen PHQ 2/9  Decreased Interest 1 1  0 1  Down, Depressed, Hopeless 1 1  1 2   PHQ - 2 Score 2 2  1 3   Altered sleeping 1 1  3  0  Tired, decreased energy 1 1  3 3   Change in appetite 3 3  2 2   Feeling bad or failure about yourself  1 1  2 3   Trouble concentrating 1 1  2 3   Moving slowly or fidgety/restless 1 0  0 0  Suicidal thoughts 0 0  0 0  PHQ-9 Score 10 9  13 14   Difficult doing work/chores Not difficult at all Not difficult at all        Information is confidential and restricted. Go to Review Flowsheets to unlock data.        02/19/2023    9:28 AM 02/01/2023    8:25 AM 12/01/2022    9:10 AM  GAD 7 : Generalized Anxiety Score  Nervous, Anxious, on  Edge 3 3 3   Control/stop worrying 2 3 3   Worry too much - different things 2 3 3   Trouble relaxing 2 3 3   Restless 1 0 0  Easily annoyed or irritable 3 3 3   Afraid - awful might happen 3 3 3   Total GAD 7 Score 16 18 18      Review of Systems:   Pertinent items are noted in HPI Denies abnormal vaginal discharge w/ itching/odor/irritation, headaches, visual changes, shortness of breath, chest pain, abdominal pain, severe nausea/vomiting, or problems with urination or bowel movements unless otherwise stated above. Pertinent History Reviewed:  Reviewed past medical,surgical, social, obstetrical and family history.  Reviewed problem list, medications and allergies. Physical Assessment:  There were no vitals filed for this visit.There is no height or weight on file to calculate BMI.           Physical Examination:   General appearance: alert, well appearing, and in no distress  Mental status: alert, oriented to person, place, and time  Skin: warm & dry   Extremities:  Cardiovascular: normal heart rate noted  Respiratory: normal respiratory effort, no distress  Abdomen: gravid, soft, non-tender  Pelvic: Cervical exam deferred         Fetal Status:          Fetal Surveillance Testing today: BPP 8/8 UAD 97%   Chaperone:     Results for orders placed or performed in visit on 04/27/23 (from the past 24 hour(s))  POC Urinalysis Dipstick OB   Collection Time: 04/27/23  9:37 AM  Result Value Ref Range   Color, UA     Clarity, UA     Glucose, UA Negative Negative   Bilirubin, UA     Ketones, UA Negative    Spec Grav, UA     Blood, UA Negative    pH, UA     POC,PROTEIN,UA Negative Negative, Trace, Small (1+), Moderate (2+), Large (3+), 4+   Urobilinogen, UA     Nitrite, UA Negative    Leukocytes, UA Negative Negative   Appearance     Odor    POCT hemoglobin   Collection Time: 04/27/23  9:37 AM  Result Value Ref Range   Hemoglobin 10.9 (A) 11 - 14.6 g/dL    Assessment &  Plan:  High-risk pregnancy: G2P0101 at [redacted]w[redacted]d with an Estimated Date of Delivery: 06/11/23      ICD-10-CM   1. Supervision of high risk pregnancy in third trimester: ^UAD 97%@[redacted]w[redacted]d   O09.93 POC Urinalysis Dipstick OB    POCT hemoglobin    2. Chronic hypertension: Procardia xl 60 qd, good control  O10.919 POC Urinalysis Dipstick OB    3. Anemia affecting pregnancy in third trimester: S/P Iron infusion, hemoglobin 10.9 today  O99.013 POCT hemoglobin    4. History of cesarean delivery, currently pregnant: repeat 05/25/23@37  weeks  O34.219     5. [redacted] weeks gestation of pregnancy  Z3A.33 POC Urinalysis Dipstick OB    POCT hemoglobin    6. 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   Orders:  Orders Placed This Encounter  Procedures   POC Urinalysis Dipstick OB   POCT hemoglobin     Labs/procedures today: U/S  Treatment Plan:  keep scheduled appts  Reviewed: Preterm labor symptoms and general obstetric precautions including but not limited to vaginal bleeding, contractions, leaking of fluid and fetal movement were reviewed in detail with the patient.  All questions were answered. Does have home bp cuff. Office bp cuff given: yes. Check bp daily, let us know if consistently >150 and/or >95.  Follow-up: No follow-ups on file.   Future Appointments  Date Time Provider Department Center  05/01/2023 11:30 AM [facility] - FTO[facility]YN [place] [facility] None  05/01/2023  1:30 PM Myna Hidalgo, DO [facility] FTOBGYN  05/08/2023  9:30 AM [facility] NURSE [facility]  05/11/2023  8:30 AM [facility] - FTO[facility]YN [place] [facility] None  05/11/2023  9:30 AM Lazaro Arms, MD [facility] FTOBGYN  05/15/2023  9:30 AM [facility] NURSE [facility]  05/18/2023 10:00 AM [facility] - FTOBGYN [place] [facility] None  05/18/2023 10:50 AM Myna Hidalgo, DO [facility] FTOBGYN  05/22/2023  9:15 AM [facility] - FT  IMG 2 [facility] None  05/22/2023  9:30 AM [facility] NURSE [facility]  05/22/2023 10:50 AM Cheral Marker, CNM [facility]TOBGYN    Orders Placed This Encounter  Procedures   POC  Urinalysis Dipstick OB   POCT hemoglobin   Lazaro Arms  Attending Physician for the Center for [facility] 04/27/2023 10:03 AM

## 2023-04-30 ENCOUNTER — Other Ambulatory Visit: Payer: Self-pay | Admitting: Obstetrics & Gynecology

## 2023-04-30 DIAGNOSIS — O10919 Unspecified pre-existing hypertension complicating pregnancy, unspecified trimester: Secondary | ICD-10-CM

## 2023-05-01 ENCOUNTER — Ambulatory Visit (INDEPENDENT_AMBULATORY_CARE_PROVIDER_SITE_OTHER): Payer: Medicaid Other | Admitting: Obstetrics & Gynecology

## 2023-05-01 ENCOUNTER — Other Ambulatory Visit: Payer: Medicaid Other

## 2023-05-01 ENCOUNTER — Ambulatory Visit: Payer: Medicaid Other

## 2023-05-01 VITALS — BP 132/88 | HR 84 | Wt 194.0 lb

## 2023-05-01 DIAGNOSIS — O0993 Supervision of high risk pregnancy, unspecified, third trimester: Secondary | ICD-10-CM

## 2023-05-01 DIAGNOSIS — Z3A34 34 weeks gestation of pregnancy: Secondary | ICD-10-CM

## 2023-05-01 DIAGNOSIS — O10913 Unspecified pre-existing hypertension complicating pregnancy, third trimester: Secondary | ICD-10-CM | POA: Diagnosis not present

## 2023-05-01 DIAGNOSIS — F5089 Other specified eating disorder: Secondary | ICD-10-CM

## 2023-05-01 DIAGNOSIS — O10919 Unspecified pre-existing hypertension complicating pregnancy, unspecified trimester: Secondary | ICD-10-CM

## 2023-05-01 DIAGNOSIS — O34219 Maternal care for unspecified type scar from previous cesarean delivery: Secondary | ICD-10-CM

## 2023-05-01 DIAGNOSIS — O99013 Anemia complicating pregnancy, third trimester: Secondary | ICD-10-CM

## 2023-05-01 NOTE — Progress Notes (Signed)
HIGH-RISK PREGNANCY VISIT Patient name: Donna Conrad MRN [id number]  Date of birth: [birth year] Chief Complaint:   Routine Prenatal Visit (Ultrasound today. )  History of Present Illness:   Donna Conrad is a 27 y.o. G61P0101 female at [redacted]w[redacted]d with an Estimated Date of Delivery: 06/11/23 being seen today for ongoing management of a high-risk pregnancy complicated by:  -Chronic HTN -Prior C-section, scheduled for repeat on  12/20 -HSV2 -Anemia- s/p IV iron- repeat Hgb 11/22- 10.9  Today she reports  that she thinks she is starting to lose her mucus plug.  Denies vaginal odor or itching .   Contractions: Not present. Vag. Bleeding: None.  Movement: Present. denies leaking of fluid.      04/24/2023   10:27 AM 04/10/2023    9:15 AM 03/12/2023   10:41 AM 02/19/2023    9:26 AM 02/01/2023    8:22 AM  Depression screen PHQ 2/9  Decreased Interest 1 1  0 1  Down, Depressed, Hopeless 1 1  1 2   PHQ - 2 Score 2 2  1 3   Altered sleeping 1 1  3  0  Tired, decreased energy 1 1  3 3   Change in appetite 3 3  2 2   Feeling bad or failure about yourself  1 1  2 3   Trouble concentrating 1 1  2 3   Moving slowly or fidgety/restless 1 0  0 0  Suicidal thoughts 0 0  0 0  PHQ-9 Score 10 9  13 14   Difficult doing work/chores Not difficult at all Not difficult at all        Information is confidential and restricted. Go to Review Flowsheets to unlock data.     Current Outpatient Medications  Medication Instructions   aspirin EC 162 mg, Oral, Daily, Take after 12 weeks for prevention of preeclampsia later in pregnancy   Blood Pressure Monitor MISC For regular home bp monitoring during pregnancy   cyclobenzaprine (FLEXERIL) 5 mg, Oral, 3 times daily PRN   ferrous gluconate (FERGON) 324 mg, Oral, Every other day   NIFEdipine (PROCARDIA XL/NIFEDICAL XL) 60 mg, Oral, Daily   omeprazole (PRILOSEC) 20 mg, Oral, Daily, 1 tablet a day   Prenatal Vit-Fe Fumarate-FA (PRENATAL VITAMIN PO) Oral   SODIUM  FLUORIDE 5000 PPM 1.1 % PSTE Take by mouth.   valACYclovir (VALTREX) 500 mg, Oral, 2 times daily     Review of Systems:   Pertinent items are noted in HPI Denies headaches, visual changes, shortness of breath, chest pain, abdominal pain, severe nausea/vomiting, or problems with urination or bowel movements unless otherwise stated above. Pertinent History Reviewed:  Reviewed past medical,surgical, social, obstetrical and family history.  Reviewed problem list, medications and allergies. Physical Assessment:   Vitals:   05/01/23 1218  BP: 132/88  Pulse: 84  Weight: 194 lb (88 kg)  Body mass index is 34.37 kg/m.           Physical Examination:   General appearance: alert, well appearing, and in no distress  Mental status: normal mood, behavior, speech, dress, motor activity, and thought processes  Skin: warm & dry   Extremities:      Cardiovascular: normal heart rate noted  Respiratory: normal respiratory effort, no distress  Abdomen: gravid, soft, non-tender  Pelvic: Cervical exam deferred         Fetal Status:     Movement: Present    Fetal Surveillance Testing today: cephalic,BPP 8/8,posterior placenta gr 3,FHR 132 bpm,AFI 16 cm,elevated UAD with EDF,RI .  74,.79,.75=98%    Chaperone: N/A    No results found for this or any previous visit (from the past 24 hour(s)).   Assessment & Plan:  High-risk pregnancy: G2P0101 at [redacted]w[redacted]d with an Estimated Date of Delivery: 06/11/23   1) Chronic HTN -well controlled with Procardia -continue growth q 4wks -antepartum testing weekly, BPP 8/8 today, elevated dopplers noted  2) Prior C-section- ?Considering TOLAC -scheduled for repeat on 12/20 -pt may reconsider, pt notes her main reason for the repeat was being scared of pushing -reviewed risk/benefit including faster recovery, future VBAC, less risk of injury, hemorrhage and other surgical complications such as placenta accreta with each subsequent C-section -she plans to think about it  and wants to review again at her next visit. 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   Labs/procedures today: BPP  Treatment Plan:  routine OB care and as outlined above  Reviewed: Preterm labor symptoms and general obstetric precautions including but not limited to vaginal bleeding, contractions, leaking of fluid and fetal movement were reviewed in detail with the patient.  All questions were answered. Pt has home bp cuff. Check bp weekly, let us know if >140/90.   Follow-up: No follow-ups on file.   Future Appointments  Date Time Provider Department Center  05/01/2023  1:30 PM Myna Hidalgo, DO [facility] FTOBGYN  05/08/2023  9:30 AM [facility] NURSE [facility]  05/11/2023  8:30 AM [facility] - FTO[facility]YN [place] [facility] None  05/11/2023  9:30 AM Lazaro Arms, MD [facility]TO[facility]YN  05/15/2023  9:30 AM [facility] NURSE [facility]  05/18/2023 10:00 AM [facility] - FTOBGYN [place] [facility] None  05/18/2023 10:50 AM Myna Hidalgo, DO [facility] FTO[facility]YN  05/22/2023  9:15 AM [facility] - FT IMG 2 [facility] None  05/22/2023  9:30 AM [facility] NURSE [facility]  05/22/2023 10:50 AM Cheral Marker, CNM [facility]TO[facility]    No orders of the defined types were placed in this encounter.   Myna Hidalgo, DO Attending Obstetrician & Gynecologist, [facility] for [employer], [facility]

## 2023-05-01 NOTE — Progress Notes (Signed)
[place] 34+1 wks,cephalic,BPP 8/8,posterior placenta gr 3,FHR 132 bpm,AFI 16 cm,elevated UAD with EDF,RI .74,.79,.75=98%

## 2023-05-02 ENCOUNTER — Other Ambulatory Visit: Payer: Medicaid Other

## 2023-05-08 ENCOUNTER — Ambulatory Visit: Payer: Medicaid Other

## 2023-05-08 VITALS — BP 131/81 | HR 89 | Wt 198.0 lb

## 2023-05-08 DIAGNOSIS — O0993 Supervision of high risk pregnancy, unspecified, third trimester: Secondary | ICD-10-CM

## 2023-05-08 DIAGNOSIS — O10013 Pre-existing essential hypertension complicating pregnancy, third trimester: Secondary | ICD-10-CM | POA: Diagnosis not present

## 2023-05-08 DIAGNOSIS — O10919 Unspecified pre-existing hypertension complicating pregnancy, unspecified trimester: Secondary | ICD-10-CM

## 2023-05-08 DIAGNOSIS — Z3A35 35 weeks gestation of pregnancy: Secondary | ICD-10-CM | POA: Diagnosis not present

## 2023-05-08 NOTE — Progress Notes (Signed)
   NURSE VISIT- NST  SUBJECTIVE:  Donna Conrad is a 27 y.o. G80P0101 female at [redacted]w[redacted]d, here for a NST for pregnancy complicated by [facility].  She reports active fetal movement, contractions: none, vaginal bleeding: none, membranes: intact.   OBJECTIVE:  BP 131/81   Pulse 89   Wt 198 lb (89.8 kg)   LMP 09/04/2022 (Exact Date)   BMI 35.07 kg/m   Appears well, no apparent distress  No results found for this or any previous visit (from the past 24 hour(s)).  NST: FHR baseline 125 bpm, Variability: moderate, Accelerations:present, Decelerations:  Absent= Cat 1/reactive Toco: Occasional 6-10 min lasting 60 seconds   ASSESSMENT: G2P0101 at [redacted]w[redacted]d with CHTN NST reactive  PLAN: EFM strip reviewed by Joellyn Haff, CNM, [facility]   Recommendations: keep next appointment as scheduled    [name]  05/08/2023 11:52 AM

## 2023-05-09 ENCOUNTER — Ambulatory Visit: Payer: Medicaid Other | Admitting: Obstetrics & Gynecology

## 2023-05-09 ENCOUNTER — Encounter: Payer: Self-pay | Admitting: Obstetrics & Gynecology

## 2023-05-09 VITALS — BP 135/90 | HR 79 | Wt 198.0 lb

## 2023-05-09 DIAGNOSIS — O10013 Pre-existing essential hypertension complicating pregnancy, third trimester: Secondary | ICD-10-CM

## 2023-05-09 DIAGNOSIS — Z3A35 35 weeks gestation of pregnancy: Secondary | ICD-10-CM

## 2023-05-09 DIAGNOSIS — O10919 Unspecified pre-existing hypertension complicating pregnancy, unspecified trimester: Secondary | ICD-10-CM

## 2023-05-09 DIAGNOSIS — O0993 Supervision of high risk pregnancy, unspecified, third trimester: Secondary | ICD-10-CM

## 2023-05-09 LAB — POCT URINALYSIS DIPSTICK OB
Blood, UA: NEGATIVE
Glucose, UA: NEGATIVE
Ketones, UA: NEGATIVE
Leukocytes, UA: NEGATIVE
Nitrite, UA: NEGATIVE
POC,PROTEIN,UA: NEGATIVE

## 2023-05-09 NOTE — Progress Notes (Signed)
HIGH-RISK PREGNANCY VISIT Patient name: Donna Conrad MRN [id number]  Date of birth: 22-Sep-1995 Chief Complaint:   Contractions (Pelvic pressure/pain, Labor check)  History of Present Illness:   Donna Conrad is a 27 y.o. G28P0101 female at [redacted]w[redacted]d with an Estimated Date of Delivery: 06/11/23 being seen today due to contractions.  She does not think she is in labor, but she does note occasional painful contractions and some pelvic pressure.  Every time that she tells a family member she is in pain they want her to go down to [place] and she does not want to drive that far.  So she presented today to make sure she was not in labor   Contractions: Irregular. Vag. Bleeding: None.  Movement: Present. denies leaking of fluid.      04/24/2023   10:27 AM 04/10/2023    9:15 AM 03/12/2023   10:41 AM 02/19/2023    9:26 AM 02/01/2023    8:22 AM  Depression screen PHQ 2/9  Decreased Interest 1 1  0 1  Down, Depressed, Hopeless 1 1  1 2   PHQ - 2 Score 2 2  1 3   Altered sleeping 1 1  3  0  Tired, decreased energy 1 1  3 3   Change in appetite 3 3  2 2   Feeling bad or failure about yourself  1 1  2 3   Trouble concentrating 1 1  2 3   Moving slowly or fidgety/restless 1 0  0 0  Suicidal thoughts 0 0  0 0  PHQ-9 Score 10 9  13 14   Difficult doing work/chores Not difficult at all Not difficult at all        Information is confidential and restricted. Go to Review Flowsheets to unlock data.     Current Outpatient Medications  Medication Instructions   aspirin EC 162 mg, Oral, Daily, Take after 12 weeks for prevention of preeclampsia later in pregnancy   Blood Pressure Monitor MISC For regular home bp monitoring during pregnancy   cyclobenzaprine (FLEXERIL) 5 mg, Oral, 3 times daily PRN   ferrous gluconate (FERGON) 324 mg, Oral, Every other day   NIFEdipine (PROCARDIA XL/NIFEDICAL XL) 60 mg, Oral, Daily   omeprazole (PRILOSEC) 20 mg, Oral, Daily, 1 tablet a day   Prenatal Vit-Fe Fumarate-FA  (PRENATAL VITAMIN PO) Oral   SODIUM FLUORIDE 5000 PPM 1.1 % PSTE Oral   valACYclovir (VALTREX) 500 mg, Oral, 2 times daily     Review of Systems:   Pertinent items are noted in HPI Denies abnormal vaginal discharge w/ itching/odor/irritation, headaches, visual changes, shortness of breath, chest pain, abdominal pain, severe nausea/vomiting, or problems with urination or bowel movements unless otherwise stated above. Pertinent History Reviewed:  Reviewed past medical,surgical, social, obstetrical and family history.  Reviewed problem list, medications and allergies. Physical Assessment:   Vitals:   05/09/23 1423 05/09/23 1427  BP: (!) 140/95 (!) 135/90  Pulse: 85 79  Weight: 198 lb (89.8 kg)   Body mass index is 35.07 kg/m.           Physical Examination:   General appearance: alert, well appearing, and in no distress  Mental status: normal mood, behavior, speech, dress, motor activity, and thought processes  Skin: warm & dry   Extremities: Edema: None    Cardiovascular: normal heart rate noted  Respiratory: normal respiratory effort, no distress  Abdomen: gravid, soft, non-tender  Pelvic: Cervical exam performed  Dilation: Closed Effacement (%): Thick Station: -3  Fetal Status:  Movement: Present    Fetal Surveillance Testing today: NST   Fetal Monitoring:  Baseline: 140 bpm, Variability: moderate, Accelerations: present, The accelerations are >15 bpm and more than 2 in 20 minutes, and Decelerations: Absent     Final diagnosis:   Reactive NST      Chaperone:  pt declined     Results for orders placed or performed in visit on 05/09/23 (from the past 24 hour(s))  POC Urinalysis Dipstick OB   Collection Time: 05/09/23  2:28 PM  Result Value Ref Range   Color, UA     Clarity, UA     Glucose, UA Negative Negative   Bilirubin, UA     Ketones, UA Negative    Spec Grav, UA     Blood, UA Negative    pH, UA     POC,PROTEIN,UA Negative Negative, Trace, Small (1+),  Moderate (2+), Large (3+), 4+   Urobilinogen, UA     Nitrite, UA Negative    Leukocytes, UA Negative Negative   Appearance     Odor       Assessment & Plan:  High-risk pregnancy: G2P0101 at [redacted]w[redacted]d with an Estimated Date of Delivery: 06/11/23   -reactive NST -cervix closed -f/u as scheduled for appt on Friday  Meds: No orders of the defined types were placed in this encounter.   Labs/procedures today: NSt  Treatment Plan:  as previously scheduled  Reviewed: Preterm labor symptoms and general obstetric precautions including but not limited to vaginal bleeding, contractions, leaking of fluid and fetal movement were reviewed in detail with the patient.  All questions were answered. Pt has home bp cuff. Check bp weekly, let us know if >140/90.   Follow-up: Return for as scheduled.   Future Appointments  Date Time Provider Department Center  05/11/2023  8:30 AM [facility] - [facility]YN [place] [facility] None  05/11/2023  9:30 AM Lazaro Arms, MD [facility]YN  05/15/2023  9:30 AM [facility] NURSE [facility]  05/18/2023 10:00 AM [facility] - [facility]TO[facility]YN [place] [facility] None  05/18/2023 10:50 AM Myna Hidalgo, DO [facility]YN  05/22/2023  9:15 AM [facility] - FT IMG 2 [facility] None  05/22/2023  9:30 AM [facility] NURSE [facility]  05/22/2023 10:50 AM Cheral Marker, CNM [facility]    Orders Placed This Encounter  Procedures   POC Urinalysis Dipstick OB    Myna Hidalgo, DO Attending Obstetrician & Gynecologist, [facility] for [employer], [facility]

## 2023-05-10 ENCOUNTER — Other Ambulatory Visit: Payer: Self-pay | Admitting: Obstetrics & Gynecology

## 2023-05-10 DIAGNOSIS — O10919 Unspecified pre-existing hypertension complicating pregnancy, unspecified trimester: Secondary | ICD-10-CM

## 2023-05-11 ENCOUNTER — Encounter: Payer: Self-pay | Admitting: Obstetrics & Gynecology

## 2023-05-11 ENCOUNTER — Other Ambulatory Visit (HOSPITAL_COMMUNITY)
Admission: RE | Admit: 2023-05-11 | Discharge: 2023-05-11 | Disposition: A | Discharge status: Home / Self Care | Payer: Medicaid Other | Source: Ambulatory Visit | Attending: Obstetrics & Gynecology | Admitting: Obstetrics & Gynecology

## 2023-05-11 ENCOUNTER — Encounter (HOSPITAL_COMMUNITY): Payer: Self-pay

## 2023-05-11 ENCOUNTER — Ambulatory Visit: Payer: Medicaid Other

## 2023-05-11 ENCOUNTER — Ambulatory Visit (INDEPENDENT_AMBULATORY_CARE_PROVIDER_SITE_OTHER): Payer: Medicaid Other | Admitting: Obstetrics & Gynecology

## 2023-05-11 VITALS — BP 122/85 | HR 84 | Wt 199.0 lb

## 2023-05-11 DIAGNOSIS — O0993 Supervision of high risk pregnancy, unspecified, third trimester: Secondary | ICD-10-CM | POA: Insufficient documentation

## 2023-05-11 DIAGNOSIS — O10919 Unspecified pre-existing hypertension complicating pregnancy, unspecified trimester: Secondary | ICD-10-CM

## 2023-05-11 DIAGNOSIS — O34219 Maternal care for unspecified type scar from previous cesarean delivery: Secondary | ICD-10-CM

## 2023-05-11 DIAGNOSIS — O10913 Unspecified pre-existing hypertension complicating pregnancy, third trimester: Secondary | ICD-10-CM

## 2023-05-11 DIAGNOSIS — Z3A35 35 weeks gestation of pregnancy: Secondary | ICD-10-CM | POA: Insufficient documentation

## 2023-05-11 DIAGNOSIS — Z762 Encounter for health supervision and care of other healthy infant and child: Secondary | ICD-10-CM

## 2023-05-11 DIAGNOSIS — F5089 Other specified eating disorder: Secondary | ICD-10-CM

## 2023-05-11 NOTE — Patient Instructions (Signed)
[name]  05/11/2023   Your procedure is scheduled on:  05/25/2023  Arrive at 0745 at Entrance C on [address] at [facility]  and [employer]. You are invited to use the FREE valet parking or use the Visitor's parking deck.  Pick up the phone at the desk and dial [phone].  Call this number if you have problems the morning of surgery: [phone]  Remember:   Do not eat food:(After Midnight) Desps de medianoche.  You may drink clear liquids until arrival at __0745___.  Clear liquids means a liquid you can see thru.  It can have color such as Cola or Kool aid.  Tea is OK and coffee as long as no milk or creamer of any kind.  Take these medicines the morning of surgery with A SIP OF WATER:  none   Do not wear jewelry, make-up or nail polish.  Do not wear lotions, powders, or perfumes. Do not wear deodorant.  Do not shave 48 hours prior to surgery.  Do not bring valuables to the hospital.  [facility] is not   responsible for any belongings or valuables brought to the hospital.  Contacts, dentures or bridgework may not be worn into surgery.  Leave suitcase in the car. After surgery it may be brought to your room.  For patients admitted to the hospital, checkout time is 11:00 AM the day of              discharge.      Please read over the following fact sheets that you were given:     Preparing for Surgery

## 2023-05-11 NOTE — Progress Notes (Signed)
HIGH-RISK PREGNANCY VISIT Patient name: Donna Conrad MRN [id number]  Date of birth: 1996/04/23 Chief Complaint:   Routine Prenatal Visit  History of Present Illness:   Donna Conrad is a 27 y.o. G71P0101 female at [redacted]w[redacted]d with an Estimated Date of Delivery: 06/11/23 being seen today for ongoing management of a high-risk pregnancy complicated by cHTN on procardia xl 60 every day, previous C section.    Today she reports no complaints. Contractions: Irregular. Vag. Bleeding: None.  Movement: Present. denies leaking of fluid.      04/24/2023   10:27 AM 04/10/2023    9:15 AM 03/12/2023   10:41 AM 02/19/2023    9:26 AM 02/01/2023    8:22 AM  Depression screen PHQ 2/9  Decreased Interest 1 1  0 1  Down, Depressed, Hopeless 1 1  1 2   PHQ - 2 Score 2 2  1 3   Altered sleeping 1 1  3  0  Tired, decreased energy 1 1  3 3   Change in appetite 3 3  2 2   Feeling bad or failure about yourself  1 1  2 3   Trouble concentrating 1 1  2 3   Moving slowly or fidgety/restless 1 0  0 0  Suicidal thoughts 0 0  0 0  PHQ-9 Score 10 9  13 14   Difficult doing work/chores Not difficult at all Not difficult at all        Information is confidential and restricted. Go to Review Flowsheets to unlock data.        02/19/2023    9:28 AM 02/01/2023    8:25 AM 12/01/2022    9:10 AM  GAD 7 : Generalized Anxiety Score  Nervous, Anxious, on Edge 3 3 3   Control/stop worrying 2 3 3   Worry too much - different things 2 3 3   Trouble relaxing 2 3 3   Restless 1 0 0  Easily annoyed or irritable 3 3 3   Afraid - awful might happen 3 3 3   Total GAD 7 Score 16 18 18      Review of Systems:   Pertinent items are noted in HPI Denies abnormal vaginal discharge w/ itching/odor/irritation, headaches, visual changes, shortness of breath, chest pain, abdominal pain, severe nausea/vomiting, or problems with urination or bowel movements unless otherwise stated above. Pertinent History Reviewed:  Reviewed past medical,surgical,  social, obstetrical and family history.  Reviewed problem list, medications and allergies. Physical Assessment:   Vitals:   05/11/23 0931  BP: 122/85  Pulse: 84  Weight: 199 lb (90.3 kg)  Body mass index is 35.25 kg/m.           Physical Examination:   General appearance: alert, well appearing, and in no distress  Mental status: alert, oriented to person, place, and time  Skin: warm & dry   Extremities: Edema: None    Cardiovascular: normal heart rate noted  Respiratory: normal respiratory effort, no distress  Abdomen: gravid, soft, non-tender  Pelvic: Cervical exam deferred         Fetal Status:     Movement: Present    Fetal Surveillance Testing today: BPP 8/8 UAD 45%   Chaperone: Peggy Dones    No results found for this or any previous visit (from the past 24 hour(s)).  Assessment & Plan:  High-risk pregnancy: G2P0101 at [redacted]w[redacted]d with an Estimated Date of Delivery: 06/11/23      ICD-10-CM   1. Supervision of high risk pregnancy in third trimester  O09.93  2. Chronic hypertension affecting pregnancy  O10.919     3. [redacted] weeks gestation of pregnancy  Z3A.35     4. History of cesarean delivery, currently pregnant: repeat 05/25/23@37  weeks  O34.219         Meds: No orders of the defined types were placed in this encounter.   Orders: No orders of the defined types were placed in this encounter.    Labs/procedures today: U/S  Treatment Plan:  twice weekly surveillance, repeat section 05/25/23  Reviewed: Preterm labor symptoms and general obstetric precautions including but not limited to vaginal bleeding, contractions, leaking of fluid and fetal movement were reviewed in detail with the patient.  All questions were answered. Does have home bp cuff. Office bp cuff given: not applicable. Check bp daily, let us know if consistently >150 and/or >95.  Follow-up: No follow-ups on file.   Future Appointments  Date Time Provider Department Center  05/15/2023  9:30 AM  [facility] NURSE [facility]  05/18/2023 10:00 AM [facility] [place] [facility] None  05/18/2023 10:50 AM Myna Hidalgo, DO [facility]  05/22/2023  9:15 AM [facility] 2 [facility] None  05/22/2023  9:30 AM [facility] NURSE [facility]  05/22/2023 10:50 AM Cheral Marker, CNM [facility]    No orders of the defined types were placed in this encounter.  Lazaro Arms  Attending Physician for the Center for [facility] 05/11/2023 10:11 AM

## 2023-05-11 NOTE — Progress Notes (Signed)
[place] 35+4 wks,cephalic,BPP 8/8,FHR 133 bpm,posterior placenta gr 3,AFI 19 cm,RI .55,.57,.58=45%

## 2023-05-11 NOTE — Addendum Note (Signed)
Addended by: Moss Mc on: 05/11/2023 10:18 AM   Modules accepted: Orders

## 2023-05-14 LAB — CERVICOVAGINAL ANCILLARY ONLY
Chlamydia: NEGATIVE
Comment: NEGATIVE
Comment: NORMAL
Neisseria Gonorrhea: NEGATIVE

## 2023-05-14 LAB — CULTURE, BETA STREP (GROUP B ONLY): Strep Gp B Culture: POSITIVE — AB

## 2023-05-15 ENCOUNTER — Ambulatory Visit (INDEPENDENT_AMBULATORY_CARE_PROVIDER_SITE_OTHER): Payer: Medicaid Other | Admitting: *Deleted

## 2023-05-15 VITALS — BP 127/85 | HR 81 | Wt 200.5 lb

## 2023-05-15 DIAGNOSIS — Z3A36 36 weeks gestation of pregnancy: Secondary | ICD-10-CM

## 2023-05-15 DIAGNOSIS — O0993 Supervision of high risk pregnancy, unspecified, third trimester: Secondary | ICD-10-CM | POA: Diagnosis not present

## 2023-05-15 DIAGNOSIS — O10919 Unspecified pre-existing hypertension complicating pregnancy, unspecified trimester: Secondary | ICD-10-CM | POA: Diagnosis not present

## 2023-05-15 LAB — POCT URINALYSIS DIPSTICK OB
Blood, UA: NEGATIVE
Glucose, UA: NEGATIVE
Ketones, UA: NEGATIVE
Leukocytes, UA: NEGATIVE
Nitrite, UA: NEGATIVE
POC,PROTEIN,UA: NEGATIVE

## 2023-05-15 NOTE — Progress Notes (Signed)
   NURSE VISIT- NST  SUBJECTIVE:  Donna Conrad is a 27 y.o. G69P0101 female at [redacted]w[redacted]d, here for a NST for pregnancy complicated by [facility].  She reports active fetal movement, contractions: occasional, vaginal bleeding: none, membranes: intact.   OBJECTIVE:  BP 127/85   Pulse 81   Wt 200 lb 8 oz (90.9 kg)   LMP 09/04/2022 (Exact Date)   BMI 35.52 kg/m   Appears well, no apparent distress  Results for orders placed or performed in visit on 05/15/23 (from the past 24 hour(s))  POC Urinalysis Dipstick OB   Collection Time: 05/15/23 10:08 AM  Result Value Ref Range   Color, UA     Clarity, UA     Glucose, UA Negative Negative   Bilirubin, UA     Ketones, UA neg    Spec Grav, UA     Blood, UA neg    pH, UA     POC,PROTEIN,UA Negative Negative, Trace, Small (1+), Moderate (2+), Large (3+), 4+   Urobilinogen, UA     Nitrite, UA neg    Leukocytes, UA Negative Negative   Appearance     Odor      NST: FHR baseline 125 bpm, Variability: moderate, Accelerations:present, Decelerations:  Absent= Cat 1/reactive Toco: none   ASSESSMENT: G2P0101 at [redacted]w[redacted]d with CHTN NST reactive  PLAN: EFM strip reviewed by Dr. Charlotta Newton   Recommendations: keep next appointment as scheduled    Jobe Marker  05/15/2023 11:29 AM

## 2023-05-16 ENCOUNTER — Inpatient Hospital Stay (HOSPITAL_COMMUNITY)
Admission: AD | Admit: 2023-05-16 | Discharge: 2023-05-16 | Disposition: A | Discharge status: Home / Self Care | Payer: Medicaid Other | Attending: Obstetrics and Gynecology | Admitting: Obstetrics and Gynecology

## 2023-05-16 ENCOUNTER — Encounter (HOSPITAL_COMMUNITY): Payer: Self-pay | Admitting: Obstetrics & Gynecology

## 2023-05-16 DIAGNOSIS — O10013 Pre-existing essential hypertension complicating pregnancy, third trimester: Secondary | ICD-10-CM | POA: Diagnosis not present

## 2023-05-16 DIAGNOSIS — R519 Headache, unspecified: Secondary | ICD-10-CM | POA: Insufficient documentation

## 2023-05-16 DIAGNOSIS — F5089 Other specified eating disorder: Secondary | ICD-10-CM

## 2023-05-16 DIAGNOSIS — O10913 Unspecified pre-existing hypertension complicating pregnancy, third trimester: Secondary | ICD-10-CM | POA: Diagnosis not present

## 2023-05-16 DIAGNOSIS — Z3A36 36 weeks gestation of pregnancy: Secondary | ICD-10-CM

## 2023-05-16 DIAGNOSIS — T461X6A Underdosing of calcium-channel blockers, initial encounter: Secondary | ICD-10-CM | POA: Diagnosis not present

## 2023-05-16 DIAGNOSIS — Z91128 Patient's intentional underdosing of medication regimen for other reason: Secondary | ICD-10-CM | POA: Insufficient documentation

## 2023-05-16 DIAGNOSIS — I10 Essential (primary) hypertension: Secondary | ICD-10-CM | POA: Diagnosis present

## 2023-05-16 DIAGNOSIS — O10919 Unspecified pre-existing hypertension complicating pregnancy, unspecified trimester: Secondary | ICD-10-CM

## 2023-05-16 DIAGNOSIS — O34219 Maternal care for unspecified type scar from previous cesarean delivery: Secondary | ICD-10-CM

## 2023-05-16 DIAGNOSIS — O0993 Supervision of high risk pregnancy, unspecified, third trimester: Secondary | ICD-10-CM

## 2023-05-16 LAB — COMPREHENSIVE METABOLIC PANEL
ALT: 11 U/L (ref 0–44)
AST: 19 U/L (ref 15–41)
Albumin: 2.7 g/dL — ABNORMAL LOW (ref 3.5–5.0)
Alkaline Phosphatase: 89 U/L (ref 38–126)
Anion gap: 9 (ref 5–15)
BUN: 5 mg/dL — ABNORMAL LOW (ref 6–20)
CO2: 18 mmol/L — ABNORMAL LOW (ref 22–32)
Calcium: 8.7 mg/dL — ABNORMAL LOW (ref 8.9–10.3)
Chloride: 107 mmol/L (ref 98–111)
Creatinine, Ser: 0.52 mg/dL (ref 0.44–1.00)
GFR, Estimated: >60 mL/min (ref >60)
Glucose, Bld: 85 mg/dL (ref 70–99)
Potassium: 3.6 mmol/L (ref 3.5–5.1)
Sodium: 134 mmol/L — ABNORMAL LOW (ref 135–145)
Total Bilirubin: 0.3 mg/dL (ref <1.2)
Total Protein: 5.7 g/dL — ABNORMAL LOW (ref 6.5–8.1)

## 2023-05-16 LAB — CBC
HCT: 29.1 % — ABNORMAL LOW (ref 36.0–46.0)
Hemoglobin: 9.7 g/dL — ABNORMAL LOW (ref 12.0–15.0)
MCH: 29.1 pg (ref 26.0–34.0)
MCHC: 33.3 g/dL (ref 30.0–36.0)
MCV: 87.4 fL (ref 80.0–100.0)
Platelets: 221 10*3/uL (ref 150–400)
RBC: 3.33 MIL/uL — ABNORMAL LOW (ref 3.87–5.11)
RDW: 19.2 % — ABNORMAL HIGH (ref 11.5–15.5)
WBC: 6.2 10*3/uL (ref 4.0–10.5)
nRBC: 0 % (ref 0.0–0.2)

## 2023-05-16 LAB — URINALYSIS, ROUTINE W REFLEX MICROSCOPIC
Bilirubin Urine: NEGATIVE
Glucose, UA: NEGATIVE mg/dL
Hgb urine dipstick: NEGATIVE
Ketones, ur: NEGATIVE mg/dL
Leukocytes,Ua: NEGATIVE
Nitrite: NEGATIVE
Protein, ur: NEGATIVE mg/dL
Specific Gravity, Urine: 1.012 (ref 1.005–1.030)
pH: 6 (ref 5.0–8.0)

## 2023-05-16 LAB — PROTEIN / CREATININE RATIO, URINE
Creatinine, Urine: 114 mg/dL
Protein Creatinine Ratio: 0.11 mg/mg{creat} (ref 0.00–0.15)
Total Protein, Urine: 12 mg/dL

## 2023-05-16 MED ORDER — CYCLOBENZAPRINE HCL 5 MG PO TABS
5.0000 mg | ORAL_TABLET | Freq: Once | ORAL | Status: AC
  Administered 2023-05-16: 5 mg via ORAL
  Filled 2023-05-16: qty 1

## 2023-05-16 MED ORDER — FLUCONAZOLE 150 MG PO TABS
150.0000 mg | ORAL_TABLET | Freq: Once | ORAL | Status: AC
  Administered 2023-05-16: 150 mg via ORAL
  Filled 2023-05-16: qty 1

## 2023-05-16 MED ORDER — CAFFEINE 200 MG PO TABS
100.0000 mg | ORAL_TABLET | Freq: Once | ORAL | Status: AC
  Administered 2023-05-16: 100 mg via ORAL
  Filled 2023-05-16: qty 1

## 2023-05-16 NOTE — MAU Note (Signed)
Pt says since 0300- has H/A - ( 10/10)  took 2 tabs of reg Tyl Has not taken BP Procardia x3 days - but went to [place] today and BP was good . Then at 0320 BP- 140/109 so she took Procardia  At 0400- she called EMS- BP - 145/109  139/89  Now- H/A - 4/10- left temple.  No vision changes No epigastric pain . Sometimes UC's  VE 1 week ago- closed.  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

## 2023-05-16 NOTE — MAU Provider Note (Addendum)
Chief Complaint:  Headache   Event Date/Time   First Provider Initiated Contact with Patient 05/16/23 0612     HPI: Donna Conrad is a 27 y.o. G2P0101 at 49w2dwho presents to maternity admissions reporting elevated blood pressure and headache unrelieved by Tylenol  Stopped Procardia 3 days ago. DId take one this morning at 3. . She reports good fetal movement, denies LOF, vaginal bleeding, n/v, or fever/chills.    Headache  This is a new problem. The current episode started today. The problem occurs constantly. The problem has been unchanged. The quality of the pain is described as aching. The pain is at a severity of 4/10. Pertinent negatives include no abdominal pain, back pain, fever or nausea.   RN Note: Pt says since 0300- has H/A - ( 10/10)  took 2 tabs of reg Tyl Has not taken BP Procardia x3 days - but went to [facility] today and BP was good . Then at 0320 BP- 140/109 so she took Procardia  At 0400- she called EMS- BP - 145/109  139/89  Now- H/A - 4/10- left temple.  No vision changes No epigastric pain . Sometimes UC's  VE 1 week ago- closed  Past Medical History: Past Medical History:  Diagnosis Date   HSV infection    Pre-eclampsia     Past obstetric history: OB History  Gravida Para Term Preterm AB Living  2 1   1   1   SAB IAB Ectopic Multiple Live Births        0 1    # Outcome Date GA Lbr Len/2nd Weight Sex Type Anes PTL Lv  2 Current           1 Preterm 11/19/14 [redacted]w[redacted]d  1520 g F CS-LTranv Spinal  LIV     Complications: Severe pre-eclampsia, HELLP syndrome    Past Surgical History: Past Surgical History:  Procedure Laterality Date   CESAREAN SECTION N/A 11/19/2014   Procedure: CESAREAN SECTION;  Surgeon: Sherian Rein, MD;  Location: [facility];  Service: Obstetrics;  Laterality: N/A;   WISDOM TOOTH EXTRACTION      Family History: Family History  Problem Relation Age of Onset   Asthma Mother    Diabetes Mother    Hypertension Mother     Miscarriages / [place] Mother    Stroke Mother    Bipolar disorder Mother    Asthma Father    Depression Father    Stroke Father    Diabetes Sister    Mental illness Sister    Miscarriages / Stillbirths Sister    Bipolar disorder Sister    Diabetes Brother    Asthma Daughter    Diabetes Maternal Aunt     Social History: Social History   Tobacco Use   Smoking status: Former    Types: Cigarettes   Smokeless tobacco: Never   Tobacco comments:    Pt reports smoking only some days, but has stopped since finding out she was pregnant.   Vaping Use   Vaping status: Never Used  Substance Use Topics   Alcohol use: Not Currently    Comment: Last drink in February 2024.  1 person a bottle of gray goose vodka at a time   Drug use: Yes    Types: Marijuana    Comment: Couple hits of a joint last use in early October 2024    Allergies: No Known Allergies  Meds:  Facility-Administered Medications Prior to Admission  Medication Dose Route Frequency Provider Last Rate Last  Admin   albuterol (PROVENTIL) (2.5 MG/3ML) 0.083% nebulizer solution 2.5 mg  2.5 mg Nebulization Once PRN Cresenzo-Dishmon, Scarlette Calico, CNM       diphenhydrAMINE (BENADRYL) injection 25 mg  25 mg Intravenous Once PRN Cresenzo-Dishmon, Scarlette Calico, CNM       EPINEPHrine (ADRENALIN) 0.3 mg  0.3 mg Intramuscular Once PRN Cresenzo-Dishmon, Scarlette Calico, CNM       methylPREDNISolone sodium succinate (SOLU-MEDROL) 125 mg in sodium chloride 0.9 % 50 mL IVPB  125 mg Intravenous Once PRN Cresenzo-Dishmon, Scarlette Calico, CNM       sodium chloride 0.9 % bolus 500 mL  500 mL Intravenous Once PRN Cresenzo-Dishmon, Scarlette Calico, CNM       sodium chloride flush (NS) 0.9 % injection 3 mL  3 mL Intravenous PRN Cresenzo-Dishmon, Scarlette Calico, CNM       Medications Prior to Admission  Medication Sig Dispense Refill Last Dose   aspirin EC 81 MG tablet Take 2 tablets (162 mg total) by mouth daily. Take after 12 weeks for prevention of preeclampsia later in  pregnancy (Patient taking differently: Take 81 mg by mouth daily. Take after 12 weeks for prevention of preeclampsia later in pregnancy) 300 tablet 2 05/16/2023 at 0300   NIFEdipine (PROCARDIA XL/NIFEDICAL XL) 60 MG 24 hr tablet Take 1 tablet (60 mg total) by mouth daily. 30 tablet 6 05/16/2023 at 0320   omeprazole (PRILOSEC) 20 MG capsule Take 1 capsule (20 mg total) by mouth daily. 1 tablet a day 30 capsule 6 05/15/2023 at 2100   Prenatal Vit-Fe Fumarate-FA (PRENATAL VITAMIN PO) Take by mouth.   05/15/2023   valACYclovir (VALTREX) 500 MG tablet Take 1 tablet (500 mg total) by mouth 2 (two) times daily. 60 tablet 6 05/15/2023   Blood Pressure Monitor MISC For regular home bp monitoring during pregnancy 1 each 0    ferrous gluconate (FERGON) 324 MG tablet Take 1 tablet (324 mg total) by mouth every other day. (Patient not taking: Reported on 04/24/2023) 60 tablet 3 is getting iron infusions   SODIUM FLUORIDE 5000 PPM 1.1 % PSTE Take by mouth.       I have reviewed patient's Past Medical Hx, Surgical Hx, Family Hx, Social Hx, medications and allergies.   ROS:  Review of Systems  Constitutional:  Negative for fever.  Gastrointestinal:  Negative for abdominal pain and nausea.  Musculoskeletal:  Negative for back pain.  Neurological:  Positive for headaches.   Other systems negative  Physical Exam  Patient Vitals for the past 24 hrs:  BP Temp Temp src Pulse Resp SpO2 Height Weight  05/16/23 0700 128/82 -- -- 71 -- 100 % -- --  05/16/23 0645 120/80 -- -- 74 -- 99 % -- --  05/16/23 0631 123/77 -- -- 79 -- -- -- --  05/16/23 0615 (!) 124/95 -- -- 81 -- 100 % -- --  05/16/23 0555 131/89 97.9 F (36.6 C) Oral 83 16 -- 5\' 3"  (1.6 m) 92.4 kg   Vitals:   05/16/23 0555 05/16/23 0615 05/16/23 0631 05/16/23 0645  BP: 131/89 (!) 124/95 123/77 120/80   05/16/23 0700  BP: 128/82    Constitutional: Well-developed, well-nourished female in no acute distress.  Cardiovascular: normal rate and  rhythm Respiratory: normal effort, clear to auscultation bilaterally GI: Abd soft, non-tender, gravid appropriate for gestational age.   No rebound or guarding. MS: Extremities nontender, no edema, normal ROM Neurologic: Alert and oriented x 4.  GU: Neg CVAT.  FHT:  Baseline 130 , moderate variability, accelerations present, no decelerations  Contractions:  Irregular     Labs: Results for orders placed or performed during the hospital encounter of 05/16/23 (from the past 24 hour(s))  Urinalysis, Routine w reflex microscopic -Urine, Clean Catch     Status: None   Collection Time: 05/16/23  6:22 AM  Result Value Ref Range   Color, Urine YELLOW YELLOW   APPearance CLEAR CLEAR   Specific Gravity, Urine 1.012 1.005 - 1.030   pH 6.0 5.0 - 8.0   Glucose, UA NEGATIVE NEGATIVE mg/dL   Hgb urine dipstick NEGATIVE NEGATIVE   Bilirubin Urine NEGATIVE NEGATIVE   Ketones, ur NEGATIVE NEGATIVE mg/dL   Protein, ur NEGATIVE NEGATIVE mg/dL   Nitrite NEGATIVE NEGATIVE   Leukocytes,Ua NEGATIVE NEGATIVE  Protein / creatinine ratio, urine     Status: None   Collection Time: 05/16/23  6:23 AM  Result Value Ref Range   Creatinine, Urine 114 mg/dL   Total Protein, Urine 12 mg/dL   Protein Creatinine Ratio 0.11 0.00 - 0.15 mg/mg[Cre]  CBC     Status: Abnormal   Collection Time: 05/16/23  6:55 AM  Result Value Ref Range   WBC 6.2 4.0 - 10.5 K/uL   RBC 3.33 (L) 3.87 - 5.11 MIL/uL   Hemoglobin 9.7 (L) 12.0 - 15.0 g/dL   HCT 09.8 (L) 11.9 - 14.7 %   MCV 87.4 80.0 - 100.0 fL   MCH 29.1 26.0 - 34.0 pg   MCHC 33.3 30.0 - 36.0 g/dL   RDW 82.9 (H) 56.2 - 13.0 %   Platelets 221 150 - 400 K/uL   nRBC 0.0 0.0 - 0.2 %  Comprehensive metabolic panel     Status: Abnormal   Collection Time: 05/16/23  6:55 AM  Result Value Ref Range   Sodium 134 (L) 135 - 145 mmol/L   Potassium 3.6 3.5 - 5.1 mmol/L   Chloride 107 98 - 111 mmol/L   CO2 18 (L) 22 - 32 mmol/L   Glucose, Bld 85 70 - 99 mg/dL   BUN 5 (L) 6 - 20  mg/dL   Creatinine, Ser 8.65 0.44 - 1.00 mg/dL   Calcium 8.7 (L) 8.9 - 10.3 mg/dL   Total Protein 5.7 (L) 6.5 - 8.1 g/dL   Albumin 2.7 (L) 3.5 - 5.0 g/dL   AST 19 15 - 41 U/L   ALT 11 0 - 44 U/L   Alkaline Phosphatase 89 38 - 126 U/L   Total Bilirubin 0.3 <1.2 mg/dL   GFR, Estimated >78 >46 mL/min   Anion gap 9 5 - 15    O/Positive/-- (07/29 1045)  Imaging:    MAU Course/MDM: I have reviewed the triage vital signs and the nursing notes.   Pertinent labs & imaging results that were available during my care of the patient were reviewed by me and considered in my medical decision making (see chart for details).      I have reviewed her medical records including past results, notes and treatments.   I have ordered labs and reviewed results.  NST reviewed, reassuring  Treatments in MAU included Caffeine and Flexeril which relieved headache.  BPs are mostly within normal limits, likely resulting from her taking her med at 3am..    Assessment: Single IUP at [redacted]w[redacted]d Chronic hypertension exacerbation due to poor medication intake Headache, relieved by meds  Plan: Care turned over to oncoming provider.  Wynelle Bourgeois CNM, MSN Certified Nurse-Midwife 05/16/2023 8:04 AM   Reassessment: Results for orders placed or performed during the hospital encounter  of 05/16/23 (from the past 24 hour(s))  Urinalysis, Routine w reflex microscopic -Urine, Clean Catch     Status: None   Collection Time: 05/16/23  6:22 AM  Result Value Ref Range   Color, Urine YELLOW YELLOW   APPearance CLEAR CLEAR   Specific Gravity, Urine 1.012 1.005 - 1.030   pH 6.0 5.0 - 8.0   Glucose, UA NEGATIVE NEGATIVE mg/dL   Hgb urine dipstick NEGATIVE NEGATIVE   Bilirubin Urine NEGATIVE NEGATIVE   Ketones, ur NEGATIVE NEGATIVE mg/dL   Protein, ur NEGATIVE NEGATIVE mg/dL   Nitrite NEGATIVE NEGATIVE   Leukocytes,Ua NEGATIVE NEGATIVE  Protein / creatinine ratio, urine     Status: None   Collection Time: 05/16/23   6:23 AM  Result Value Ref Range   Creatinine, Urine 114 mg/dL   Total Protein, Urine 12 mg/dL   Protein Creatinine Ratio 0.11 0.00 - 0.15 mg/mg[Cre]  CBC     Status: Abnormal   Collection Time: 05/16/23  6:55 AM  Result Value Ref Range   WBC 6.2 4.0 - 10.5 K/uL   RBC 3.33 (L) 3.87 - 5.11 MIL/uL   Hemoglobin 9.7 (L) 12.0 - 15.0 g/dL   HCT 16.1 (L) 09.6 - 04.5 %   MCV 87.4 80.0 - 100.0 fL   MCH 29.1 26.0 - 34.0 pg   MCHC 33.3 30.0 - 36.0 g/dL   RDW 40.9 (H) 81.1 - 91.4 %   Platelets 221 150 - 400 K/uL   nRBC 0.0 0.0 - 0.2 %  Comprehensive metabolic panel     Status: Abnormal   Collection Time: 05/16/23  6:55 AM  Result Value Ref Range   Sodium 134 (L) 135 - 145 mmol/L   Potassium 3.6 3.5 - 5.1 mmol/L   Chloride 107 98 - 111 mmol/L   CO2 18 (L) 22 - 32 mmol/L   Glucose, Bld 85 70 - 99 mg/dL   BUN 5 (L) 6 - 20 mg/dL   Creatinine, Ser 7.82 0.44 - 1.00 mg/dL   Calcium 8.7 (L) 8.9 - 10.3 mg/dL   Total Protein 5.7 (L) 6.5 - 8.1 g/dL   Albumin 2.7 (L) 3.5 - 5.0 g/dL   AST 19 15 - 41 U/L   ALT 11 0 - 44 U/L   Alkaline Phosphatase 89 38 - 126 U/L   Total Bilirubin 0.3 <1.2 mg/dL   GFR, Estimated >95 >62 mL/min   Anion gap 9 5 - 15   Labs wnl Pt asymptomatic     05/16/2023    7:00 AM 05/16/2023    6:45 AM 05/16/2023    6:31 AM  Vitals with BMI  Systolic 128 120 130  Diastolic 82 80 77  Pulse 71 74 79   Normotensive  Plan: Discharge home  Continue prenatal care as scheduled Preterm labor, preeclampsia and return precautions provided

## 2023-05-17 ENCOUNTER — Other Ambulatory Visit: Payer: Self-pay | Admitting: Obstetrics & Gynecology

## 2023-05-17 DIAGNOSIS — O10919 Unspecified pre-existing hypertension complicating pregnancy, unspecified trimester: Secondary | ICD-10-CM

## 2023-05-18 ENCOUNTER — Encounter: Payer: Self-pay | Admitting: Obstetrics & Gynecology

## 2023-05-18 ENCOUNTER — Ambulatory Visit: Payer: Medicaid Other

## 2023-05-18 ENCOUNTER — Ambulatory Visit (INDEPENDENT_AMBULATORY_CARE_PROVIDER_SITE_OTHER): Payer: Medicaid Other | Admitting: Obstetrics & Gynecology

## 2023-05-18 VITALS — BP 137/88 | HR 77 | Wt 197.6 lb

## 2023-05-18 DIAGNOSIS — O10919 Unspecified pre-existing hypertension complicating pregnancy, unspecified trimester: Secondary | ICD-10-CM

## 2023-05-18 DIAGNOSIS — Z3A36 36 weeks gestation of pregnancy: Secondary | ICD-10-CM

## 2023-05-18 DIAGNOSIS — O10013 Pre-existing essential hypertension complicating pregnancy, third trimester: Secondary | ICD-10-CM

## 2023-05-18 DIAGNOSIS — A6009 Herpesviral infection of other urogenital tract: Secondary | ICD-10-CM

## 2023-05-18 DIAGNOSIS — O0993 Supervision of high risk pregnancy, unspecified, third trimester: Secondary | ICD-10-CM

## 2023-05-18 DIAGNOSIS — F5089 Other specified eating disorder: Secondary | ICD-10-CM

## 2023-05-18 DIAGNOSIS — O10913 Unspecified pre-existing hypertension complicating pregnancy, third trimester: Secondary | ICD-10-CM | POA: Diagnosis not present

## 2023-05-18 DIAGNOSIS — O34219 Maternal care for unspecified type scar from previous cesarean delivery: Secondary | ICD-10-CM

## 2023-05-18 DIAGNOSIS — O98319 Other infections with a predominantly sexual mode of transmission complicating pregnancy, unspecified trimester: Secondary | ICD-10-CM

## 2023-05-18 NOTE — Progress Notes (Signed)
HIGH-RISK PREGNANCY VISIT Patient name: Donna Conrad MRN [id number]  Date of birth: 1995-11-04 Chief Complaint:   Routine Prenatal Visit  History of Present Illness:   Donna Conrad is a 27 y.o. G60P0101 female at [redacted]w[redacted]d with an Estimated Date of Delivery: 06/11/23 being seen today for ongoing management of a high-risk pregnancy complicated by:  -Chronic HTN- on Nifedipine 60mg  daily Asymptomatic -Prior C-section -HSV asymptomatic -GBS positive -Anemia- s/p IV iron, current Hgb 9.7  Today she reports no complaints.   Contractions: Irritability. Vag. Bleeding: None.  Movement: Present. denies leaking of fluid.      04/24/2023   10:27 AM 04/10/2023    9:15 AM 03/12/2023   10:41 AM 02/19/2023    9:26 AM 02/01/2023    8:22 AM  Depression screen PHQ 2/9  Decreased Interest 1 1  0 1  Down, Depressed, Hopeless 1 1  1 2   PHQ - 2 Score 2 2  1 3   Altered sleeping 1 1  3  0  Tired, decreased energy 1 1  3 3   Change in appetite 3 3  2 2   Feeling bad or failure about yourself  1 1  2 3   Trouble concentrating 1 1  2 3   Moving slowly or fidgety/restless 1 0  0 0  Suicidal thoughts 0 0  0 0  PHQ-9 Score 10 9  13 14   Difficult doing work/chores Not difficult at all Not difficult at all        Information is confidential and restricted. Go to Review Flowsheets to unlock data.     Current Outpatient Medications  Medication Instructions   aspirin EC 162 mg, Oral, Daily, Take after 12 weeks for prevention of preeclampsia later in pregnancy   Blood Pressure Monitor MISC For regular home bp monitoring during pregnancy   ferrous gluconate (FERGON) 324 mg, Oral, Every other day   NIFEdipine (PROCARDIA XL/NIFEDICAL XL) 60 mg, Oral, Daily   omeprazole (PRILOSEC) 20 mg, Oral, Daily, 1 tablet a day   Prenatal Vit-Fe Fumarate-FA (PRENATAL VITAMIN PO) Take by mouth.   SODIUM FLUORIDE 5000 PPM 1.1 % PSTE Take by mouth.   valACYclovir (VALTREX) 500 mg, Oral, 2 times daily     Review of Systems:    Pertinent items are noted in HPI Denies abnormal vaginal discharge w/ itching/odor/irritation, headaches, visual changes, shortness of breath, chest pain, abdominal pain, severe nausea/vomiting, or problems with urination or bowel movements unless otherwise stated above. Pertinent History Reviewed:  Reviewed past medical,surgical, social, obstetrical and family history.  Reviewed problem list, medications and allergies. Physical Assessment:   Vitals:   05/18/23 1035  BP: 137/88  Pulse: 77  Weight: 197 lb 9.6 oz (89.6 kg)  Body mass index is 35 kg/m.           Physical Examination:   General appearance: alert, well appearing, and in no distress  Mental status: normal mood, behavior, speech, dress, motor activity, and thought processes  Skin: warm & dry   Extremities: Edema: None    Cardiovascular: normal heart rate noted  Respiratory: normal respiratory effort, no distress  Abdomen: gravid, soft, non-tender  Pelvic: Cervical exam deferred         Fetal Status:     Movement: Present    Fetal Surveillance Testing today: cephalic,BPP 8/8,FHR 150 bpm,posterior placenta gr 3,AFI 18 cm,RI .67,.63,.62=79%    Chaperone: N/A    No results found for this or any previous visit (from the past 24 hours).  Assessment & Plan:  High-risk pregnancy: G2P0101 at [redacted]w[redacted]d with an Estimated Date of Delivery: 06/11/23   -Chronic HTN- on Nifedipine 60mg  daily S/p hospital visit due to headache- now resolved BPP 8/8, continue antepartum testing IOL/repeat C-section scheduled -Prior C-section scheduled for Repeat CD with IUD placement -HSV on suppression, no symptoms Taking Fluconazole for itching -GBS positive -Anemia- s/p IV iron, current Hgb 9.7  Meds: No orders of the defined types were placed in this encounter.   Labs/procedures today: BPP 8/8  Treatment Plan:  routine OB care and as lutined above  Reviewed: Preterm labor symptoms and general obstetric precautions including but not  limited to vaginal bleeding, contractions, leaking of fluid and fetal movement were reviewed in detail with the patient.  All questions were answered. PT has home bp cuff. Check bp weekly, let us know if >160/100   Follow-up: Return for Twice weekly as scheduled.   Future Appointments  Date Time Provider Department Center  05/22/2023  9:15 AM [facility] None  05/22/2023  9:30 AM [facility] NURSE [facility]  05/22/2023 10:50 AM Cheral Marker, CNM [facility]  05/23/2023  9:30 AM [facility] PAT 1 [facility] None    No orders of the defined types were placed in this encounter.   Myna Hidalgo, DO Attending Obstetrician & Gynecologist, [facility] for [employer], [facility]

## 2023-05-18 NOTE — Progress Notes (Signed)
[place] 36+4 wks,cephalic,BPP 8/8,FHR 150 bpm,posterior placenta gr 3,AFI 18 cm,RI .67,.63,.62=79%

## 2023-05-20 ENCOUNTER — Other Ambulatory Visit: Payer: Self-pay | Admitting: Obstetrics & Gynecology

## 2023-05-20 DIAGNOSIS — O0992 Supervision of high risk pregnancy, unspecified, second trimester: Secondary | ICD-10-CM

## 2023-05-20 DIAGNOSIS — O10919 Unspecified pre-existing hypertension complicating pregnancy, unspecified trimester: Secondary | ICD-10-CM

## 2023-05-20 DIAGNOSIS — O34219 Maternal care for unspecified type scar from previous cesarean delivery: Secondary | ICD-10-CM

## 2023-05-20 DIAGNOSIS — O09299 Supervision of pregnancy with other poor reproductive or obstetric history, unspecified trimester: Secondary | ICD-10-CM

## 2023-05-21 ENCOUNTER — Ambulatory Visit
Admission: EM | Admit: 2023-05-21 | Discharge: 2023-05-21 | Disposition: A | Discharge status: Home / Self Care | Payer: Medicaid Other | Attending: Nurse Practitioner | Admitting: Nurse Practitioner

## 2023-05-21 DIAGNOSIS — J069 Acute upper respiratory infection, unspecified: Secondary | ICD-10-CM | POA: Diagnosis not present

## 2023-05-21 LAB — POC COVID19/FLU A&B COMBO
Covid Antigen, POC: NEGATIVE
Influenza A Antigen, POC: NEGATIVE
Influenza B Antigen, POC: NEGATIVE

## 2023-05-21 NOTE — ED Provider Notes (Signed)
[facility]    CSN: [id number] Arrival date & time: 05/21/23  0909      History   Chief Complaint No chief complaint on file.   HPI Donna Conrad is a 27 y.o. female.   Patient presents today for 1 day history of runny, stuffy nose, sore throat, decreased appetite, fatigue, and shortness of breath.  Patient is currently [redacted] weeks pregnant, does not know if the shortness of breath is changed much from the end of her pregnancy.  She denies fever, bodyaches or chills, cough, chest pain, headache, ear pain, nausea/vomiting, diarrhea.  Her daughter has been sick for the past couple of weeks with a cough and runny/stuffy nose, otherwise no known sick contacts.  Has been taking tea and over-the-counter cough drops without much improvement in symptoms.  Reports she is scheduled for a C-section in 4 days and wants to make sure she will be able to have that still.    Past Medical History:  Diagnosis Date   HSV infection    Pre-eclampsia     Patient Active Problem List   Diagnosis Date Noted   Pica 04/02/2023   Anemia affecting pregnancy in third trimester 03/28/2023   Major depressive disorder, recurrent episode, moderate (HCC) 03/12/2023   Generalized anxiety disorder 03/12/2023   PTSD (post-traumatic stress disorder) 03/12/2023   Psychophysiologic insomnia 03/12/2023   Pregnant 03/12/2023   Cannabis use disorder 03/12/2023   Carrier of spinal muscular atrophy 01/01/2023   Herpes 12/05/2022   Prediabetes 12/04/2022   History of cesarean delivery, currently pregnant 12/01/2022   History of pre-eclampsia in prior pregnancy, currently pregnant 11/30/2022   History of HELLP syndrome, currently pregnant 11/30/2022   Supervision of high-risk pregnancy 11/30/2022   Chronic hypertension affecting pregnancy 10/13/2022    Past Surgical History:  Procedure Laterality Date   CESAREAN SECTION N/A 11/19/2014   Procedure: CESAREAN SECTION;  Surgeon: Sherian Rein, MD;   Location: [facility];  Service: Obstetrics;  Laterality: N/A;   WISDOM TOOTH EXTRACTION      OB History     Gravida  2   Para  1   Term      Preterm  1   AB      Living  1      SAB      IAB      Ectopic      Multiple  0   Live Births  1            Home Medications    Prior to Admission medications   Medication Sig Start Date End Date Taking? Authorizing Provider  aspirin EC 81 MG tablet Take 2 tablets (162 mg total) by mouth daily. Take after 12 weeks for prevention of preeclampsia later in pregnancy Patient taking differently: Take 81 mg by mouth daily. Take after 12 weeks for prevention of preeclampsia later in pregnancy 12/01/22   Federico Flake, MD  Blood Pressure Monitor MISC For regular home bp monitoring during pregnancy 12/01/22   Federico Flake, MD  ferrous gluconate (FERGON) 324 MG tablet Take 1 tablet (324 mg total) by mouth every other day. Patient not taking: Reported on 05/18/2023 03/19/23 06/17/23  Myna Hidalgo, DO  NIFEdipine (PROCARDIA XL/NIFEDICAL XL) 60 MG 24 hr tablet Take 1 tablet (60 mg total) by mouth daily. 12/01/22   Federico Flake, MD  omeprazole (PRILOSEC) 20 MG capsule Take 1 capsule (20 mg total) by mouth daily. 1 tablet a day 03/13/23   Eure,  Amaryllis Dyke, MD  Prenatal Vit-Fe Fumarate-FA (PRENATAL VITAMIN PO) Take by mouth.    [provider]  SODIUM FLUORIDE 5000 PPM 1.1 % PSTE Take by mouth. 03/13/23   [provider]  valACYclovir (VALTREX) 500 MG tablet Take 1 tablet (500 mg total) by mouth 2 (two) times daily. 01/01/23   Cheral Marker, CNM    Family History Family History  Problem Relation Age of Onset   Asthma Mother    Diabetes Mother    Hypertension Mother    Miscarriages / [place] Mother    Stroke Mother    Bipolar disorder Mother    Asthma Father    Depression Father    Stroke Father    Diabetes Sister    Mental illness Sister    Miscarriages / [place] Sister    Bipolar  disorder Sister    Diabetes Brother    Asthma Daughter    Diabetes Maternal Aunt     Social History Social History   Tobacco Use   Smoking status: Former    Types: Cigarettes   Smokeless tobacco: Never   Tobacco comments:    Pt reports smoking only some days, but has stopped since finding out she was pregnant.   Vaping Use   Vaping status: Never Used  Substance Use Topics   Alcohol use: Not Currently    Comment: Last drink in February 2024.  1 person a bottle of gray goose vodka at a time   Drug use: Yes    Types: Marijuana    Comment: Couple hits of a joint last use in early October 2024     Allergies   Patient has no known allergies.   Review of Systems Review of Systems Per HPI  Physical Exam Triage Vital Signs ED Triage Vitals  Encounter Vitals Group     BP 05/21/23 1009 134/83     Systolic BP Percentile --      Diastolic BP Percentile --      Pulse Rate 05/21/23 1009 86     Resp 05/21/23 1009 17     Temp 05/21/23 1009 98.4 F (36.9 C)     Temp Source 05/21/23 1009 Oral     SpO2 05/21/23 1009 97 %     Weight --      Height --      Head Circumference --      Peak Flow --      Pain Score 05/21/23 1012 0     Pain Loc --      Pain Education --      Exclude from Growth Chart --    No data found.  Updated Vital Signs BP 134/83 (BP Location: Right Arm)   Pulse 86   Temp 98.4 F (36.9 C) (Oral)   Resp 17   LMP 09/04/2022 (Exact Date)   SpO2 97%   Visual Acuity Right Eye Distance:   Left Eye Distance:   Bilateral Distance:    Right Eye Near:   Left Eye Near:    Bilateral Near:     Physical Exam Vitals and nursing note reviewed.  Constitutional:      General: She is not in acute distress.    Appearance: Normal appearance. She is not ill-appearing or toxic-appearing.  HENT:     Head: Normocephalic and atraumatic.     Right Ear: Tympanic membrane, ear canal and external ear normal.     Left Ear: Tympanic membrane, ear canal and external ear  normal.  Nose: Congestion present. No rhinorrhea.     Mouth/Throat:     Mouth: Mucous membranes are moist.     Pharynx: Oropharynx is clear. No oropharyngeal exudate or posterior oropharyngeal erythema.  Eyes:     General: No scleral icterus.    Extraocular Movements: Extraocular movements intact.  Cardiovascular:     Rate and Rhythm: Normal rate and regular rhythm.  Pulmonary:     Effort: Pulmonary effort is normal. No respiratory distress.     Breath sounds: Normal breath sounds. No wheezing, rhonchi or rales.  Musculoskeletal:     Cervical back: Normal range of motion and neck supple.  Lymphadenopathy:     Cervical: No cervical adenopathy.  Skin:    General: Skin is warm and dry.     Coloration: Skin is not jaundiced or pale.     Findings: No erythema or rash.  Neurological:     Mental Status: She is alert and oriented to person, place, and time.  Psychiatric:        Behavior: Behavior is cooperative.      UC Treatments / Results  Labs (all labs ordered are listed, but only abnormal results are displayed) Labs Reviewed  POC COVID19/FLU A&B COMBO    EKG   Radiology No results found.  Procedures Procedures (including critical care time)  Medications Ordered in UC Medications - No data to display  Initial Impression / Assessment and Plan / UC Course  I have reviewed the triage vital signs and the nursing notes.  Pertinent labs & imaging results that were available during my care of the patient were reviewed by me and considered in my medical decision making (see chart for details).   Patient is well-appearing, normotensive, afebrile, not tachycardic, not tachypneic, oxygenating well on room air.    1. Viral URI Overall, vitals and exam are reassuring COVID-19 and influenza test is negative Suspect viral etiology Supportive care discussed including medication safe in pregnancy Work excuse provided Return and ER precautions discussed  The patient was  given the opportunity to ask questions.  All questions answered to their satisfaction.  The patient is in agreement to this plan.    Final Clinical Impressions(s) / UC Diagnoses   Final diagnoses:  Viral URI     Discharge Instructions      You have a viral illness.  Symptoms should improve over the next week to 10 days.  COVID-19 and influenza tests are negative today.  OTC Medications safe in pregnancy: - acetaminophen - cepacol throat lozenges - Coricidin HBP: chest congestion/cough, cold/flu - alcohol free cough drops - dextromethorphan - guaifenesin - Neti pot/saline nasal rinses - Vicks vapo rub     ED Prescriptions   None    PDMP not reviewed this encounter.   Valentino Nose, NP 05/21/23 1159

## 2023-05-21 NOTE — ED Triage Notes (Signed)
Sore throat and nasal congestion x 1 day and would also like to be tested for COVID and flu due to having baby on Friday.

## 2023-05-21 NOTE — Discharge Instructions (Signed)
You have a viral illness.  Symptoms should improve over the next week to 10 days.  COVID-19 and influenza tests are negative today.  OTC Medications safe in pregnancy: - acetaminophen - cepacol throat lozenges - Coricidin HBP: chest congestion/cough, cold/flu - alcohol free cough drops - dextromethorphan - guaifenesin - Neti pot/saline nasal rinses - Vicks vapo rub

## 2023-05-22 ENCOUNTER — Ambulatory Visit (INDEPENDENT_AMBULATORY_CARE_PROVIDER_SITE_OTHER): Payer: Medicaid Other | Admitting: Women's Health

## 2023-05-22 ENCOUNTER — Ambulatory Visit (INDEPENDENT_AMBULATORY_CARE_PROVIDER_SITE_OTHER): Payer: Medicaid Other | Admitting: Radiology

## 2023-05-22 ENCOUNTER — Other Ambulatory Visit: Payer: Medicaid Other

## 2023-05-22 ENCOUNTER — Encounter: Payer: Self-pay | Admitting: Women's Health

## 2023-05-22 VITALS — BP 129/85 | HR 103 | Wt 202.0 lb

## 2023-05-22 DIAGNOSIS — O09293 Supervision of pregnancy with other poor reproductive or obstetric history, third trimester: Secondary | ICD-10-CM

## 2023-05-22 DIAGNOSIS — Z3A37 37 weeks gestation of pregnancy: Secondary | ICD-10-CM

## 2023-05-22 DIAGNOSIS — O10919 Unspecified pre-existing hypertension complicating pregnancy, unspecified trimester: Secondary | ICD-10-CM

## 2023-05-22 DIAGNOSIS — O09299 Supervision of pregnancy with other poor reproductive or obstetric history, unspecified trimester: Secondary | ICD-10-CM

## 2023-05-22 DIAGNOSIS — O0993 Supervision of high risk pregnancy, unspecified, third trimester: Secondary | ICD-10-CM | POA: Diagnosis not present

## 2023-05-22 DIAGNOSIS — O0992 Supervision of high risk pregnancy, unspecified, second trimester: Secondary | ICD-10-CM

## 2023-05-22 DIAGNOSIS — O10913 Unspecified pre-existing hypertension complicating pregnancy, third trimester: Secondary | ICD-10-CM

## 2023-05-22 DIAGNOSIS — O34219 Maternal care for unspecified type scar from previous cesarean delivery: Secondary | ICD-10-CM | POA: Diagnosis not present

## 2023-05-22 DIAGNOSIS — O10013 Pre-existing essential hypertension complicating pregnancy, third trimester: Secondary | ICD-10-CM

## 2023-05-22 LAB — POCT URINALYSIS DIPSTICK OB
Blood, UA: NEGATIVE
Glucose, UA: NEGATIVE
Ketones, UA: NEGATIVE
Leukocytes, UA: NEGATIVE
Nitrite, UA: NEGATIVE
POC,PROTEIN,UA: NEGATIVE

## 2023-05-22 NOTE — Patient Instructions (Addendum)
Ethelyne, thank you for choosing our office today! We appreciate the opportunity to meet your healthcare needs. You may receive a short survey by mail, e-mail, or through [employer]. If you are happy with your care we would appreciate if you could take just a few minutes to complete the survey questions. We read all of your comments and take your feedback very seriously. Thank you again for choosing our office.  Center for [employer] Healthcare Team at [facility]  [facility] at [facility] ([address] [place], [place] [address]) Entrance C, located off of [address] 24/7 valet parking   For [employer] symptoms: Humidifier and saline nasal spray for nasal congestion Regular robitussin, cough drops for cough Warm salt water gargles for sore throat Mucinex with lots of water to help you cough up the mucous in your chest if needed Drink plenty of fluids and stay hydrated! Wash your hands frequently. Call if you are not improving by 7-10 days.    CLASSES: Go to [email or web address] to register for classes (childbirth, breastfeeding, waterbirth, infant CPR, daddy bootcamp, etc.)  Call the office [phone]) or go to [facility] if: You begin to have strong, frequent contractions Your water breaks.  Sometimes it is a big gush of fluid, sometimes it is just a trickle that keeps getting your panties wet or running down your legs You have vaginal bleeding.  It is normal to have a small amount of spotting if your cervix was checked.  You don't feel your baby moving like normal.  If you don't, get you something to eat and drink and lay down and focus on feeling your baby move.   If your baby is still not moving like normal, you should call the office or go to [facility].  Call the office [phone]) or go to [facility] hospital for these signs of pre-eclampsia: Severe headache that does not go away with Tylenol Visual changes- seeing spots, double, blurred vision Pain under your right  breast or upper abdomen that does not go away with Tums or heartburn medicine Nausea and/or vomiting Severe swelling in your hands, feet, and face   [place] Pediatricians/Family Doctors  [facility]): [address] Colette Ribas, [phone]           [facility]: [address], [phone]                [facility]): [address], [phone] (call to ask if accepting patients) [facility] Department: [address] [place], [phone]    [facility] Pediatricians/Family Doctors [facility]): [address], [phone] [facility]: [address] [place], [phone] [facility] [place]: [address], [phone]  [facility] Doctors  [facility]): [phone] [facility]: [address], [phone]   [facility] Doctors [facility]: [address] [phone]  [facility] Doctors  [facility]: [phone], [phone]  Home Blood Pressure Monitoring for Patients   Your provider has recommended that you check your blood pressure (BP) at least once a week at home. If you do not have a blood pressure cuff at home, one will be provided for you. Contact your provider if you have not received your monitor within 1 week.   Helpful Tips for Accurate Home Blood Pressure Checks  Don't smoke, exercise, or drink caffeine 30 minutes before  checking your BP Use the restroom before checking your BP (a full bladder can raise your pressure) Relax in a comfortable upright chair Feet on the ground Left arm resting comfortably on a flat surface at the level of your heart Legs uncrossed Back supported Sit quietly and don't talk Place the cuff on your bare arm Adjust snuggly, so that only two fingertips can fit between your skin and the top of the  cuff Check 2 readings separated by at least one minute Keep a log of your BP readings For a visual, please reference this diagram: http://ccnc.care/bpdiagram  Provider Name: [facility]     Phone: [phone]  Zone 1: ALL CLEAR  Continue to monitor your symptoms:  BP reading is less than 140 (top number) or less than 90 (bottom number)  No right upper stomach pain No headaches or seeing spots No feeling nauseated or throwing up No swelling in face and hands  Zone 2: CAUTION Call your doctor's office for any of the following:  BP reading is greater than 140 (top number) or greater than 90 (bottom number)  Stomach pain under your ribs in the middle or right side Headaches or seeing spots Feeling nauseated or throwing up Swelling in face and hands  Zone 3: EMERGENCY  Seek immediate medical care if you have any of the following:  BP reading is greater than160 (top number) or greater than 110 (bottom number) Severe headaches not improving with Tylenol Serious difficulty catching your breath Any worsening symptoms from Zone 2   Braxton Hicks Contractions Contractions of the uterus can occur throughout pregnancy, but they are not always a sign that you are in labor. You may have practice contractions called Braxton Hicks contractions. These false labor contractions are sometimes confused with true labor. What are Deberah Pelton contractions? Braxton Hicks contractions are tightening movements that occur in the muscles of the uterus before labor. Unlike true labor contractions, these contractions do not result in opening (dilation) and thinning of the cervix. Toward the end of pregnancy (32-34 weeks), Braxton Hicks contractions can happen more often and may become stronger. These contractions are sometimes difficult to tell apart from true labor because they can be very uncomfortable. You should not feel embarrassed if you go to the hospital with false labor. Sometimes, the only way  to tell if you are in true labor is for your health care provider to look for changes in the cervix. The health care provider will do a physical exam and may monitor your contractions. If you are not in true labor, the exam should show that your cervix is not dilating and your water has not broken. If there are no other health problems associated with your pregnancy, it is completely safe for you to be sent home with false labor. You may continue to have Braxton Hicks contractions until you go into true labor. How to tell the difference between true labor and false labor True labor Contractions last 30-70 seconds. Contractions become very regular. Discomfort is usually felt in the top of the uterus, and it spreads to the lower abdomen and low back. Contractions do not go away with walking. Contractions usually become more intense and increase in frequency. The cervix dilates and gets thinner. False labor Contractions are usually shorter and not as strong as true labor contractions. Contractions are usually irregular. Contractions are often felt in the front of the lower abdomen and in the groin. Contractions may go away when you walk around or change  positions while lying down. Contractions get weaker and are shorter-lasting as time goes on. The cervix usually does not dilate or become thin. Follow these instructions at home:  Take over-the-counter and prescription medicines only as told by your health care provider. Keep up with your usual exercises and follow other instructions from your health care provider. Eat and drink lightly if you think you are going into labor. If Braxton Hicks contractions are making you uncomfortable: Change your position from lying down or resting to walking, or change from walking to resting. Sit and rest in a tub of warm water. Drink enough fluid to keep your urine pale yellow. Dehydration may cause these contractions. Do slow and deep breathing several times  an hour. Keep all follow-up prenatal visits as told by your health care provider. This is important. Contact a health care provider if: You have a fever. You have continuous pain in your abdomen. Get help right away if: Your contractions become stronger, more regular, and closer together. You have fluid leaking or gushing from your vagina. You pass blood-tinged mucus (bloody show). You have bleeding from your vagina. You have low back pain that you never had before. You feel your baby's head pushing down and causing pelvic pressure. Your baby is not moving inside you as much as it used to. Summary Contractions that occur before labor are called Braxton Hicks contractions, false labor, or practice contractions. Braxton Hicks contractions are usually shorter, weaker, farther apart, and less regular than true labor contractions. True labor contractions usually become progressively stronger and regular, and they become more frequent. Manage discomfort from [facility] contractions by changing position, resting in a warm bath, drinking plenty of water, or practicing deep breathing. This information is not intended to replace advice given to you by your health care provider. Make sure you discuss any questions you have with your health care provider. Document Revised: 05/04/2017 Document Reviewed: 10/05/2016 Elsevier Patient Education  2020 [employer].

## 2023-05-22 NOTE — Progress Notes (Signed)
HIGH-RISK PREGNANCY VISIT Patient name: Donna Conrad MRN [id number]  Date of birth: 27-Apr-1996 Chief Complaint:   High Risk Gestation ([place] today!!)  History of Present Illness:   Donna Conrad is a 27 y.o. G84P0101 female at [redacted]w[redacted]d with an Estimated Date of Delivery: 06/11/23 being seen today for ongoing management of a high-risk pregnancy complicated by chronic hypertension currently on nifedipine 60mg  daily.    Today she reports  has a cold . Contractions: Not present. Vag. Bleeding: None.  Movement: Present. denies leaking of fluid.      04/24/2023   10:27 AM 04/10/2023    9:15 AM 03/12/2023   10:41 AM 02/19/2023    9:26 AM 02/01/2023    8:22 AM  Depression screen PHQ 2/9  Decreased Interest 1 1  0 1  Down, Depressed, Hopeless 1 1  1 2   PHQ - 2 Score 2 2  1 3   Altered sleeping 1 1  3  0  Tired, decreased energy 1 1  3 3   Change in appetite 3 3  2 2   Feeling bad or failure about yourself  1 1  2 3   Trouble concentrating 1 1  2 3   Moving slowly or fidgety/restless 1 0  0 0  Suicidal thoughts 0 0  0 0  PHQ-9 Score 10 9  13 14   Difficult doing work/chores Not difficult at all Not difficult at all        Information is confidential and restricted. Go to Review Flowsheets to unlock data.        02/19/2023    9:28 AM 02/01/2023    8:25 AM 12/01/2022    9:10 AM  GAD 7 : Generalized Anxiety Score  Nervous, Anxious, on Edge 3 3 3   Control/stop worrying 2 3 3   Worry too much - different things 2 3 3   Trouble relaxing 2 3 3   Restless 1 0 0  Easily annoyed or irritable 3 3 3   Afraid - awful might happen 3 3 3   Total GAD 7 Score 16 18 18      Review of Systems:   Pertinent items are noted in HPI Denies abnormal vaginal discharge w/ itching/odor/irritation, headaches, visual changes, shortness of breath, chest pain, abdominal pain, severe nausea/vomiting, or problems with urination or bowel movements unless otherwise stated above. Pertinent History Reviewed:  Reviewed past  medical,surgical, social, obstetrical and family history.  Reviewed problem list, medications and allergies. Physical Assessment:   Vitals:   05/22/23 0951  BP: 129/85  Pulse: (!) 103  Weight: 202 lb (91.6 kg)  Body mass index is 35.78 kg/m.           Physical Examination:   General appearance: alert, well appearing, and in no distress  Mental status: alert, oriented to person, place, and time  Skin: warm & dry   Extremities: Edema: None    Cardiovascular: normal heart rate noted  Respiratory: normal respiratory effort, no distress  Abdomen: gravid, soft, non-tender  Pelvic: Cervical exam deferred         Fetal Status:     Movement: Present    Fetal Surveillance Testing today:  GA 37+1 Single active fetus, cephalic FHR = 143 bpm Posterior pl gr2 AFI=15.6 cm SVP = 4.7 cm BPP = 8/8  RI: 0.51, 0.55  28%  Chaperone: N/A    Results for orders placed or performed in visit on 05/22/23 (from the past 24 hours)  POC Urinalysis Dipstick OB   Collection Time: 05/22/23  9:47  AM  Result Value Ref Range   Color, UA     Clarity, UA     Glucose, UA Negative Negative   Bilirubin, UA     Ketones, UA neg    Spec Grav, UA     Blood, UA neg    pH, UA     POC,PROTEIN,UA Negative Negative, Trace, Small (1+), Moderate (2+), Large (3+), 4+   Urobilinogen, UA     Nitrite, UA neg    Leukocytes, UA Negative Negative   Appearance     Odor    Results for orders placed or performed during the hospital encounter of 05/21/23 (from the past 24 hours)  POC Covid + Flu A/B Antigen   Collection Time: 05/21/23 10:32 AM  Result Value Ref Range   Influenza A Antigen, POC Negative Negative   Influenza B Antigen, POC Negative Negative   Covid Antigen, POC Negative Negative    Assessment & Plan:  High-risk pregnancy: G2P0101 at [redacted]w[redacted]d with an Estimated Date of Delivery: 06/11/23   1) CHTN, stable on nifedipine 60mg  daily  2) Prev c/s, for RCS w/ IUD 12/20  3) Anemia> s/p IV Fe, last hgb 9.7  4)  Cold sx> gave printed otc relief measures  Meds: No orders of the defined types were placed in this encounter.  Labs/procedures today: U/S  Treatment Plan:  RCS 12/20 as scheduled  Reviewed: Term labor symptoms and general obstetric precautions including but not limited to vaginal bleeding, contractions, leaking of fluid and fetal movement were reviewed in detail with the patient.  All questions were answered. Does have home bp cuff. Office bp cuff given: not applicable. Check bp weekly, let us know if consistently >140 and/or >90.  Follow-up: Return for 12/27 for incision & bp check.   Future Appointments  Date Time Provider Department Center  05/22/2023 10:50 AM Cheral Marker, CNM [facility]  05/23/2023  9:30 AM [facility] 1 [facility] None    Orders Placed This Encounter  Procedures   POC Urinalysis Dipstick OB   Cheral Marker CNM, [facility] 05/22/2023 10:10 AM

## 2023-05-22 NOTE — Progress Notes (Signed)
GA 37+1 Single active fetus, cephalic FHR = 143 bpm Posterior pl gr2 AFI=15.6 cm SVP = 4.7 cm BPP = 8/8  RI: 0.51, 0.55  28%

## 2023-05-23 ENCOUNTER — Other Ambulatory Visit: Payer: Self-pay | Admitting: Obstetrics and Gynecology

## 2023-05-23 ENCOUNTER — Encounter (HOSPITAL_COMMUNITY)
Admission: RE | Admit: 2023-05-23 | Discharge: 2023-05-23 | Disposition: A | Discharge status: Home / Self Care | Payer: Medicaid Other | Source: Ambulatory Visit | Attending: Obstetrics and Gynecology | Admitting: Obstetrics and Gynecology

## 2023-05-23 DIAGNOSIS — Z01812 Encounter for preprocedural laboratory examination: Secondary | ICD-10-CM | POA: Insufficient documentation

## 2023-05-23 DIAGNOSIS — O10919 Unspecified pre-existing hypertension complicating pregnancy, unspecified trimester: Secondary | ICD-10-CM | POA: Diagnosis not present

## 2023-05-23 DIAGNOSIS — O0993 Supervision of high risk pregnancy, unspecified, third trimester: Secondary | ICD-10-CM | POA: Diagnosis not present

## 2023-05-23 LAB — CBC
HCT: 33 % — ABNORMAL LOW (ref 36.0–46.0)
Hemoglobin: 10.7 g/dL — ABNORMAL LOW (ref 12.0–15.0)
MCH: 28.8 pg (ref 26.0–34.0)
MCHC: 32.4 g/dL (ref 30.0–36.0)
MCV: 88.7 fL (ref 80.0–100.0)
Platelets: 232 10*3/uL (ref 150–400)
RBC: 3.72 MIL/uL — ABNORMAL LOW (ref 3.87–5.11)
RDW: 19.8 % — ABNORMAL HIGH (ref 11.5–15.5)
WBC: 4.9 10*3/uL (ref 4.0–10.5)
nRBC: 0 % (ref 0.0–0.2)

## 2023-05-23 LAB — TYPE AND SCREEN
ABO/RH(D): O POS
Antibody Screen: NEGATIVE

## 2023-05-24 ENCOUNTER — Encounter: Payer: Self-pay | Admitting: Obstetrics & Gynecology

## 2023-05-24 LAB — RPR: RPR Ser Ql: NONREACTIVE

## 2023-05-25 ENCOUNTER — Encounter (HOSPITAL_COMMUNITY)
Admission: AD | Disposition: A | Discharge status: Home / Self Care | Payer: Self-pay | Source: Home / Self Care | Attending: Obstetrics & Gynecology

## 2023-05-25 ENCOUNTER — Inpatient Hospital Stay (HOSPITAL_COMMUNITY): Payer: Medicaid Other | Admitting: Anesthesiology

## 2023-05-25 ENCOUNTER — Other Ambulatory Visit: Payer: Medicaid Other

## 2023-05-25 ENCOUNTER — Inpatient Hospital Stay (HOSPITAL_COMMUNITY)
Admission: AD | Admit: 2023-05-25 | Discharge: 2023-05-27 | DRG: 787 | Disposition: A | Discharge status: Home / Self Care | Payer: Medicaid Other | Attending: Obstetrics & Gynecology | Admitting: Obstetrics & Gynecology

## 2023-05-25 ENCOUNTER — Encounter (HOSPITAL_COMMUNITY): Payer: Self-pay | Admitting: Obstetrics and Gynecology

## 2023-05-25 ENCOUNTER — Other Ambulatory Visit: Payer: Self-pay

## 2023-05-25 DIAGNOSIS — Z3A37 37 weeks gestation of pregnancy: Secondary | ICD-10-CM

## 2023-05-25 DIAGNOSIS — O9832 Other infections with a predominantly sexual mode of transmission complicating childbirth: Secondary | ICD-10-CM | POA: Diagnosis present

## 2023-05-25 DIAGNOSIS — O99824 Streptococcus B carrier state complicating childbirth: Secondary | ICD-10-CM | POA: Diagnosis present

## 2023-05-25 DIAGNOSIS — O1092 Unspecified pre-existing hypertension complicating childbirth: Secondary | ICD-10-CM | POA: Diagnosis present

## 2023-05-25 DIAGNOSIS — O9081 Anemia of the puerperium: Secondary | ICD-10-CM | POA: Diagnosis not present

## 2023-05-25 DIAGNOSIS — Z8249 Family history of ischemic heart disease and other diseases of the circulatory system: Secondary | ICD-10-CM | POA: Diagnosis not present

## 2023-05-25 DIAGNOSIS — Z87891 Personal history of nicotine dependence: Secondary | ICD-10-CM | POA: Diagnosis not present

## 2023-05-25 DIAGNOSIS — Z5986 Financial insecurity: Secondary | ICD-10-CM | POA: Diagnosis not present

## 2023-05-25 DIAGNOSIS — Z3490 Encounter for supervision of normal pregnancy, unspecified, unspecified trimester: Principal | ICD-10-CM

## 2023-05-25 DIAGNOSIS — O99344 Other mental disorders complicating childbirth: Secondary | ICD-10-CM | POA: Diagnosis not present

## 2023-05-25 DIAGNOSIS — Z833 Family history of diabetes mellitus: Secondary | ICD-10-CM

## 2023-05-25 DIAGNOSIS — A6000 Herpesviral infection of urogenital system, unspecified: Secondary | ICD-10-CM | POA: Diagnosis present

## 2023-05-25 DIAGNOSIS — O10919 Unspecified pre-existing hypertension complicating pregnancy, unspecified trimester: Secondary | ICD-10-CM | POA: Diagnosis present

## 2023-05-25 DIAGNOSIS — R059 Cough, unspecified: Secondary | ICD-10-CM | POA: Diagnosis not present

## 2023-05-25 DIAGNOSIS — D62 Acute posthemorrhagic anemia: Secondary | ICD-10-CM | POA: Diagnosis not present

## 2023-05-25 DIAGNOSIS — O34211 Maternal care for low transverse scar from previous cesarean delivery: Secondary | ICD-10-CM

## 2023-05-25 DIAGNOSIS — Z98891 History of uterine scar from previous surgery: Principal | ICD-10-CM

## 2023-05-25 DIAGNOSIS — O99820 Streptococcus B carrier state complicating pregnancy: Secondary | ICD-10-CM | POA: Diagnosis not present

## 2023-05-25 DIAGNOSIS — O1002 Pre-existing essential hypertension complicating childbirth: Secondary | ICD-10-CM | POA: Diagnosis not present

## 2023-05-25 HISTORY — PX: INTRAUTERINE DEVICE (IUD) INSERTION: SHX5877

## 2023-05-25 SURGERY — Surgical Case
Anesthesia: Monitor Anesthesia Care

## 2023-05-25 MED ORDER — PANTOPRAZOLE SODIUM 40 MG PO TBEC
40.0000 mg | DELAYED_RELEASE_TABLET | Freq: Every day | ORAL | Status: DC
Start: 2023-05-25 — End: 2023-05-27
  Administered 2023-05-26 – 2023-05-27 (×2): 40 mg via ORAL
  Filled 2023-05-25 (×2): qty 1

## 2023-05-25 MED ORDER — BUPIVACAINE IN DEXTROSE 0.75-8.25 % IT SOLN
INTRATHECAL | Status: DC | PRN
  Administered 2023-05-25: 1.6 mL via INTRATHECAL

## 2023-05-25 MED ORDER — SENNOSIDES-DOCUSATE SODIUM 8.6-50 MG PO TABS
2.0000 | ORAL_TABLET | Freq: Every day | ORAL | Status: DC
  Administered 2023-05-26 – 2023-05-27 (×2): 2 via ORAL
  Filled 2023-05-25 (×2): qty 2

## 2023-05-25 MED ORDER — STERILE WATER FOR IRRIGATION IR SOLN
Status: DC | PRN
  Administered 2023-05-25: 1

## 2023-05-25 MED ORDER — NIFEDIPINE ER OSMOTIC RELEASE 30 MG PO TB24: 60.0000 mg | ORAL_TABLET | Freq: Every day | ORAL | Status: DC

## 2023-05-25 MED ORDER — LACTATED RINGERS IV SOLN: INTRAVENOUS | Status: DC

## 2023-05-25 MED ORDER — KETOROLAC TROMETHAMINE 30 MG/ML IJ SOLN
30.0000 mg | Freq: Four times a day (QID) | INTRAMUSCULAR | Status: AC
  Administered 2023-05-25 – 2023-05-26 (×3): 30 mg via INTRAVENOUS
  Filled 2023-05-25 (×3): qty 1

## 2023-05-25 MED ORDER — CEFAZOLIN SODIUM-DEXTROSE 2-4 GM/100ML-% IV SOLN
INTRAVENOUS | Status: AC
  Filled 2023-05-25: qty 100

## 2023-05-25 MED ORDER — NIFEDIPINE ER OSMOTIC RELEASE 30 MG PO TB24
60.0000 mg | ORAL_TABLET | Freq: Every day | ORAL | Status: DC
  Administered 2023-05-25 – 2023-05-27 (×3): 60 mg via ORAL
  Filled 2023-05-25 (×3): qty 2

## 2023-05-25 MED ORDER — FENTANYL CITRATE (PF) 100 MCG/2ML IJ SOLN
INTRAMUSCULAR | Status: AC
  Filled 2023-05-25: qty 2

## 2023-05-25 MED ORDER — PHENYLEPHRINE HCL-NACL 20-0.9 MG/250ML-% IV SOLN
INTRAVENOUS | Status: DC | PRN
  Administered 2023-05-25: 60 ug/min via INTRAVENOUS

## 2023-05-25 MED ORDER — PHENYLEPHRINE 80 MCG/ML (10ML) SYRINGE FOR IV PUSH (FOR BLOOD PRESSURE SUPPORT)
PREFILLED_SYRINGE | INTRAVENOUS | Status: AC
  Filled 2023-05-25: qty 10

## 2023-05-25 MED ORDER — MENTHOL 3 MG MT LOZG
1.0000 | LOZENGE | OROMUCOSAL | Status: DC | PRN
  Administered 2023-05-25 – 2023-05-27 (×3): 3 mg via ORAL
  Filled 2023-05-25 (×5): qty 9

## 2023-05-25 MED ORDER — OXYTOCIN-SODIUM CHLORIDE 30-0.9 UT/500ML-% IV SOLN
INTRAVENOUS | Status: DC | PRN
  Administered 2023-05-25: 300 mL via INTRAVENOUS

## 2023-05-25 MED ORDER — ONDANSETRON HCL 4 MG/2ML IJ SOLN
INTRAMUSCULAR | Status: DC | PRN
  Administered 2023-05-25: 4 mg via INTRAVENOUS

## 2023-05-25 MED ORDER — PHENYLEPHRINE HCL (PRESSORS) 10 MG/ML IV SOLN
INTRAVENOUS | Status: DC | PRN
  Administered 2023-05-25: 160 ug via INTRAVENOUS

## 2023-05-25 MED ORDER — OXYCODONE HCL 5 MG PO TABS
5.0000 mg | ORAL_TABLET | ORAL | Status: DC | PRN
  Administered 2023-05-26 – 2023-05-27 (×2): 10 mg via ORAL
  Filled 2023-05-25 (×2): qty 2

## 2023-05-25 MED ORDER — ONDANSETRON HCL 4 MG/2ML IJ SOLN
INTRAMUSCULAR | Status: AC
  Filled 2023-05-25: qty 2

## 2023-05-25 MED ORDER — SOD CITRATE-CITRIC ACID 500-334 MG/5ML PO SOLN
30.0000 mL | ORAL | Status: AC
  Administered 2023-05-25: 30 mL via ORAL

## 2023-05-25 MED ORDER — SCOPOLAMINE 1 MG/3DAYS TD PT72
MEDICATED_PATCH | TRANSDERMAL | Status: DC | PRN
  Administered 2023-05-25: 1 via TRANSDERMAL

## 2023-05-25 MED ORDER — DEXMEDETOMIDINE HCL IN NACL 80 MCG/20ML IV SOLN
INTRAVENOUS | Status: AC
  Filled 2023-05-25: qty 20

## 2023-05-25 MED ORDER — DIBUCAINE (PERIANAL) 1 % EX OINT: 1.0000 | TOPICAL_OINTMENT | CUTANEOUS | Status: DC | PRN

## 2023-05-25 MED ORDER — SIMETHICONE 80 MG PO CHEW: 80.0000 mg | CHEWABLE_TABLET | ORAL | Status: DC | PRN

## 2023-05-25 MED ORDER — HYDROMORPHONE HCL 1 MG/ML IJ SOLN
0.2000 mg | INTRAMUSCULAR | Status: DC | PRN
Start: 2023-05-25 — End: 2023-05-27

## 2023-05-25 MED ORDER — IBUPROFEN 600 MG PO TABS
600.0000 mg | ORAL_TABLET | Freq: Four times a day (QID) | ORAL | Status: DC
  Administered 2023-05-26 – 2023-05-27 (×5): 600 mg via ORAL
  Filled 2023-05-25 (×5): qty 1

## 2023-05-25 MED ORDER — FUROSEMIDE 20 MG PO TABS
20.0000 mg | ORAL_TABLET | Freq: Every day | ORAL | Status: DC
  Administered 2023-05-25 – 2023-05-27 (×3): 20 mg via ORAL
  Filled 2023-05-25 (×3): qty 1

## 2023-05-25 MED ORDER — ACETAMINOPHEN 10 MG/ML IV SOLN
INTRAVENOUS | Status: AC
  Filled 2023-05-25: qty 100

## 2023-05-25 MED ORDER — DIPHENHYDRAMINE HCL 25 MG PO CAPS
25.0000 mg | ORAL_CAPSULE | Freq: Four times a day (QID) | ORAL | Status: DC | PRN
  Administered 2023-05-27: 25 mg via ORAL
  Filled 2023-05-25: qty 1

## 2023-05-25 MED ORDER — SOD CITRATE-CITRIC ACID 500-334 MG/5ML PO SOLN
ORAL | Status: AC
  Filled 2023-05-25: qty 30

## 2023-05-25 MED ORDER — PHENYLEPHRINE HCL-NACL 20-0.9 MG/250ML-% IV SOLN
INTRAVENOUS | Status: AC
  Filled 2023-05-25: qty 250

## 2023-05-25 MED ORDER — FENTANYL CITRATE (PF) 100 MCG/2ML IJ SOLN
INTRAMUSCULAR | Status: DC | PRN
  Administered 2023-05-25: 15 ug via INTRATHECAL

## 2023-05-25 MED ORDER — WITCH HAZEL-GLYCERIN EX PADS: 1.0000 | MEDICATED_PAD | CUTANEOUS | Status: DC | PRN

## 2023-05-25 MED ORDER — TRANEXAMIC ACID-NACL 1000-0.7 MG/100ML-% IV SOLN
INTRAVENOUS | Status: AC
  Filled 2023-05-25: qty 100

## 2023-05-25 MED ORDER — DEXAMETHASONE SODIUM PHOSPHATE 10 MG/ML IJ SOLN
INTRAMUSCULAR | Status: DC | PRN
  Administered 2023-05-25: 8 mg via INTRAVENOUS

## 2023-05-25 MED ORDER — LEVONORGESTREL 20 MCG/DAY IU IUD: 1.0000 | INTRAUTERINE_SYSTEM | Freq: Once | INTRAUTERINE | Status: DC

## 2023-05-25 MED ORDER — METOCLOPRAMIDE HCL 5 MG/ML IJ SOLN
INTRAMUSCULAR | Status: AC
Start: 2023-05-25 — End: ?
  Filled 2023-05-25: qty 2

## 2023-05-25 MED ORDER — ONDANSETRON HCL 4 MG PO TABS
4.0000 mg | ORAL_TABLET | Freq: Three times a day (TID) | ORAL | Status: DC | PRN
  Administered 2023-05-25: 4 mg via ORAL
  Filled 2023-05-25: qty 1

## 2023-05-25 MED ORDER — SIMETHICONE 80 MG PO CHEW
80.0000 mg | CHEWABLE_TABLET | Freq: Three times a day (TID) | ORAL | Status: DC
  Administered 2023-05-26 – 2023-05-27 (×3): 80 mg via ORAL
  Filled 2023-05-25 (×4): qty 1

## 2023-05-25 MED ORDER — OXYTOCIN-SODIUM CHLORIDE 30-0.9 UT/500ML-% IV SOLN: 2.5000 [IU]/h | INTRAVENOUS | Status: AC

## 2023-05-25 MED ORDER — DEXMEDETOMIDINE HCL IN NACL 80 MCG/20ML IV SOLN
INTRAVENOUS | Status: DC | PRN
  Administered 2023-05-25: 4 ug via INTRAVENOUS
  Administered 2023-05-25 (×2): 8 ug via INTRAVENOUS
  Administered 2023-05-25: 4 ug via INTRAVENOUS

## 2023-05-25 MED ORDER — METOCLOPRAMIDE HCL 5 MG/ML IJ SOLN
INTRAMUSCULAR | Status: DC | PRN
  Administered 2023-05-25: 10 mg via INTRAVENOUS

## 2023-05-25 MED ORDER — SODIUM CHLORIDE 0.9 % IR SOLN
Status: DC | PRN
  Administered 2023-05-25: 50 mL

## 2023-05-25 MED ORDER — ACETAMINOPHEN 500 MG PO TABS
1000.0000 mg | ORAL_TABLET | Freq: Four times a day (QID) | ORAL | Status: DC
  Administered 2023-05-26 – 2023-05-27 (×7): 1000 mg via ORAL
  Filled 2023-05-25 (×7): qty 2

## 2023-05-25 MED ORDER — DEXAMETHASONE SODIUM PHOSPHATE 4 MG/ML IJ SOLN
INTRAMUSCULAR | Status: AC
  Filled 2023-05-25: qty 2

## 2023-05-25 MED ORDER — ZOLPIDEM TARTRATE 5 MG PO TABS
5.0000 mg | ORAL_TABLET | Freq: Every evening | ORAL | Status: DC | PRN
Start: 2023-05-25 — End: 2023-05-27

## 2023-05-25 MED ORDER — OXYTOCIN-SODIUM CHLORIDE 30-0.9 UT/500ML-% IV SOLN
INTRAVENOUS | Status: AC
  Filled 2023-05-25: qty 500

## 2023-05-25 MED ORDER — ACETAMINOPHEN 10 MG/ML IV SOLN
INTRAVENOUS | Status: DC | PRN
  Administered 2023-05-25: 1000 mg via INTRAVENOUS

## 2023-05-25 MED ORDER — COCONUT OIL OIL: 1.0000 | TOPICAL_OIL | Status: DC | PRN

## 2023-05-25 MED ORDER — SODIUM CHLORIDE 0.9 % IV SOLN
500.0000 mg | INTRAVENOUS | Status: DC
  Filled 2023-05-25: qty 5

## 2023-05-25 MED ORDER — CEFAZOLIN SODIUM-DEXTROSE 2-4 GM/100ML-% IV SOLN
2.0000 g | INTRAVENOUS | Status: AC
  Administered 2023-05-25: 2 g via INTRAVENOUS

## 2023-05-25 MED ORDER — PRENATAL MULTIVITAMIN CH
1.0000 | ORAL_TABLET | Freq: Every day | ORAL | Status: DC
  Administered 2023-05-26 – 2023-05-27 (×2): 1 via ORAL
  Filled 2023-05-25 (×2): qty 1

## 2023-05-25 MED ORDER — MORPHINE SULFATE (PF) 0.5 MG/ML IJ SOLN
INTRAMUSCULAR | Status: AC
  Filled 2023-05-25: qty 10

## 2023-05-25 MED ORDER — OXYTOCIN-SODIUM CHLORIDE 30-0.9 UT/500ML-% IV SOLN
INTRAVENOUS | Status: AC
Start: 2023-05-25 — End: ?
  Filled 2023-05-25: qty 500

## 2023-05-25 MED ORDER — LEVONORGESTREL 20 MCG/DAY IU IUD
INTRAUTERINE_SYSTEM | INTRAUTERINE | Status: AC
  Filled 2023-05-25: qty 1

## 2023-05-25 MED ORDER — MORPHINE SULFATE (PF) 0.5 MG/ML IJ SOLN
INTRAMUSCULAR | Status: DC | PRN
  Administered 2023-05-25: 150 ug via INTRATHECAL

## 2023-05-25 MED ORDER — FENTANYL CITRATE (PF) 100 MCG/2ML IJ SOLN: 25.0000 ug | INTRAMUSCULAR | Status: DC | PRN

## 2023-05-25 MED ORDER — TRANEXAMIC ACID-NACL 1000-0.7 MG/100ML-% IV SOLN
1000.0000 mg | Freq: Once | INTRAVENOUS | Status: AC
  Administered 2023-05-25: 1000 mg via INTRAVENOUS

## 2023-05-25 SURGICAL SUPPLY — 35 items
BENZOIN TINCTURE PRP APPL 2/3 (GAUZE/BANDAGES/DRESSINGS) IMPLANT
CHLORAPREP W/TINT 26 (MISCELLANEOUS) ×2 IMPLANT
CLAMP UMBILICAL CORD (MISCELLANEOUS) ×1 IMPLANT
CLOTH BEACON ORANGE TIMEOUT ST (SAFETY) ×1 IMPLANT
DERMABOND ADVANCED .7 DNX12 (GAUZE/BANDAGES/DRESSINGS) ×2 IMPLANT
DRSG OPSITE POSTOP 4X10 (GAUZE/BANDAGES/DRESSINGS) ×1 IMPLANT
ELECT REM PT RETURN 9FT ADLT (ELECTROSURGICAL) ×1
ELECTRODE REM PT RTRN 9FT ADLT (ELECTROSURGICAL) ×1 IMPLANT
EXTRACTOR VACUUM KIWI (MISCELLANEOUS) ×1 IMPLANT
GAUZE SPONGE 4X4 12PLY STRL (GAUZE/BANDAGES/DRESSINGS) IMPLANT
GLOVE BIOGEL PI IND STRL 7.0 (GLOVE) ×2 IMPLANT
GLOVE BIOGEL PI IND STRL 7.5 (GLOVE) ×2 IMPLANT
GLOVE ECLIPSE 7.5 STRL STRAW (GLOVE) ×1 IMPLANT
GOWN STRL REUS W/TWL LRG LVL3 (GOWN DISPOSABLE) ×3 IMPLANT
KIT ABG SYR 3ML LUER SLIP (SYRINGE) IMPLANT
NDL HYPO 25X5/8 SAFETYGLIDE (NEEDLE) IMPLANT
NEEDLE HYPO 25X5/8 SAFETYGLIDE (NEEDLE)
NS IRRIG 1000ML POUR BTL (IV SOLUTION) ×1 IMPLANT
PACK C SECTION WH (CUSTOM PROCEDURE TRAY) ×1 IMPLANT
PAD ABD 8X10 STRL (GAUZE/BANDAGES/DRESSINGS) IMPLANT
PAD OB MATERNITY 4.3X12.25 (PERSONAL CARE ITEMS) ×1 IMPLANT
RTRCTR C-SECT PINK 25CM LRG (MISCELLANEOUS) ×1 IMPLANT
STRIP CLOSURE SKIN 1/2X4 (GAUZE/BANDAGES/DRESSINGS) IMPLANT
SUT MNCRL 0 VIOLET CTX 36 (SUTURE) ×2 IMPLANT
SUT MNCRL AB 3-0 PS2 27 (SUTURE) ×1 IMPLANT
SUT MNCRL AB 4-0 PS2 18 (SUTURE) ×1 IMPLANT
SUT VIC AB 0 CT1 27XBRD ANBCTR (SUTURE) ×1 IMPLANT
SUT VIC AB 0 CTX36XBRD ANBCTRL (SUTURE) ×1 IMPLANT
SUT VIC AB 2-0 CT1 TAPERPNT 27 (SUTURE) ×1 IMPLANT
SUT VIC AB 4-0 KS 27 (SUTURE) IMPLANT
SUT VIC AB 4-0 PS2 27 (SUTURE) ×1 IMPLANT
TAPE CLOTH SURG 4X10 WHT LF (GAUZE/BANDAGES/DRESSINGS) IMPLANT
TOWEL OR 17X24 6PK STRL BLUE (TOWEL DISPOSABLE) ×1 IMPLANT
TRAY FOLEY W/BAG SLVR 14FR LF (SET/KITS/TRAYS/PACK) ×1 IMPLANT
WATER STERILE IRR 1000ML POUR (IV SOLUTION) ×1 IMPLANT

## 2023-05-25 NOTE — Transfer of Care (Signed)
Immediate Anesthesia Transfer of Care Note  Patient: DAVEY MULLENAX  Procedure(s) Performed: CESAREAN SECTION POSSIBLE INTRAUTERINE DEVICE (IUD) INSERTION  Patient Location: PACU  Anesthesia Type:Spinal  Level of Consciousness: awake, alert , and oriented  Airway & Oxygen Therapy: Patient Spontanous Breathing  Post-op Assessment: Report given to RN and Post -op Vital signs reviewed and stable  Post vital signs: Reviewed and stable  Last Vitals:  Vitals Value Taken Time  BP 124/83 05/25/23 1219  Temp    Pulse 77 05/25/23 1222  Resp 23 05/25/23 1222  SpO2 97 % 05/25/23 1222  Vitals shown include unfiled device data.  Last Pain:  Vitals:   05/25/23 0843  TempSrc: Oral  PainSc: 0-No pain      Patients Stated Pain Goal: 5 (05/25/23 0843)  Complications: No notable events documented.

## 2023-05-25 NOTE — Anesthesia Postprocedure Evaluation (Signed)
Anesthesia Post Note  Patient: Donna Conrad  Procedure(s) Performed: CESAREAN SECTION POSSIBLE INTRAUTERINE DEVICE (IUD) INSERTION     Patient location during evaluation: PACU Anesthesia Type: MAC and Spinal Level of consciousness: awake and alert Pain management: pain level controlled Vital Signs Assessment: post-procedure vital signs reviewed and stable Respiratory status: spontaneous breathing, nonlabored ventilation and respiratory function stable Cardiovascular status: stable and blood pressure returned to baseline Postop Assessment: no apparent nausea or vomiting Anesthetic complications: no   No notable events documented.  Last Vitals:  Vitals:   05/25/23 1245 05/25/23 1300  BP: 120/77 122/75  Pulse: 69 65  Resp: 16 16  Temp:    SpO2: 99% 100%    Last Pain:  Vitals:   05/25/23 1219  TempSrc: Oral  PainSc: 0-No pain                 Ryker Sudbury

## 2023-05-25 NOTE — Op Note (Signed)
Donna Conrad PROCEDURE DATE: 05/25/2023  PREOPERATIVE DIAGNOSES: Intrauterine pregnancy at [redacted]w[redacted]d weeks gestation;  elective repeat (1 prior), worsening cHTN  POSTOPERATIVE DIAGNOSES: The same  PROCEDURE: Repeat Low Transverse Cesarean Section  SURGEON:  Dr. Harvie Bridge  ASSISTANT:  Dr. Emeline General  ANESTHESIOLOGY TEAM: Anesthesiologist: Val Eagle, MD CRNA: Graciela Husbands, CRNA  INDICATIONS: Donna Conrad is a 27 y.o. [phone] at [redacted]w[redacted]d here for cesarean section secondary to the indications listed under preoperative diagnoses; please see preoperative note for further details.  The risks of surgery were discussed with the patient including but were not limited to: bleeding which may require transfusion or reoperation; infection which may require antibiotics; injury to bowel, bladder, ureters or other surrounding organs; injury to the fetus; need for additional procedures including hysterectomy in the event of a life-threatening hemorrhage; formation of adhesions; placental abnormalities wth subsequent pregnancies; incisional problems; thromboembolic phenomenon and other postoperative/anesthesia complications.  The patient concurred with the proposed plan, giving informed written consent for the procedure.    FINDINGS:  Viable female infant in cephalic presentation.  Apgars 8 and 9.  Amniotic fluid: clear.  Intact placenta, three vessel cord.  Normal uterus, fallopian tubes and ovaries bilaterally.  ANESTHESIA: spinal INTRAVENOUS FLUIDS: 1000 ml   ESTIMATED BLOOD LOSS: 814 ml URINE OUTPUT:  75 ml SPECIMENS: Placenta sent to L&D  COMPLICATIONS: None immediate  PROCEDURE IN DETAIL:  The patient preoperatively received intravenous antibiotics and had sequential compression devices applied to her lower extremities.  She was then taken to the operating room where spinal anesthesia was found to be adequate. She was then placed in a dorsal supine position with a leftward tilt, and  prepped and draped in a sterile manner.  A foley catheter was  placed into her bladder and attached to constant gravity.  After an adequate timeout was performed, a Pfannenstiel skin incision was made with scalpel and carried through to the underlying layer of fascia. The fascia was incised in the midline, and this incision was extended bluntly. The rectus muscles were separated in the midline and the peritoneum was entered bluntly.   The Alexis self-retaining retractor was introduced into the abdominal cavity.  Attention was turned to the lower uterine segment where a low transverse hysterotomy was made with a scalpel and extended bilaterally bluntly.  The infant was successfully delivered, the cord was clamped and cut after one minute, and the infant was handed over to the awaiting neonatology team. Uterine massage was then administered, and the placenta delivered intact with a three-vessel cord. The uterus was then cleared of clots and debris.  The hysterotomy was closed with 0 Monocryl in a running fashion.  A second layer of 0-monocryl was placed to achieve hemostasis  The pelvis was cleared of all clot and debris. Hemostasis was confirmed on all surfaces.  The retractor was removed.  The peritoneum was closed with a 2-0 Vicryl running stitch. The fascia was then closed using 0 Vicryl in a running fashion.  The subcutaneous layer was irrigated, any areas of bleeding were cauterized with the bovie and was reapproximated with 2-0 plain gut in a running fashion. The skin was closed with a 4-0 monocryl subcuticular stitch. The patient tolerated the procedure well. Sponge, instrument and needle counts were correct x 3.  She was taken to the recovery room in stable condition.   Harvie Bridge, MD Obstetrician & Gynecologist, [facility] for [employer], [facility]

## 2023-05-25 NOTE — H&P (Signed)
Obstetric Preoperative History and Physical  Donna Conrad is a 27 y.o. G2P0101 with IUP at [redacted]w[redacted]d presenting for scheduled cesarean section.  Reports good fetal movement, no bleeding, no contractions, no leaking of fluid.  No acute preoperative concerns.    Cesarean Section Indication:  elective repeat, chronic hypertension  Prenatal Course Source of Care: [facility] with onset of care at 12 weeks Pregnancy complications or risks: Patient Active Problem List   Diagnosis Date Noted   Intrauterine normal pregnancy 05/25/2023   Pica 04/02/2023   Anemia affecting pregnancy in third trimester 03/28/2023   Major depressive disorder, recurrent episode, moderate (HCC) 03/12/2023   Generalized anxiety disorder 03/12/2023   PTSD (post-traumatic stress disorder) 03/12/2023   Psychophysiologic insomnia 03/12/2023   Pregnant 03/12/2023   Cannabis use disorder 03/12/2023   Carrier of spinal muscular atrophy 01/01/2023   Herpes 12/05/2022   Prediabetes 12/04/2022   History of cesarean delivery, currently pregnant 12/01/2022   History of pre-eclampsia in prior pregnancy, currently pregnant 11/30/2022   History of HELLP syndrome, currently pregnant 11/30/2022   Supervision of high-risk pregnancy 11/30/2022   Chronic hypertension affecting pregnancy 10/13/2022   She plans to breast and formula feed She desires IUD for postpartum contraception.   Prenatal labs and studies: ABO, Rh: --/--/O POS (12/18 1610) Antibody: NEG (12/18 0917) Rubella: 1.76 (06/28 0956) RPR: NON REACTIVE (12/18 0914)  HBsAg: Negative (06/28 0956)  HIV: Non Reactive (10/11 0847)  RUE:AVWUJWJX/-- (12/06 0339) 2 hr Glucola  wnl Genetic screening normal Anatomy US normal  Prenatal Transfer Tool  Maternal Diabetes: No Genetic Screening: Normal Maternal Ultrasounds/Referrals: Normal Fetal Ultrasounds or other Referrals:  None Maternal Substance Abuse:  No Significant Maternal Medications:  Meds include: Other:  Aspirin, Albuterol, Procardia, Omeprazole, Valtrex Significant Maternal Lab Results: Group B Strep positive  Past Medical History:  Diagnosis Date   HSV infection    Pre-eclampsia     Past Surgical History:  Procedure Laterality Date   CESAREAN SECTION N/A 11/19/2014   Procedure: CESAREAN SECTION;  Surgeon: Sherian Rein, MD;  Location: [facility];  Service: Obstetrics;  Laterality: N/A;   WISDOM TOOTH EXTRACTION      OB History  Gravida Para Term Preterm AB Living  2 1  1  1   SAB IAB Ectopic Multiple Live Births     0 1    # Outcome Date GA Lbr Len/2nd Weight Sex Type Anes PTL Lv  2 Current           1 Preterm 11/19/14 [redacted]w[redacted]d  1520 g F CS-LTranv Spinal  LIV     Complications: Severe pre-eclampsia, HELLP syndrome    Social History   Socioeconomic History   Marital status: Single    Spouse name: Not on file   Number of children: Not on file   Years of education: Not on file   Highest education level: Not on file  Occupational History   Occupation: Engineer, agricultural at [employer]  Tobacco Use   Smoking status: Former    Types: Cigarettes   Smokeless tobacco: Never   Tobacco comments:    Pt reports smoking only some days, but has stopped since finding out she was pregnant.   Vaping Use   Vaping status: Never Used  Substance and Sexual Activity   Alcohol use: Not Currently    Comment: Last drink in February 2024.  1 person a bottle of gray goose vodka at a time   Drug use: Yes    Types: Marijuana  Comment: Couple hits of a joint last use in early October 2024   Sexual activity: Yes    Birth control/protection: None  Other Topics Concern   Not on file  Social History Narrative   Not on file   Social Drivers of Health   Financial Resource Strain: High Risk (12/01/2022)   Overall Financial Resource Strain (CARDIA)    Difficulty of Paying Living Expenses: Hard  Food Insecurity: Food Insecurity Present (12/01/2022)   Hunger Vital Sign     Worried About Running Out of Food in the Last Year: Sometimes true    Ran Out of Food in the Last Year: Sometimes true  Transportation Needs: No Transportation Needs (12/01/2022)   PRAPARE - Administrator, Civil Service (Medical): No    Lack of Transportation (Non-Medical): No  Physical Activity: Insufficiently Active (12/01/2022)   Exercise Vital Sign    Days of Exercise per Week: 2 days    Minutes of Exercise per Session: 20 min  Stress: Stress Concern Present (12/01/2022)   [employer] of Occupational Health - Occupational Stress Questionnaire    Feeling of Stress : Very much  Social Connections: Socially Isolated (12/01/2022)   Social Connection and Isolation Panel [NHANES]    Frequency of Communication with Friends and Family: Three times a week    Frequency of Social Gatherings with Friends and Family: Three times a week    Attends Religious Services: Never    Active Member of Clubs or Organizations: No    Attends Engineer, structural: Never    Marital Status: Never married    Family History  Problem Relation Age of Onset   Asthma Mother    Diabetes Mother    Hypertension Mother    Miscarriages / [place] Mother    Stroke Mother    Bipolar disorder Mother    Asthma Father    Depression Father    Stroke Father    Diabetes Sister    Mental illness Sister    Miscarriages / Stillbirths Sister    Bipolar disorder Sister    Diabetes Brother    Asthma Daughter    Diabetes Maternal Aunt     Facility-Administered Medications Prior to Admission  Medication Dose Route Frequency Provider Last Rate Last Admin   albuterol (PROVENTIL) (2.5 MG/3ML) 0.083% nebulizer solution 2.5 mg  2.5 mg Nebulization Once PRN Cresenzo-Dishmon, Scarlette Calico, CNM       diphenhydrAMINE (BENADRYL) injection 25 mg  25 mg Intravenous Once PRN Cresenzo-Dishmon, Scarlette Calico, CNM       EPINEPHrine (ADRENALIN) 0.3 mg  0.3 mg Intramuscular Once PRN Cresenzo-Dishmon, Scarlette Calico, CNM        methylPREDNISolone sodium succinate (SOLU-MEDROL) 125 mg in sodium chloride 0.9 % 50 mL IVPB  125 mg Intravenous Once PRN Cresenzo-Dishmon, Scarlette Calico, CNM       sodium chloride 0.9 % bolus 500 mL  500 mL Intravenous Once PRN Cresenzo-Dishmon, Scarlette Calico, CNM       sodium chloride flush (NS) 0.9 % injection 3 mL  3 mL Intravenous PRN Cresenzo-Dishmon, Scarlette Calico, CNM       Medications Prior to Admission  Medication Sig Dispense Refill Last Dose/Taking   acetaminophen (TYLENOL) 500 MG tablet Take 1,000 mg by mouth every 6 (six) hours as needed for moderate pain (pain score 4-6).   05/24/2023 at  8:00 AM   aspirin EC 81 MG tablet Take 2 tablets (162 mg total) by mouth daily. Take after 12 weeks for prevention of preeclampsia later in pregnancy (Patient  taking differently: Take 81 mg by mouth daily. Take after 12 weeks for prevention of preeclampsia later in pregnancy) 300 tablet 2 05/24/2023 at  8:00 AM   NIFEdipine (PROCARDIA XL/NIFEDICAL XL) 60 MG 24 hr tablet Take 1 tablet (60 mg total) by mouth daily. 30 tablet 6 05/24/2023 at  8:00 AM   omeprazole (PRILOSEC) 20 MG capsule Take 1 capsule (20 mg total) by mouth daily. 1 tablet a day 30 capsule 6 05/24/2023 at  8:00 AM   Phenyleph-Pyril-DM-APAP (ROBITUSSIN NIGHT RELIEF PO) Take 1 Dose by mouth at bedtime as needed (congestion).   05/24/2023 at  8:00 AM   Prenatal Vit-Fe Fumarate-FA (PRENATAL VITAMIN PO) Take 1 tablet by mouth 2 (two) times daily.   05/24/2023 at  8:00 AM   SODIUM FLUORIDE 5000 PPM 1.1 % PSTE Take 1 Application by mouth daily.   Taking   valACYclovir (VALTREX) 500 MG tablet Take 1 tablet (500 mg total) by mouth 2 (two) times daily. (Patient taking differently: Take 500 mg by mouth daily.) 60 tablet 6 05/24/2023 at  8:00 AM   Blood Pressure Monitor MISC For regular home bp monitoring during pregnancy 1 each 0     No Known Allergies  Review of Systems: Pertinent items noted in HPI and remainder of comprehensive ROS otherwise  negative.  Physical Exam: BP (!) 138/94   Pulse 83   Temp 98.4 F (36.9 C) (Oral)   Resp 18   Ht 5\' 3"  (1.6 m)   Wt 90.8 kg   LMP 09/04/2022 (Exact Date)   SpO2 100%   BMI 35.45 kg/m  FHR by Doppler: 135 bpm CONSTITUTIONAL: Well-developed, well-nourished female in no acute distress.  HENT:  Normocephalic, atraumatic, External right and left ear normal. Oropharynx is clear and moist EYES: Conjunctivae and EOM are normal. Pupils are equal, round, and reactive to light. No scleral icterus.  NECK: Normal range of motion, supple, no masses SKIN: Skin is warm and dry. No rash noted. Not diaphoretic. No erythema. No pallor. NEUROLOGIC: Alert and oriented to person, place, and time. Normal reflexes, muscle tone coordination. No cranial nerve deficit noted. PSYCHIATRIC: Normal mood and affect. Normal behavior. Normal judgment and thought content. CARDIOVASCULAR: Normal heart rate noted, regular rhythm RESPIRATORY: Effort and breath sounds normal, no problems with respiration noted ABDOMEN: Soft, nontender, nondistended, gravid. Well-healed Pfannenstiel incision. PELVIC: Deferred MUSCULOSKELETAL: Normal range of motion. No edema and no tenderness. 2+ distal pulses.  Pertinent Labs/Studies:   Results for orders placed or performed during the hospital encounter of 05/23/23 (from the past 72 hours)  CBC     Status: Abnormal   Collection Time: 05/23/23  9:14 AM  Result Value Ref Range   WBC 4.9 4.0 - 10.5 K/uL   RBC 3.72 (L) 3.87 - 5.11 MIL/uL   Hemoglobin 10.7 (L) 12.0 - 15.0 g/dL   HCT 16.1 (L) 09.6 - 04.5 %   MCV 88.7 80.0 - 100.0 fL   MCH 28.8 26.0 - 34.0 pg   MCHC 32.4 30.0 - 36.0 g/dL   RDW 40.9 (H) 81.1 - 91.4 %   Platelets 232 150 - 400 K/uL   nRBC 0.0 0.0 - 0.2 %    Comment: Performed at [facility], [address], [place], [place] [address]  RPR     Status: None   Collection Time: 05/23/23  9:14 AM  Result Value Ref Range   RPR Ser Ql NON REACTIVE NON REACTIVE     Comment: Performed at [facility], [address], [place], [place] [address]  Type and screen     Status: None   Collection Time: 05/23/23  9:17 AM  Result Value Ref Range   ABO/RH(D) O POS    Antibody Screen NEG    Sample Expiration      05/26/2023,2359 Performed at [facility], [address], [place], [place] [address]     Assessment and Plan: Donna Conrad is a 27 y.o. G2P0101 at [redacted]w[redacted]d being admitted for scheduled cesarean section and postplacental IUD. The risks of surgery were discussed with the patient including but were not limited to: bleeding which may require transfusion or reoperation; infection which may require antibiotics; injury to bowel, bladder, ureters or other surrounding organs; injury to the fetus; need for additional procedures including hysterectomy in the event of a life-threatening hemorrhage; formation of adhesions; placental abnormalities wth subsequent pregnancies; incisional problems; thromboembolic phenomenon and other postoperative/anesthesia complications. The patient concurred with the proposed plan, giving informed written consent for the procedure. Patient has been NPO since last night she will remain NPO for procedure. Anesthesia and OR aware. Preoperative prophylactic antibiotics and SCDs ordered on call to the OR. To OR when ready.    Sundra Aland, MD FMOB Fellow, Faculty practice [facility], Center for [facility] 05/25/23  9:03 AM

## 2023-05-25 NOTE — Anesthesia Preprocedure Evaluation (Signed)
Anesthesia Evaluation  Patient identified by MRN, date of birth, ID band Patient awake    Reviewed: Allergy & Precautions, NPO status , Patient's Chart, lab work & pertinent test results  History of Anesthesia Complications Negative for: history of anesthetic complications  Airway Mallampati: II  TM Distance: >3 FB Neck ROM: Full    Dental  (+) Teeth Intact, Dental Advisory Given   Pulmonary neg shortness of breath, neg sleep apnea, neg COPD, Recent URI , Residual Cough, former smoker   breath sounds clear to auscultation       Cardiovascular hypertension, Pt. on medications (-) angina (-) Past MI and (-) CHF  Rhythm:Regular     Neuro/Psych   Anxiety Depression       GI/Hepatic negative GI ROS, Neg liver ROS,,,  Endo/Other  negative endocrine ROS    Renal/GU negative Renal ROS     Musculoskeletal negative musculoskeletal ROS (+)    Abdominal   Peds  Hematology Lab Results      Component                Value               Date                      WBC                      4.9                 05/23/2023                HGB                      10.7 (L)            05/23/2023                HCT                      33.0 (L)            05/23/2023                MCV                      88.7                05/23/2023                PLT                      232                 05/23/2023          \    Anesthesia Other Findings   Reproductive/Obstetrics (+) Pregnancy                             Anesthesia Physical Anesthesia Plan  ASA: 2  Anesthesia Plan: MAC and Spinal   Post-op Pain Management:    Induction: Intravenous  PONV Risk Score and Plan: 2 and Treatment may vary due to age or medical condition, Ondansetron and Dexamethasone  Airway Management Planned: Nasal Cannula, Natural Airway and Simple Face Mask  Additional Equipment: None  Intra-op Plan:   Post-operative Plan:    Informed Consent: I have reviewed  the patients History and Physical, chart, labs and discussed the procedure including the risks, benefits and alternatives for the proposed anesthesia with the patient or authorized representative who has indicated his/her understanding and acceptance.     Dental advisory given  Plan Discussed with: CRNA  Anesthesia Plan Comments:        Anesthesia Quick Evaluation

## 2023-05-25 NOTE — Discharge Summary (Signed)
Postpartum Discharge Summary  Patient Name: Donna Conrad DOB: Apr 23, 1996 MRN: [id number]  Date of admission: 05/25/2023 Delivery date:05/25/2023 Delivering provider: Lennart Pall Date of discharge: 05/27/2023  Admitting diagnosis: Intrauterine normal pregnancy [Z34.90] Intrauterine pregnancy: [redacted]w[redacted]d     Secondary diagnosis:  Active Problems:   Chronic hypertension affecting pregnancy   Maternal care due to low transverse uterine scar from previous cesarean delivery   S/P cesarean section  Additional problems: Chronic hypertension, prior CS, HSV, anemia, PTSD/anxiety/depression   Discharge diagnosis: Term Pregnancy Delivered, CHTN, and Anemia                                              Post partum procedures: none Augmentation: N/A Complications: None  Hospital course: Sceduled C/S   27 y.o. yo [id number] at [redacted]w[redacted]d was admitted to the hospital 05/25/2023 for scheduled cesarean section with the following indication:Elective Repeat.Delivery details are as follows:  Membrane Rupture Time/Date: 10:55 AM,05/25/2023  Delivery Method:C-Section, Low Transverse Operative Delivery:N/A Details of operation can be found in separate operative note.  Patient had a postpartum course complicated by cough and anemia.  She is ambulating, tolerating a regular diet, passing flatus, and urinating well. Patient is discharged home in stable condition on  05/27/23.         Newborn Data: Birth date:05/25/2023 Birth time:10:58 AM Gender:Female Living status:Living Apgars:8 ,9  Weight:3070 g    Magnesium Sulfate received: No BMZ received: No Rhophylac:N/A MMR:N/A T-DaP:Given prenatally Flu: No RSV Vaccine received: No Transfusion:No  Immunizations received: Immunization History  Administered Date(s) Administered   Influenza,inj,Quad PF,6+ Mos 02/01/2017   Tdap 11/20/2014, 04/20/2023    Physical exam  Vitals:   05/26/23 0433 05/26/23 1422 05/26/23 2204 05/27/23 0620  BP: 119/76  117/65 126/76 125/73  Pulse: 60 84 73 91  Resp: 18 17 18 18   Temp: 97.7 F (36.5 C) 98 F (36.7 C) 98.1 F (36.7 C) 97.7 F (36.5 C)  TempSrc: Oral Oral Oral Oral  SpO2:  100% 99% 100%  Weight:      Height:       General: alert, cooperative, and no distress Lochia: appropriate Uterine Fundus: firm Incision: Dressing is clean, dry, and intact DVT Evaluation: No evidence of DVT seen on physical exam. Labs: Lab Results  Component Value Date   WBC 12.8 (H) 05/26/2023   HGB 8.4 (L) 05/26/2023   HCT 24.4 (L) 05/26/2023   MCV 85.6 05/26/2023   PLT 225 05/26/2023      Latest Ref Rng & Units 05/16/2023    6:55 AM  CMP  Glucose 70 - 99 mg/dL 85   BUN 6 - 20 mg/dL 5   Creatinine 4.69 - 6.29 mg/dL 5.28   Sodium 413 - 244 mmol/L 134   Potassium 3.5 - 5.1 mmol/L 3.6   Chloride 98 - 111 mmol/L 107   CO2 22 - 32 mmol/L 18   Calcium 8.9 - 10.3 mg/dL 8.7   Total Protein 6.5 - 8.1 g/dL 5.7   Total Bilirubin <0.1 mg/dL 0.3   Alkaline Phos 38 - 126 U/L 89   AST 15 - 41 U/L 19   ALT 0 - 44 U/L 11    Edinburgh Score:    05/27/2023   10:39 AM  Edinburgh Postnatal Depression Scale Screening Tool  I have been able to laugh and see the funny side of  things. 0  I have looked forward with enjoyment to things. 0  I have blamed myself unnecessarily when things went wrong. 2  I have been anxious or worried for no good reason. 2  I have felt scared or panicky for no good reason. 2  Things have been getting on top of me. 1  I have been so unhappy that I have had difficulty sleeping. 0  I have felt sad or miserable. 1  I have been so unhappy that I have been crying. 0  The thought of harming myself has occurred to me. 0  Edinburgh Postnatal Depression Scale Total 8   Edinburgh Postnatal Depression Scale Total: 8   After visit meds:  Allergies as of 05/27/2023   No Known Allergies      Medication List     STOP taking these medications    aspirin EC 81 MG tablet       TAKE  these medications    acetaminophen 500 MG tablet Commonly known as: TYLENOL Take 1,000 mg by mouth every 6 (six) hours as needed for moderate pain (pain score 4-6).   Blood Pressure Monitor Misc For regular home bp monitoring during pregnancy   dextromethorphan 30 MG/5ML liquid Commonly known as: DELSYM Take 5 mLs (30 mg total) by mouth 2 (two) times daily.   fluticasone 50 MCG/ACT nasal spray Commonly known as: FLONASE Place 2 sprays into both nostrils daily. Start taking on: May 28, 2023   furosemide 20 MG tablet Commonly known as: LASIX Take 1 tablet (20 mg total) by mouth daily. Start taking on: May 28, 2023   ibuprofen 600 MG tablet Commonly known as: ADVIL Take 1 tablet (600 mg total) by mouth every 6 (six) hours.   iron polysaccharides 150 MG capsule Commonly known as: NIFEREX Take 1 capsule (150 mg total) by mouth every other day. Start taking on: May 28, 2023   magnesium oxide 400 (240 Mg) MG tablet Commonly known as: MAG-OX Take 0.5 tablets (200 mg total) by mouth daily. Start taking on: May 28, 2023   menthol-cetylpyridinium 3 MG lozenge Commonly known as: CEPACOL Take 1 lozenge (3 mg total) by mouth every 2 (two) hours as needed for sore throat.   NIFEdipine 60 MG 24 hr tablet Commonly known as: PROCARDIA XL/NIFEDICAL XL Take 1 tablet (60 mg total) by mouth daily.   omeprazole 20 MG capsule Commonly known as: PRILOSEC Take 1 capsule (20 mg total) by mouth daily. 1 tablet a day   oxyCODONE 5 MG immediate release tablet Commonly known as: Oxy IR/ROXICODONE Take 1 tablet (5 mg total) by mouth every 6 (six) hours as needed for up to 5 days for moderate pain (pain score 4-6).   PRENATAL VITAMIN PO Take 1 tablet by mouth 2 (two) times daily.   ROBITUSSIN NIGHT RELIEF PO Take 1 Dose by mouth at bedtime as needed (congestion).   Sodium Fluoride 5000 PPM 1.1 % Pste Generic drug: Sodium Fluoride Take 1 Application by mouth daily.    valACYclovir 500 MG tablet Commonly known as: VALTREX Take 1 tablet (500 mg total) by mouth 2 (two) times daily. What changed: when to take this         Discharge home in stable condition Infant Feeding: Bottle and Breast Infant Disposition:home with mother Discharge instruction: per After Visit Summary and Postpartum booklet. Activity: Advance as tolerated. Pelvic rest for 6 weeks.  Diet: routine diet Future Appointments: Future Appointments  Date Time Provider Department Center  05/31/2023 11:10  AM Lazaro Arms, MD [facility]   Follow up Visit:  Message sent to [facility] by [facility] 05/25/23  Please schedule this patient for a In person postpartum visit in 6 weeks with the following provider: Any provider. With IUD insertion Additional Postpartum F/U:Postpartum Depression checkup, Incision check 1 week, and BP check 1 week  High risk pregnancy complicated by: HTN and anemia, prior CS, anxiety/depression/PTSD Delivery mode:  C-Section, Low Transverse Anticipated Birth Control:  IUD at postpartum visit   05/27/2023 Donna Conrad, CNM

## 2023-05-25 NOTE — Lactation Note (Signed)
This note was copied from a baby's chart. Lactation Consultation Note  Patient Name: Donna Conrad NWGNF'A Date: 05/25/2023 Age:25 hours Reason for consult: Initial assessment;Early term 37-38.6wks Assisted baby to the breast. Mom stated she has latched baby but it hurts. Mom is BF and formula feeding. Mom was latching w/baby swaddled. Encouraged STS during BF and it helps obtain deeper latch as well. Mom wanted to know if she has colostrum in the Lt. Breast because she can get colostrum from the Rt. Breast but not from the Lt. Breast. LC hand expressed easily colostrum from Lt. Breast. Praised mom. Placed baby on the Lt. Breast d/t cueing. Mom loved the football hold and it was comfortable baby wasn't on her abd. Baby is able to obtain deep latch. Discussed body alignment, support, props, STS, I&O, BF before giving formula. Mom encouraged to feed baby 8-12 times/24 hours and with feeding cues.  Mom appreciated assistance. Encouraged to call for assistance as needed.   Maternal Data Has patient been taught Hand Expression?: Yes Does the patient have breastfeeding experience prior to this delivery?: No  Feeding    LATCH Score Latch: Grasps breast easily, tongue down, lips flanged, rhythmical sucking.  Audible Swallowing: A few with stimulation  Type of Nipple: Everted at rest and after stimulation  Comfort (Breast/Nipple): Soft / non-tender  Hold (Positioning): Assistance needed to correctly position infant at breast and maintain latch.  LATCH Score: 8   Lactation Tools Discussed/Used Tools: Pump Breast pump type: Manual Reason for Pumping: mom requested from RN  Interventions Interventions: Breast feeding basics reviewed;Assisted with latch;Skin to skin;Breast massage;Hand express;Breast compression;Adjust position;Support pillows;Position options;Education;LC Services brochure  Discharge    Consult Status Consult Status: Follow-up Date: 05/26/23 Follow-up  type: In-patient    Charyl Dancer 05/25/2023, 10:07 PM

## 2023-05-25 NOTE — Anesthesia Procedure Notes (Signed)
Spinal  Patient location during procedure: OR Start time: 05/25/2023 10:22 AM End time: 05/25/2023 10:28 AM Reason for block: surgical anesthesia Staffing Performed: anesthesiologist  Anesthesiologist: Val Eagle, MD Performed by: Val Eagle, MD Authorized by: Val Eagle, MD   Preanesthetic Checklist Completed: patient identified, IV checked, risks and benefits discussed, surgical consent, monitors and equipment checked, pre-op evaluation and timeout performed Spinal Block Patient position: sitting Prep: DuraPrep Patient monitoring: heart rate, cardiac monitor, continuous pulse ox and blood pressure Approach: midline Location: L3-4 Injection technique: single-shot Needle Needle type: Pencan  Needle gauge: 24 G Needle length: 9 cm Assessment Sensory level: T6 Events: CSF return

## 2023-05-26 LAB — CBC
HCT: 24.4 % — ABNORMAL LOW (ref 36.0–46.0)
Hemoglobin: 8.4 g/dL — ABNORMAL LOW (ref 12.0–15.0)
MCH: 29.5 pg (ref 26.0–34.0)
MCHC: 34.4 g/dL (ref 30.0–36.0)
MCV: 85.6 fL (ref 80.0–100.0)
Platelets: 225 10*3/uL (ref 150–400)
RBC: 2.85 MIL/uL — ABNORMAL LOW (ref 3.87–5.11)
RDW: 18.7 % — ABNORMAL HIGH (ref 11.5–15.5)
WBC: 12.8 10*3/uL — ABNORMAL HIGH (ref 4.0–10.5)
nRBC: 0 % (ref 0.0–0.2)

## 2023-05-26 MED ORDER — DEXTROMETHORPHAN POLISTIREX ER 30 MG/5ML PO SUER
30.0000 mg | Freq: Two times a day (BID) | ORAL | Status: DC
  Administered 2023-05-26 – 2023-05-27 (×3): 30 mg via ORAL
  Filled 2023-05-26 (×4): qty 5

## 2023-05-26 MED ORDER — MAGNESIUM OXIDE -MG SUPPLEMENT 400 (240 MG) MG PO TABS
200.0000 mg | ORAL_TABLET | Freq: Every day | ORAL | Status: DC
  Administered 2023-05-26 – 2023-05-27 (×2): 200 mg via ORAL
  Filled 2023-05-26 (×2): qty 1

## 2023-05-26 MED ORDER — POLYSACCHARIDE IRON COMPLEX 150 MG PO CAPS
150.0000 mg | ORAL_CAPSULE | ORAL | Status: DC
  Administered 2023-05-26: 150 mg via ORAL
  Filled 2023-05-26: qty 1

## 2023-05-26 MED ORDER — FLUTICASONE PROPIONATE 50 MCG/ACT NA SUSP
2.0000 | Freq: Every day | NASAL | Status: DC
  Administered 2023-05-26 – 2023-05-27 (×2): 2 via NASAL
  Filled 2023-05-26: qty 16

## 2023-05-26 NOTE — Lactation Note (Signed)
This note was copied from a baby's chart. Lactation Consultation Note  Patient Name: Donna Conrad WGNFA'O Date: 05/26/2023 Age:25 hours Reason for consult: Follow-up assessment;Early term 25-38.6wks Baby was on the breast when LC came into rm. Baby had a good latch. Mom is still supplementing w/formula after BF. Praised mom for good feeding. Mom stated sometimes it still hurts when latching. Suggested flanging lips wider. Mom stated helpful. Encouraged to call for assistance as needed.  Maternal Data    Feeding Mother's Current Feeding Choice: Breast Milk and Formula Nipple Type: Slow - flow  LATCH Score Latch: Grasps breast easily, tongue down, lips flanged, rhythmical sucking.  Audible Swallowing: A few with stimulation  Type of Nipple: Everted at rest and after stimulation  Comfort (Breast/Nipple): Soft / non-tender  Hold (Positioning): No assistance needed to correctly position infant at breast.  LATCH Score: 9   Lactation Tools Discussed/Used    Interventions Interventions: Breast feeding basics reviewed  Discharge    Consult Status Consult Status: Follow-up Date: 03/27/23 Follow-up type: In-patient    Charyl Dancer 05/26/2023, 9:56 PM

## 2023-05-26 NOTE — Progress Notes (Signed)
POSTPARTUM PROGRESS NOTE  Post Partum Day 1  Subjective:  Donna Conrad is a 27 y.o. [id number] s/p LTCS at [redacted]w[redacted]d.  No acute events overnight.  Pt denies problems with ambulating, voiding or po intake.  She denies nausea or vomiting.  Pain is well controlled.  She has had flatus. She has not had bowel movement.  Lochia Moderate. She reports a "loose cough" and requests cough medicine. Additionally, she requests iron PO.   Objective: Blood pressure 119/76, pulse 60, temperature 97.7 F (36.5 C), temperature source Oral, resp. rate 18, height 5\' 3"  (1.6 m), weight 90.8 kg, last menstrual period 09/04/2022, SpO2 100%, unknown if currently breastfeeding.  Physical Exam:  General: alert, cooperative and no distress Chest: no respiratory distress Heart:regular rate, distal pulses intact Abdomen: soft, nontender,  Uterine Fundus: firm, appropriately tender DVT Evaluation: No calf swelling or tenderness Extremities: trace edema Skin: warm, dry; incision clean/dry/intact scant drainage noted.   Recent Labs    05/26/23 0509  HGB 8.4*  HCT 24.4*    Assessment/Plan: IZSABELLA Conrad is a 27 y.o. [id number] s/p LTCS at [redacted]w[redacted]d  Orders for Delsym, fluticasone, iron, mag ox placed.   PPD# 1 - Doing well Contraception: IUD interval Feeding: BF/Btl Dispo: Plan for discharge tomorrow 05/27/23.   LOS: 1 day   Richardson Landry, CNM 05/26/2023, 11:48 AM

## 2023-05-26 NOTE — Clinical Social Work Maternal (Addendum)
CLINICAL SOCIAL WORK MATERNAL/CHILD NOTE  Patient Details  Name: Donna Conrad MRN: [id number] Date of Birth:   Date:  05-19-2021  Clinical Social Worker Initiating Note:  Donna Conrad, LCSWA Date/Time: Initiated:  05/26/23/1704     Child's Name:  Donna Conrad   Biological Parents:  Mother, Father (FOB: Donna Conrad DOB: 03/26/1996)   Need for Interpreter:  None   Reason for Referral:  Current Substance Use/Substance Use During Pregnancy  , Behavioral Health Concerns   Address:  [address] [place] [address]    Phone number:  [phone] (home)     Additional phone number:   Household Members/Support Persons (HM/SP):   Household Member/Support Person 1   HM/SP Name Relationship DOB or Age  HM/SP -1 Donna Conrad Daughter 11/19/2014  HM/SP -2        HM/SP -3        HM/SP -4        HM/SP -5        HM/SP -6        HM/SP -7        HM/SP -8          Natural Supports (not living in the home):  Immediate Family, Extended Family   Professional Supports: Other (Comment) (Integrated Behavioral Health Therapist Hydrologist at [facility]))   Employment: Part-time   Type of Work: Engineer, agricultural   Education:  9 to 11 years (10th grade)   Homebound arranged:    Surveyor, quantity Resources:  Medicaid   Other Resources:  Sales executive  , [employer]   Cultural/Religious Considerations Which May Impact Care:  None identified  Strengths:  Ability to meet basic needs  , Home prepared for child  , Pediatrician chosen   Psychotropic Medications:         Pediatrician:    [place]  Pediatrician List:   [facility]    [place]    [place] Other [phone] Pediatrics)  [facility]      Pediatrician Fax Number:    Risk Factors/Current Problems:  Substance Use  , Mental Health Concerns     Cognitive State:  Linear Thinking  , Able to Concentrate  , Alert  , Goal  Oriented     Mood/Affect:  Calm  , Comfortable  , Relaxed  , Interested     CSW Assessment: CSW was consulted due to documented use of marijuana and anxiety/depression symptoms during pregnancy. CSW met with MOB at bedside to complete assessment. When CSW entered room, MOB was observed laying in hospital bed. Two visitors were present holding infant. CSW introduced self and requested to speak with MOB alone. Visitors left room. CSW explained reason for consult. MOB presented as calm, was agreeable to consult and remained engaged throughout encounter.   CSW confirmed demographic information on file. CSW inquired about MOB's mental health history. MOB reports symptoms of depression and anxiety during pregnancy, stating she was officially diagnosed with both depression and anxiety about 8 months ago while pregnant with infant. MOB shares that she has met with Integrated Behavioral Health (IBH) therapist Donna Conrad several times during her pregnancy virtually through [facility] but has not met with her in the past few months. Patient requests a referral to Integrated Behavioral Health to follow up with Donna Conrad, CSW placed referral. Patient verbalizes understanding that the appointment will be virtual. CSW also provided additional crisis  and outpatient mental health resources. CSW inquired about recent mental health symptoms. MOB shares that she initially felt "worried" prior to infant arriving, but since infant's birth, she reports feeling okay. MOB also reports recent financial stress. CSW provided [facility] [employer].   MOB reports she was prescribed Zoloft through [facility] during pregnancy but the medication made her feel sick so she discontinued it. MOB states she was then referred to outpatient psychiatry for medication management.  MOB states she cannot recall the name of the psychiatrist or clinic, but reports that she did attend an initial appointment during  pregnancy. MOB decided to wait until after giving birth to try a different psychotropic medication under psychiatric care and states she plans to follow up with psychiatry once she is discharged home. MOB identified her sisters, brother, and aunt as supports. MOB denied current SI/HI/DV.  CSW provided education regarding the baby blues period vs. perinatal mood disorders, discussed treatment and gave resources for mental health follow up if concerns arise.  CSW recommends self-evaluation during the postpartum time period using the New Mom Checklist from Postpartum Progress and encouraged MOB to contact a medical professional if symptoms are noted at any time.    MOB reports she has all needed items for infant, including a car seat and bassinet. MOB has chosen [employer] for infant's follow up care.  CSW informed MOB about hospital drug screen policy due to documented marijuana use during pregnancy. CSW explained that infant's UDS and CDS would be monitored and a CPS report would be made if warranted. MOB expressed understanding. CSW inquired about substance use during pregnancy. MOB reports that she smoked marijuana during pregnancy, reporting her last use in October. When asked why MOB smoked marijuana, MOB did not have a specific reason to report. MOB denied using other illicit substances during pregnancy. MOB denied transportation barriers. CSW inquired about prior CPS involvement. MOB reports a CPS case was opened regarding her daughter Donna Conrad last year when her daughter presented to school with a bruise on her about 1 year ago (2023). MOB reports that the bruise happened accidentally when her daughter was playing with a friend and no abuse or neglect was substantiated. MOB reports the CPS case was closed last year and denies additional CPS history.  CSW provided review of Sudden Infant Death Syndrome (SIDS) precautions.    CSW identifies no further need for intervention and no  barriers to discharge at this time.   CSW Plan/Description:  No Further Intervention Required/No Barriers to Discharge, Sudden Infant Death Syndrome (SIDS) Education, Perinatal Mood and Anxiety Disorder (PMADs) Education, Hospital Drug Screen Policy Information, Other Information/Referral to [employer], CSW Will Continue to Monitor Umbilical Cord Tissue Drug Screen Results and Make Report if Reggie Pile, LCSWA September 28, 2022, 5:07 PM

## 2023-05-27 MED ORDER — DEXTROMETHORPHAN POLISTIREX ER 30 MG/5ML PO SUER: 30.0000 mg | Freq: Two times a day (BID) | ORAL | 0 refills | Status: DC

## 2023-05-27 MED ORDER — IBUPROFEN 600 MG PO TABS: 600.0000 mg | ORAL_TABLET | Freq: Four times a day (QID) | ORAL | 0 refills | Status: DC

## 2023-05-27 MED ORDER — FLUTICASONE PROPIONATE 50 MCG/ACT NA SUSP: 2.0000 | Freq: Every day | NASAL | 2 refills | Status: AC

## 2023-05-27 MED ORDER — MENTHOL 3 MG MT LOZG: 1.0000 | LOZENGE | OROMUCOSAL | 12 refills | Status: DC | PRN

## 2023-05-27 MED ORDER — MAGNESIUM OXIDE -MG SUPPLEMENT 400 (240 MG) MG PO TABS: 200.0000 mg | ORAL_TABLET | Freq: Every day | ORAL | 0 refills | Status: DC

## 2023-05-27 MED ORDER — FUROSEMIDE 20 MG PO TABS: 20.0000 mg | ORAL_TABLET | Freq: Every day | ORAL | 0 refills | Status: DC

## 2023-05-27 MED ORDER — OXYCODONE HCL 5 MG PO TABS: 5.0000 mg | ORAL_TABLET | Freq: Four times a day (QID) | ORAL | 0 refills | Status: AC | PRN

## 2023-05-27 MED ORDER — POLYSACCHARIDE IRON COMPLEX 150 MG PO CAPS: 150.0000 mg | ORAL_CAPSULE | ORAL | 0 refills | Status: DC

## 2023-05-28 ENCOUNTER — Other Ambulatory Visit: Payer: Medicaid Other

## 2023-05-28 ENCOUNTER — Encounter: Payer: Medicaid Other | Admitting: Women's Health

## 2023-05-31 ENCOUNTER — Ambulatory Visit: Payer: Medicaid Other | Admitting: Obstetrics & Gynecology

## 2023-05-31 ENCOUNTER — Other Ambulatory Visit: Payer: Medicaid Other

## 2023-05-31 ENCOUNTER — Ambulatory Visit: Payer: Medicaid Other

## 2023-05-31 ENCOUNTER — Encounter: Payer: Self-pay | Admitting: Obstetrics & Gynecology

## 2023-05-31 VITALS — BP 128/89 | HR 95 | Ht 63.0 in | Wt 184.0 lb

## 2023-05-31 DIAGNOSIS — Z9889 Other specified postprocedural states: Secondary | ICD-10-CM

## 2023-05-31 NOTE — Progress Notes (Signed)
  HPI: Patient returns for routine postoperative follow-up having undergone repeat Caesarean section  on 05/25/23.  The patient's immediate postoperative recovery has been unremarkable. Since hospital discharge the patient reports no problems.   Current Outpatient Medications: acetaminophen (TYLENOL) 500 MG tablet, Take 1,000 mg by mouth every 6 (six) hours as needed for moderate pain (pain score 4-6)., Disp: , Rfl:  Blood Pressure Monitor MISC, For regular home bp monitoring during pregnancy, Disp: 1 each, Rfl: 0 fluticasone (FLONASE) 50 MCG/ACT nasal spray, Place 2 sprays into both nostrils daily., Disp: 16 g, Rfl: 2 furosemide (LASIX) 20 MG tablet, Take 1 tablet (20 mg total) by mouth daily., Disp: 5 tablet, Rfl: 0 ibuprofen (ADVIL) 600 MG tablet, Take 1 tablet (600 mg total) by mouth every 6 (six) hours., Disp: 30 tablet, Rfl: 0 iron polysaccharides (NIFEREX) 150 MG capsule, Take 1 capsule (150 mg total) by mouth every other day., Disp: 15 capsule, Rfl: 0 magnesium oxide (MAG-OX) 400 (240 Mg) MG tablet, Take 0.5 tablets (200 mg total) by mouth daily., Disp: 15 tablet, Rfl: 0 omeprazole (PRILOSEC) 20 MG capsule, Take 1 capsule (20 mg total) by mouth daily. 1 tablet a day, Disp: 30 capsule, Rfl: 6 oxyCODONE (OXY IR/ROXICODONE) 5 MG immediate release tablet, Take 1 tablet (5 mg total) by mouth every 6 (six) hours as needed for up to 5 days for moderate pain (pain score 4-6)., Disp: 20 tablet, Rfl: 0 SODIUM FLUORIDE 5000 PPM 1.1 % PSTE, Take 1 Application by mouth daily., Disp: , Rfl:  valACYclovir (VALTREX) 500 MG tablet, Take 1 tablet (500 mg total) by mouth 2 (two) times daily. (Patient taking differently: Take 500 mg by mouth daily.), Disp: 60 tablet, Rfl: 6 NIFEdipine (PROCARDIA XL/NIFEDICAL XL) 60 MG 24 hr tablet, Take 1 tablet (60 mg total) by mouth daily. (Patient not taking: Reported on 05/31/2023), Disp: 30 tablet, Rfl: 6 Prenatal Vit-Fe Fumarate-FA (PRENATAL VITAMIN PO), Take 1 tablet  by mouth 2 (two) times daily. (Patient not taking: Reported on 05/31/2023), Disp: , Rfl:   Current Facility-Administered Medications: albuterol (PROVENTIL) (2.5 MG/3ML) 0.083% nebulizer solution 2.5 mg, 2.5 mg, Nebulization, Once PRN, Cresenzo-Dishmon, Scarlette Calico, CNM diphenhydrAMINE (BENADRYL) injection 25 mg, 25 mg, Intravenous, Once PRN, Cresenzo-Dishmon, Scarlette Calico, CNM EPINEPHrine (ADRENALIN) 0.3 mg, 0.3 mg, Intramuscular, Once PRN, Cresenzo-Dishmon, Scarlette Calico, CNM methylPREDNISolone sodium succinate (SOLU-MEDROL) 125 mg in sodium chloride 0.9 % 50 mL IVPB, 125 mg, Intravenous, Once PRN, Cresenzo-Dishmon, Scarlette Calico, CNM sodium chloride 0.9 % bolus 500 mL, 500 mL, Intravenous, Once PRN, Cresenzo-Dishmon, Scarlette Calico, CNM sodium chloride flush (NS) 0.9 % injection 3 mL, 3 mL, Intravenous, PRN, Cresenzo-Dishmon, Scarlette Calico, CNM     Blood pressure 128/89, pulse 95, height 5\' 3"  (1.6 m), weight 184 lb (83.5 kg), last menstrual period 09/04/2022, currently breastfeeding.  Physical Exam: Incision clean dry intact Abdomen benign   Diagnostic Tests:   Pathology:   Impression + Management plan: (Z30.865) Post-operative state: c section  (primary encounter diagnosis)      Medications Prescribed this encounter: No orders of the defined types were placed in this encounter.     Follow up: 4 weeks   Lazaro Arms, MD Attending Physician for the Center for [facility] 05/31/2023 11:37 AM

## 2023-06-01 ENCOUNTER — Other Ambulatory Visit: Payer: Medicaid Other

## 2023-06-05 ENCOUNTER — Other Ambulatory Visit: Payer: Medicaid Other

## 2023-06-05 NOTE — BH Specialist Note (Signed)
 Integrated Behavioral Health via Telemedicine Visit  06/19/2023 [name] [id number]  Number of Integrated Behavioral Health Clinician visits: 4- Fourth Visit  Session Start time: 1316   Session End time: 1340  Total time in minutes: 24   Referring Provider: Suzen Fetters, CNM Patient/Family location: Home [facility] Provider location: [facility]  All persons participating in visit: [name] and [facility] Donna Conrad   Types of Service: Individual psychotherapy and Video visit  I connected with Daphne JAYSON Bergeron and/or Daphne C Baldinger's  n/a  via  Telephone or Engineer, Civil (consulting)  (Video is Surveyor, mining) and verified that I am speaking with the correct person using two identifiers. Discussed confidentiality: Yes   I discussed the limitations of telemedicine and the availability of in person appointments.  Discussed there is a possibility of technology failure and discussed alternative modes of communication if that failure occurs.  I discussed that engaging in this telemedicine visit, they consent to the provision of behavioral healthcare and the services will be billed under their insurance.  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 Pt feels mentally improved postpartum with good appetite and able to sleep when needed.  Duration of problem: Ongoing; Severity of problem: moderate  Patient and/or Family's Strengths/Protective Factors: Social connections, Concrete  supports in place (healthy food, safe environments, etc.), and Sense of purpose  Goals Addressed: Patient will:  Reduce symptoms of: anxiety and stress   Increase knowledge and/or ability of: healthy habits   Demonstrate ability to: Increase healthy adjustment to current life circumstances, Increase adequate support systems for patient/family, and Increase motivation to adhere to plan of care  Progress towards Goals: Ongoing  Interventions: Interventions utilized:  Motivational Interviewing, Psychoeducation and/or Health Education, Link to [employer], and Supportive Reflection Standardized Assessments completed: GAD-7 and PHQ 9  Patient and/or Family Response: Patient agrees with treatment plan.   Assessment: Patient currently experiencing PTSD; Generalized anxiety disorder; Major depressive disorder, in remission (all as previously diagnosed by psychiatry); more assessment needed to determine ADHD  Patient may benefit from psychoeducation and brief therapeutic interventions regarding coping with maintaining reduced depression and anxiety; would benefit from psychiatry for [facility] medication management.  .  Plan: Follow up with behavioral health clinician on : Two weeks Behavioral recommendations:  -Continue prioritizing healthy self-care (regular meals, adequate rest; allowing practical help from supportive friends and family) until at least postpartum medical appointment -Consider new mom support group as needed at either [email or web address].net or [email or web address]  -Accept referral to psychiatry for [facility] medication management  Referral(s): Integrated Art Gallery Manager (In Clinic) and [employer]:  new mom support  I discussed the assessment and treatment plan with the patient and/or parent/guardian. They were provided an opportunity to ask questions and all were answered. They agreed with the plan and demonstrated an understanding of the instructions.   They  were advised to call back or seek an in-person evaluation if the symptoms worsen or if the condition fails to improve as anticipated.  Aryn Kops C Becka Lagasse, LCSW  06/19/2023    1:23 PM 04/24/2023   10:27 AM 04/10/2023    9:15 AM 03/12/2023   10:41 AM 02/19/2023    9:26 AM  Depression screen PHQ 2/9  Decreased Interest 0 1 1  0  Down, Depressed, Hopeless 0 1 1  1   PHQ - 2 Score 0 2 2  1   Altered sleeping 1 1 1  3   Tired, decreased energy 1 1 1  3   Change in appetite 1 3 3  2   Feeling bad or failure about yourself  1 1 1  2   Trouble concentrating 3 1 1  2   Moving slowly or fidgety/restless 0 1 0  0  Suicidal thoughts 0 0 0  0  PHQ-9 Score 7 10 9  13   Difficult doing work/chores  Not difficult at all Not difficult at all       Information is confidential and restricted. Go to Review Flowsheets to unlock data.       06/19/2023    1:25 PM 02/19/2023    9:28 AM 02/01/2023    8:25 AM 12/01/2022    9:10 AM  GAD 7 : Generalized Anxiety Score  Nervous, Anxious, on Edge 1 3 3 3   Control/stop worrying 1 2 3 3   Worry too much - different things 1 2 3 3   Trouble relaxing 1 2 3 3   Restless 0 1 0 0  Easily annoyed or irritable 3 3 3 3   Afraid - awful might happen 1 3 3 3   Total GAD 7 Score 8 16 18  18

## 2023-06-08 ENCOUNTER — Other Ambulatory Visit: Payer: Medicaid Other

## 2023-06-11 ENCOUNTER — Encounter: Payer: Self-pay | Admitting: *Deleted

## 2023-06-12 ENCOUNTER — Other Ambulatory Visit: Payer: Medicaid Other

## 2023-06-14 ENCOUNTER — Other Ambulatory Visit: Payer: Self-pay | Admitting: *Deleted

## 2023-06-14 MED ORDER — POLYSACCHARIDE IRON COMPLEX 150 MG PO CAPS: 150.0000 mg | ORAL_CAPSULE | ORAL | 0 refills | Status: AC

## 2023-06-19 ENCOUNTER — Ambulatory Visit: Payer: Medicaid Other | Admitting: Clinical

## 2023-06-19 DIAGNOSIS — F4310 Post-traumatic stress disorder, unspecified: Secondary | ICD-10-CM

## 2023-06-19 DIAGNOSIS — F411 Generalized anxiety disorder: Secondary | ICD-10-CM

## 2023-06-19 DIAGNOSIS — Z658 Other specified problems related to psychosocial circumstances: Secondary | ICD-10-CM

## 2023-06-19 DIAGNOSIS — F3340 Major depressive disorder, recurrent, in remission, unspecified: Secondary | ICD-10-CM

## 2023-06-19 NOTE — Patient Instructions (Signed)
 [facility] at [facility] [address] [place], [place] [address] [phone] (main office) [phone] [facility] office)  New Parent Support Groups [email or web address] [email or web address]   [facility]  [address], [place], [place] [address] [phone] or [phone] [facility] 24/7 FOR ANYONE [address], [place], [place]  [phone] Fax: [phone] [email or web address] *Interpreters available *Accepts all insurance and uninsured for Urgent Care needs *Accepts Medicaid and uninsured for outpatient treatment (below)    ONLY FOR [facility]  Below:   Outpatient New Patient Assessment/Therapy Walk-ins:        Monday -Thursday 8am until slots are full.        Every Friday 1pm-4pm  (first come, first served)                   New Patient Psychiatry/Medication Management        Monday-Friday 8am-11am (first come, first served)              For all walk-ins we ask that you arrive by 7:15am, because patients will be seen in the order of arrival.

## 2023-06-20 NOTE — BH Specialist Note (Signed)
Pt did not arrive to video visit and did not answer the phone; Left HIPPA-compliant message to call back Asher Muir from [employer] for [employer] at [facility] for Women at  [phone] [facility] office).  ?; left MyChart message for patient.  ? ?

## 2023-06-30 ENCOUNTER — Encounter: Payer: Self-pay | Admitting: Emergency Medicine

## 2023-06-30 ENCOUNTER — Ambulatory Visit
Admission: EM | Admit: 2023-06-30 | Discharge: 2023-06-30 | Disposition: A | Discharge status: Home / Self Care | Payer: Medicaid Other | Attending: Family Medicine | Admitting: Family Medicine

## 2023-06-30 ENCOUNTER — Other Ambulatory Visit: Payer: Self-pay

## 2023-06-30 DIAGNOSIS — J069 Acute upper respiratory infection, unspecified: Secondary | ICD-10-CM | POA: Diagnosis not present

## 2023-06-30 DIAGNOSIS — Z20828 Contact with and (suspected) exposure to other viral communicable diseases: Secondary | ICD-10-CM | POA: Diagnosis not present

## 2023-06-30 LAB — POC COVID19/FLU A&B COMBO
Covid Antigen, POC: NEGATIVE
Influenza A Antigen, POC: NEGATIVE
Influenza B Antigen, POC: NEGATIVE

## 2023-06-30 MED ORDER — PROMETHAZINE-DM 6.25-15 MG/5ML PO SYRP
5.0000 mL | ORAL_SOLUTION | Freq: Four times a day (QID) | ORAL | 0 refills | Status: DC | PRN
Start: 2023-06-30 — End: 2023-08-30

## 2023-06-30 MED ORDER — FLUTICASONE PROPIONATE 50 MCG/ACT NA SUSP
1.0000 | Freq: Two times a day (BID) | NASAL | 2 refills | Status: AC
Start: 2023-06-30 — End: ?

## 2023-06-30 MED ORDER — OSELTAMIVIR PHOSPHATE 75 MG PO CAPS: 75.0000 mg | ORAL_CAPSULE | Freq: Two times a day (BID) | ORAL | 0 refills | Status: DC

## 2023-06-30 NOTE — ED Triage Notes (Signed)
Pt reports cough, generalized body aches, chills, runny nose since yesterday. Had close exposure to similar.

## 2023-07-02 ENCOUNTER — Ambulatory Visit: Payer: Medicaid Other | Admitting: Obstetrics & Gynecology

## 2023-07-03 ENCOUNTER — Ambulatory Visit: Payer: Medicaid Other | Admitting: Clinical

## 2023-07-04 NOTE — ED Provider Notes (Signed)
[facility]    CSN: [id number] Arrival date & time: 06/30/23  1191      History   Chief Complaint Chief Complaint  Patient presents with   Generalized Body Aches    HPI Donna Conrad is a 28 y.o. female.   Presenting today with 1 day history of cough, body aches, chills, runny nose.  Denies chest pain, shortness of breath, abdominal pain, nausea vomiting or diarrhea.  So far trying over-the-counter remedies with minimal relief.  Multiple close exposures to similar.    Past Medical History:  Diagnosis Date   HSV infection    Pre-eclampsia     Patient Active Problem List   Diagnosis Date Noted   Maternal care due to low transverse uterine scar from previous cesarean delivery 05/25/2023   S/P cesarean section 05/25/2023   Pica 04/02/2023   Anemia affecting pregnancy in third trimester 03/28/2023   Major depressive disorder, recurrent episode, moderate (HCC) 03/12/2023   Generalized anxiety disorder 03/12/2023   PTSD (post-traumatic stress disorder) 03/12/2023   Psychophysiologic insomnia 03/12/2023   Pregnant 03/12/2023   Cannabis use disorder 03/12/2023   Carrier of spinal muscular atrophy 01/01/2023   Herpes 12/05/2022   Prediabetes 12/04/2022   History of cesarean delivery, currently pregnant 12/01/2022   History of pre-eclampsia in prior pregnancy, currently pregnant 11/30/2022   History of HELLP syndrome, currently pregnant 11/30/2022   Supervision of high-risk pregnancy 11/30/2022   Chronic hypertension affecting pregnancy 10/13/2022    Past Surgical History:  Procedure Laterality Date   CESAREAN SECTION N/A 11/19/2014   Procedure: CESAREAN SECTION;  Surgeon: Sherian Rein, MD;  Location: [facility];  Service: Obstetrics;  Laterality: N/A;   CESAREAN SECTION N/A 05/25/2023   Procedure: CESAREAN SECTION;  Surgeon: Lennart Pall, MD;  Location: [facility] LD ORS;  Service: Obstetrics;  Laterality: N/A;   INTRAUTERINE DEVICE (IUD) INSERTION N/A  05/25/2023   Procedure: POSSIBLE INTRAUTERINE DEVICE (IUD) INSERTION;  Surgeon: Lennart Pall, MD;  Location: [facility] LD ORS;  Service: Obstetrics;  Laterality: N/A;   WISDOM TOOTH EXTRACTION      OB History     Gravida  2   Para  2   Term  1   Preterm  1   AB      Living  2      SAB      IAB      Ectopic      Multiple  0   Live Births  2            Home Medications    Prior to Admission medications   Medication Sig Start Date End Date Taking? Authorizing Provider  fluticasone (FLONASE) 50 MCG/ACT nasal spray Place 1 spray into both nostrils 2 (two) times daily. 06/30/23  Yes Particia Nearing, PA-C  oseltamivir (TAMIFLU) 75 MG capsule Take 1 capsule (75 mg total) by mouth every 12 (twelve) hours. 06/30/23  Yes Particia Nearing, PA-C  promethazine-dextromethorphan (PROMETHAZINE-DM) 6.25-15 MG/5ML syrup Take 5 mLs by mouth 4 (four) times daily as needed. 06/30/23  Yes Particia Nearing, PA-C  acetaminophen (TYLENOL) 500 MG tablet Take 1,000 mg by mouth every 6 (six) hours as needed for moderate pain (pain score 4-6).    [provider]  Blood Pressure Monitor MISC For regular home bp monitoring during pregnancy 12/01/22   Federico Flake, MD  fluticasone [facility]) 50 MCG/ACT nasal spray Place 2 sprays into both nostrils daily. 05/28/23   Richardson Landry, CNM  furosemide (LASIX) 20 MG tablet Take 1 tablet (20 mg total) by mouth daily. 05/28/23   Warren-Hill, Clarita Crane, CNM  ibuprofen (ADVIL) 600 MG tablet Take 1 tablet (600 mg total) by mouth every 6 (six) hours. 05/27/23   Warren-Hill, Clarita Crane, CNM  iron polysaccharides (NIFEREX) 150 MG capsule Take 1 capsule (150 mg total) by mouth every other day. 06/14/23   Cresenzo-Dishmon, Scarlette Calico, CNM  magnesium oxide (MAG-OX) 400 (240 Mg) MG tablet Take 0.5 tablets (200 mg total) by mouth daily. 05/28/23   Warren-Hill, Clarita Crane, CNM  NIFEdipine (PROCARDIA XL/NIFEDICAL XL) 60 MG 24 hr tablet Take 1  tablet (60 mg total) by mouth daily. Patient not taking: Reported on 05/31/2023 12/01/22   Federico Flake, MD  omeprazole (PRILOSEC) 20 MG capsule Take 1 capsule (20 mg total) by mouth daily. 1 tablet a day 03/13/23   Lazaro Arms, MD  Prenatal Vit-Fe Fumarate-FA (PRENATAL VITAMIN PO) Take 1 tablet by mouth 2 (two) times daily. Patient not taking: Reported on 05/31/2023    [provider]  SODIUM FLUORIDE 5000 PPM 1.1 % PSTE Take 1 Application by mouth daily. 03/13/23   [provider]  valACYclovir (VALTREX) 500 MG tablet Take 1 tablet (500 mg total) by mouth 2 (two) times daily. Patient taking differently: Take 500 mg by mouth daily. 01/01/23   Cheral Marker, CNM    Family History Family History  Problem Relation Age of Onset   Asthma Father    Depression Father    Stroke Father    Asthma Mother    Diabetes Mother    Hypertension Mother    Miscarriages / [place] Mother    Stroke Mother    Bipolar disorder Mother    Diabetes Brother    Diabetes Sister    Mental illness Sister    Miscarriages / [place] Sister    Bipolar disorder Sister    Asthma Daughter    Diabetes Maternal Aunt     Social History Social History   Tobacco Use   Smoking status: Former    Types: Cigarettes   Smokeless tobacco: Never   Tobacco comments:    Pt reports smoking only some days, but has stopped since finding out she was pregnant.   Vaping Use   Vaping status: Never Used  Substance Use Topics   Alcohol use: Not Currently    Comment: Last drink in February 2024.  1 person a bottle of gray goose vodka at a time   Drug use: Not Currently    Types: Marijuana    Comment: Couple hits of a joint last use in early October 2024     Allergies   Patient has no known allergies.   Review of Systems Review of Systems Per HPI  Physical Exam Triage Vital Signs ED Triage Vitals [06/30/23 0904]  Encounter Vitals Group     BP      Systolic BP Percentile       Diastolic BP Percentile      Pulse      Resp      Temp      Temp src      SpO2      Weight      Height      Head Circumference      Peak Flow      Pain Score 10     Pain Loc      Pain Education      Exclude from Growth Chart  No data found.  Updated Vital Signs LMP 09/04/2022 (Exact Date)   Breastfeeding No   Visual Acuity Right Eye Distance:   Left Eye Distance:   Bilateral Distance:    Right Eye Near:   Left Eye Near:    Bilateral Near:     Physical Exam Vitals and nursing note reviewed.  Constitutional:      Appearance: Normal appearance.  HENT:     Head: Atraumatic.     Right Ear: Tympanic membrane and external ear normal.     Left Ear: Tympanic membrane and external ear normal.     Nose: Rhinorrhea present.     Mouth/Throat:     Mouth: Mucous membranes are moist.     Pharynx: Posterior oropharyngeal erythema present.  Eyes:     Extraocular Movements: Extraocular movements intact.     Conjunctiva/sclera: Conjunctivae normal.  Cardiovascular:     Rate and Rhythm: Normal rate and regular rhythm.     Heart sounds: Normal heart sounds.  Pulmonary:     Effort: Pulmonary effort is normal.     Breath sounds: Normal breath sounds. No wheezing.  Musculoskeletal:        General: Normal range of motion.     Cervical back: Normal range of motion and neck supple.  Skin:    General: Skin is warm and dry.  Neurological:     Mental Status: She is alert and oriented to person, place, and time.  Psychiatric:        Mood and Affect: Mood normal.        Thought Content: Thought content normal.      UC Treatments / Results  Labs (all labs ordered are listed, but only abnormal results are displayed) Labs Reviewed  POC COVID19/FLU A&B COMBO    EKG   Radiology No results found.  Procedures Procedures (including critical care time)  Medications Ordered in UC Medications - No data to display  Initial Impression / Assessment and Plan / UC Course  I have  reviewed the triage vital signs and the nursing notes.  Pertinent labs & imaging results that were available during my care of the patient were reviewed by me and considered in my medical decision making (see chart for details).     Rapid COVID and flu negative but given exposure to flu we will start Tamiflu, Phenergan DM, Flonase, supportive over-the-counter medications and home care.  Return for worsening symptoms.  Final Clinical Impressions(s) / UC Diagnoses   Final diagnoses:  Viral URI with cough  Exposure to influenza   Discharge Instructions   None    ED Prescriptions     Medication Sig Dispense Auth. Provider   oseltamivir (TAMIFLU) 75 MG capsule Take 1 capsule (75 mg total) by mouth every 12 (twelve) hours. 10 capsule Particia Nearing, [place]   promethazine-dextromethorphan (PROMETHAZINE-DM) 6.25-15 MG/5ML syrup Take 5 mLs by mouth 4 (four) times daily as needed. 100 mL Particia Nearing, PA-C   fluticasone [facility]) 50 MCG/ACT nasal spray Place 1 spray into both nostrils 2 (two) times daily. 16 g Particia Nearing, [place]      PDMP not reviewed this encounter.   Particia Nearing, [place] 07/04/23 1835

## 2023-07-05 ENCOUNTER — Ambulatory Visit: Payer: Medicaid Other | Admitting: Women's Health

## 2023-07-18 ENCOUNTER — Ambulatory Visit: Payer: Medicaid Other | Admitting: Advanced Practice Midwife

## 2023-07-18 ENCOUNTER — Encounter: Payer: Self-pay | Admitting: Advanced Practice Midwife

## 2023-07-18 DIAGNOSIS — O0991 Supervision of high risk pregnancy, unspecified, first trimester: Secondary | ICD-10-CM

## 2023-07-18 DIAGNOSIS — I10 Essential (primary) hypertension: Secondary | ICD-10-CM

## 2023-07-18 DIAGNOSIS — O10919 Unspecified pre-existing hypertension complicating pregnancy, unspecified trimester: Secondary | ICD-10-CM

## 2023-07-18 DIAGNOSIS — Z98891 History of uterine scar from previous surgery: Secondary | ICD-10-CM | POA: Diagnosis not present

## 2023-07-18 MED ORDER — IBUPROFEN 600 MG PO TABS: 600.0000 mg | ORAL_TABLET | Freq: Four times a day (QID) | ORAL | 0 refills | Status: AC

## 2023-07-18 MED ORDER — VALACYCLOVIR HCL 500 MG PO TABS: 500.0000 mg | ORAL_TABLET | Freq: Every day | ORAL | 4 refills | Status: AC

## 2023-07-18 MED ORDER — NIFEDIPINE ER OSMOTIC RELEASE 60 MG PO TB24: 60.0000 mg | ORAL_TABLET | Freq: Every day | ORAL | 2 refills | Status: DC

## 2023-07-18 NOTE — Progress Notes (Signed)
POSTPARTUM VISIT Patient name: Donna Conrad MRN [id number]  Date of birth: [birth year] Chief Complaint:   Postpartum Care (Needs refill on Valtrex, muscle spasms)  History of Present Illness:   Donna Conrad is a 28 y.o. G35P1102 African American female being seen today for a postpartum visit. She is 7 weeks postpartum following a repeat cesarean section, low transverse incision at 37.4 gestational weeks. IOL: n/a, for elective repeat for worsening cHTN. Anesthesia: spinal.  Laceration: n/a.  Complications: none. Inpatient contraception: no.   Pregnancy complicated by cHTN, anemia, carrier SMA . Tobacco use: former . Substance use disorder: no. Last pap smear: July 2024 and results were NILM w/ HRHPV negative. Next pap smear due: July 2027 Patient's last menstrual period was 09/04/2022 (exact date).  Postpartum course has been uncomplicated. Bleeding  light yellow d/c, no symptoms . Bowel function is normal. Bladder function is normal. Urinary incontinence? no, fecal incontinence? no Patient is sexually active. Last sexual activity:  about 1-1.5wks ago . Desired contraception: Mirena at pp visit   . Patient does not want a pregnancy in the future.  Desired family size is 2 children.   Upstream - 07/18/23 1016       Pregnancy Intention Screening   Does the patient want to become pregnant in the next year? No    Does the patient's partner want to become pregnant in the next year? No    Would the patient like to discuss contraceptive options today? No      Contraception Wrap Up   Current Method Female Condom    End Method Female Condom    Contraception Counseling Provided Yes            The pregnancy intention screening data noted above was reviewed. Potential methods of contraception were discussed. The patient elected to proceed with Female Condom.  Edinburgh Postpartum Depression Screening: negative  Edinburgh Postnatal Depression Scale - 07/18/23 1017       Edinburgh Postnatal  Depression Scale:  In the Past 7 Days   I have been able to laugh and see the funny side of things. 0    I have looked forward with enjoyment to things. 0    I have blamed myself unnecessarily when things went wrong. 1    I have been anxious or worried for no good reason. 1    I have felt scared or panicky for no good reason. 0    Things have been getting on top of me. 1    I have been so unhappy that I have had difficulty sleeping. 0    I have felt sad or miserable. 0    I have been so unhappy that I have been crying. 0    The thought of harming myself has occurred to me. 0    Edinburgh Postnatal Depression Scale Total 3                06/19/2023    1:25 PM 02/19/2023    9:28 AM 02/01/2023    8:25 AM 12/01/2022    9:10 AM  GAD 7 : Generalized Anxiety Score  Nervous, Anxious, on Edge 1 3 3 3   Control/stop worrying 1 2 3 3   Worry too much - different things 1 2 3 3   Trouble relaxing 1 2 3 3   Restless 0 1 0 0  Easily annoyed or irritable 3 3 3 3   Afraid - awful might happen 1 3 3 3   Total GAD 7 Score  8 16 18 18      Baby's course has been uncomplicated. Baby is feeding by bottle. Infant has a pediatrician/family doctor? Yes.  Childcare strategy if returning to work/school: family.  Pt has material needs met for her and baby: Yes.   Review of Systems:   Pertinent items are noted in HPI Denies Abnormal vaginal discharge w/ itching/odor/irritation, headaches, visual changes, shortness of breath, chest pain, abdominal pain, severe nausea/vomiting, or problems with urination or bowel movements. Pertinent History Reviewed:  Reviewed past medical,surgical, obstetrical and family history.  Reviewed problem list, medications and allergies. OB History  Gravida Para Term Preterm AB Living  2 2 1 1  2   SAB IAB Ectopic Multiple Live Births     0 2    # Outcome Date GA Lbr Len/2nd Weight Sex Type Anes PTL Lv  2 Term 05/25/23 [redacted]w[redacted]d  6 lb 12.3 oz (3.07 kg) F CS-LTranv Spinal  LIV  1  Preterm 11/19/14 [redacted]w[redacted]d  3 lb 5.6 oz (1.52 kg) F CS-LTranv Spinal  LIV     Complications: Severe pre-eclampsia, HELLP syndrome   Physical Assessment:   Vitals:   07/18/23 1014  BP: (!) 137/90  Pulse: 86  Weight: 200 lb (90.7 kg)  Height: 5\' 3"  (1.6 m)  Body mass index is 35.43 kg/m.       Physical Examination:   General appearance: alert, well appearing, and in no distress  Mental status: alert, oriented to person, place, and time  Skin: warm & dry   Cardiovascular: normal heart rate noted   Respiratory: normal respiratory effort, no distress   Breasts: deferred, no complaints   Abdomen: soft, pannus tender; incision well-healed and without s/s infection  Pelvic: examination not indicated. Thin prep pap obtained: No  Rectal: not examined  Extremities: Edema: none         No results found for this or any previous visit (from the past 24 hours).  Assessment & Plan:  1) Postpartum exam 2) Seven wks s/p repeat cesarean section, low transverse incision 3) bottle feeding 4) Depression screening> negative  5) Contraception counseling> wants Mirena; had sex 1-1.5wks ago; will get appt early next week with no sex between now and then; hasn't had an IUD before- placement process rev'd and questions answered  6) cHTN> managed on Procardia XL 60mg ; rec see PCP soon; refills given 7) Dull abd cramping/low back pain> refilled ibuprofen 600mg ; rec wear abd binder if it helps  Essential components of care per ACOG recommendations:  1.  Mood and well being:  If positive depression screen, discussed and plan developed.  If using tobacco we discussed reduction/cessation and risk of relapse If current substance abuse, we discussed and referral to local resources was offered.   2. Infant care and feeding:  If breastfeeding, discussed returning to work, pumping, breastfeeding-associated pain, guidance regarding return to fertility while lactating if not using another method. If needed, patient  was provided with a letter to be allowed to pump q 2-3hrs to support lactation in a private location with access to a refrigerator to store breastmilk.   Recommended that all caregivers be immunized for flu, pertussis and other preventable communicable diseases If pt does not have material needs met for her/baby, referred to local resources for help obtaining these.  3. Sexuality, contraception and birth spacing Provided guidance regarding sexuality, management of dyspareunia, and resumption of intercourse Discussed avoiding interpregnancy interval <69mths and recommended birth spacing of 18 months  4. Sleep and fatigue Discussed coping options  for fatigue and sleep disruption Encouraged family/partner/community support of 4 hrs of uninterrupted sleep to help with mood and fatigue  5. Physical recovery  If pt had a C/S, assessed incisional pain and providing guidance on normal vs prolonged recovery If pt had a laceration, perineal healing and pain reviewed.  If urinary or fecal incontinence, discussed management and referred to PT or uro/gyn if indicated  Patient is safe to resume physical activity. Discussed attainment of healthy weight.  6.  Chronic disease management Discussed pregnancy complications if any, and their implications for future childbearing and long-term maternal health. Review recommendations for prevention of recurrent pregnancy complications, such as 17 hydroxyprogesterone caproate to reduce risk for recurrent PTB not applicable, or aspirin to reduce risk of preeclampsia yes. Pt had GDM: no. If yes, 2hr GTT scheduled: not applicable. Reviewed medications and non-pregnant dosing including consideration of whether pt is breastfeeding using a reliable resource such as LactMed: not applicable Referred for f/u w/ PCP or subspecialist providers as indicated: has PCP at [facility]- recommended sched appt soon for f/u w cHTN management  7. Health maintenance Mammogram at  28yo or earlier if indicated Pap smears as indicated  Meds:  Meds ordered this encounter  Medications   ibuprofen (ADVIL) 600 MG tablet    Sig: Take 1 tablet (600 mg total) by mouth every 6 (six) hours.    Dispense:  30 tablet    Refill:  0    Supervising Provider:   Myna Hidalgo [[id number]]   NIFEdipine (PROCARDIA XL/NIFEDICAL XL) 60 MG 24 hr tablet    Sig: Take 1 tablet (60 mg total) by mouth daily.    Dispense:  30 tablet    Refill:  2    Supervising Provider:   Myna Hidalgo [[id number]]   valACYclovir (VALTREX) 500 MG tablet    Sig: Take 1 tablet (500 mg total) by mouth daily.    Dispense:  34 tablet    Refill:  4    Supervising Provider:   Myna Hidalgo [[id number]]    Follow-up: Return for IUD early next week.   No orders of the defined types were placed in this encounter.   Arabella Merles CNM 07/18/2023 11:30 AM

## 2023-07-23 ENCOUNTER — Encounter: Payer: Self-pay | Admitting: Advanced Practice Midwife

## 2023-07-23 ENCOUNTER — Ambulatory Visit: Payer: Medicaid Other | Admitting: Advanced Practice Midwife

## 2023-07-23 VITALS — BP 152/109 | HR 76 | Ht 63.0 in | Wt 200.0 lb

## 2023-07-23 DIAGNOSIS — Z538 Procedure and treatment not carried out for other reasons: Secondary | ICD-10-CM

## 2023-07-23 DIAGNOSIS — Z30013 Encounter for initial prescription of injectable contraceptive: Secondary | ICD-10-CM | POA: Diagnosis not present

## 2023-07-23 DIAGNOSIS — Z3202 Encounter for pregnancy test, result negative: Secondary | ICD-10-CM | POA: Diagnosis not present

## 2023-07-23 LAB — POCT URINE PREGNANCY: Preg Test, Ur: NEGATIVE

## 2023-07-23 MED ORDER — MEDROXYPROGESTERONE ACETATE 150 MG/ML IM SUSY
150.0000 mg | PREFILLED_SYRINGE | Freq: Once | INTRAMUSCULAR | Status: AC
  Administered 2023-07-23: 150 mg via INTRAMUSCULAR

## 2023-07-23 MED ORDER — MEDROXYPROGESTERONE ACETATE 150 MG/ML IM SUSP
150.0000 mg | INTRAMUSCULAR | 3 refills | Status: DC
Start: 2023-07-23 — End: 2024-01-31

## 2023-07-23 NOTE — Progress Notes (Signed)
 [facility] Visit  Patient name: Donna Conrad MRN [id number]  Date of birth: 11/12/1995  CC & HPI:  Donna Conrad is a 28 y.o.  female presenting today for IUD insertion.  Feels like she's going to start her period, feels crampy.   Pertinent History Reviewed:  Medical & Surgical Hx:   Past Medical History:  Diagnosis Date   HSV infection    Pre-eclampsia    Past Surgical History:  Procedure Laterality Date   CESAREAN SECTION N/A 11/19/2014   Procedure: CESAREAN SECTION;  Surgeon: Sherian Rein, MD;  Location: [facility];  Service: Obstetrics;  Laterality: N/A;   CESAREAN SECTION N/A 05/25/2023   Procedure: CESAREAN SECTION;  Surgeon: Lennart Pall, MD;  Location: [facility] LD ORS;  Service: Obstetrics;  Laterality: N/A;   INTRAUTERINE DEVICE (IUD) INSERTION N/A 05/25/2023   Procedure: POSSIBLE INTRAUTERINE DEVICE (IUD) INSERTION;  Surgeon: Lennart Pall, MD;  Location: [facility] LD ORS;  Service: Obstetrics;  Laterality: N/A;   WISDOM TOOTH EXTRACTION     Family History  Problem Relation Age of Onset   Asthma Father    Depression Father    Stroke Father    Asthma Mother    Diabetes Mother    Hypertension Mother    Miscarriages / [place] Mother    Stroke Mother    Bipolar disorder Mother    Diabetes Brother    Diabetes Sister    Mental illness Sister    Miscarriages / Stillbirths Sister    Bipolar disorder Sister    Asthma Daughter    Diabetes Maternal Aunt     Current Outpatient Medications:    medroxyPROGESTERone (DEPO-PROVERA) 150 MG/ML injection, Inject 1 mL (150 mg total) into the muscle every 3 (three) months., Disp: 1 mL, Rfl: 3   NIFEdipine (PROCARDIA XL/NIFEDICAL XL) 60 MG 24 hr tablet, Take 1 tablet (60 mg total) by mouth daily., Disp: 30 tablet, Rfl: 2   valACYclovir (VALTREX) 500 MG tablet, Take 1 tablet (500 mg total) by mouth daily., Disp: 34 tablet, Rfl: 4   acetaminophen (TYLENOL) 500 MG tablet, Take 1,000 mg by mouth every 6 (six)  hours as needed for moderate pain (pain score 4-6)., Disp: , Rfl:    Blood Pressure Monitor MISC, For regular home bp monitoring during pregnancy (Patient not taking: Reported on 07/18/2023), Disp: 1 each, Rfl: 0   fluticasone (FLONASE) 50 MCG/ACT nasal spray, Place 2 sprays into both nostrils daily., Disp: 16 g, Rfl: 2   fluticasone (FLONASE) 50 MCG/ACT nasal spray, Place 1 spray into both nostrils 2 (two) times daily. (Patient not taking: Reported on 07/18/2023), Disp: 16 g, Rfl: 2   ibuprofen (ADVIL) 600 MG tablet, Take 1 tablet (600 mg total) by mouth every 6 (six) hours., Disp: 30 tablet, Rfl: 0   iron polysaccharides (NIFEREX) 150 MG capsule, Take 1 capsule (150 mg total) by mouth every other day. (Patient not taking: Reported on 07/18/2023), Disp: 15 capsule, Rfl: 0   magnesium oxide (MAG-OX) 400 (240 Mg) MG tablet, Take 0.5 tablets (200 mg total) by mouth daily. (Patient not taking: Reported on 07/18/2023), Disp: 15 tablet, Rfl: 0   omeprazole (PRILOSEC) 20 MG capsule, Take 1 capsule (20 mg total) by mouth daily. 1 tablet a day (Patient not taking: Reported on 07/18/2023), Disp: 30 capsule, Rfl: 6   Prenatal Vit-Fe Fumarate-FA (PRENATAL VITAMIN PO), Take 1 tablet by mouth 2 (two) times daily. (Patient not taking: Reported on 05/31/2023), Disp: , Rfl:  promethazine-dextromethorphan (PROMETHAZINE-DM) 6.25-15 MG/5ML syrup, Take 5 mLs by mouth 4 (four) times daily as needed. (Patient not taking: Reported on 07/23/2023), Disp: 100 mL, Rfl: 0   SODIUM FLUORIDE 5000 PPM 1.1 % PSTE, Take 1 Application by mouth daily. (Patient not taking: Reported on 07/18/2023), Disp: , Rfl:   Current Facility-Administered Medications:    albuterol (PROVENTIL) (2.5 MG/3ML) 0.083% nebulizer solution 2.5 mg, 2.5 mg, Nebulization, Once PRN, Cresenzo-Dishmon, Scarlette Calico, CNM   diphenhydrAMINE (BENADRYL) injection 25 mg, 25 mg, Intravenous, Once PRN, Cresenzo-Dishmon, Scarlette Calico, CNM   EPINEPHrine (ADRENALIN) 0.3 mg, 0.3 mg,  Intramuscular, Once PRN, Cresenzo-Dishmon, Scarlette Calico, CNM   methylPREDNISolone sodium succinate (SOLU-MEDROL) 125 mg in sodium chloride 0.9 % 50 mL IVPB, 125 mg, Intravenous, Once PRN, Cresenzo-Dishmon, Scarlette Calico, CNM   sodium chloride 0.9 % bolus 500 mL, 500 mL, Intravenous, Once PRN, Cresenzo-Dishmon, Scarlette Calico, CNM   sodium chloride flush (NS) 0.9 % injection 3 mL, 3 mL, Intravenous, PRN, Cresenzo-Dishmon, Scarlette Calico, CNM Social History: Reviewed -  reports that she has quit smoking. Her smoking use included cigarettes. She has never used smokeless tobacco.  Review of Systems:   Constitutional: Negative for fever and chills Eyes: Negative for visual disturbances Respiratory: Negative for shortness of breath, dyspnea Cardiovascular: Negative for chest pain or palpitations  Gastrointestinal: Negative for vomiting, diarrhea and constipation; no abdominal pain Genitourinary: Negative for dysuria and urgency, vaginal irritation or itching Musculoskeletal: Negative for back pain, joint pain, myalgias  Neurological: Negative for dizziness and headaches    Objective Findings:    Physical Examination: Vitals:   07/23/23 1200 07/23/23 1202  BP: (!) 157/98 (!) 152/109  Pulse: 76    General appearance - well appearing, and in no distress Mental status - alert, oriented to person, place, and time Chest:  Normal respiratory effort Heart - normal rate and regular rhythm Abdomen:  Soft, nontender Pelvic: SSE:  placed speculum, cx cleansed w/Betadine.  Placed tennaculum (w/pt cough) on lower part of cx.  Attempted dilation of os, pt asked to stop procedure and declined to proceed w/IUD insertion .   Musculoskeletal:  Normal range of motion without pain Extremities:  No edema    Results for orders placed or performed in visit on 07/23/23 (from the past 24 hours)  POCT urine pregnancy   Collection Time: 07/23/23  2:04 PM  Result Value Ref Range   Preg Test, Ur Negative Negative      Assessment &  Plan:  A:   Other BC options discussed, recommended pg only for now d/t HTN.  Pt elects w/Depo.  SE/R/B discussed.       Return for  10m nths for depo .  Jacklyn Shell CNM 07/23/2023 2:11 PM
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
## 2023-07-26 ENCOUNTER — Ambulatory Visit: Payer: Medicaid Other

## 2023-07-26 DIAGNOSIS — F3340 Major depressive disorder, recurrent, in remission, unspecified: Secondary | ICD-10-CM

## 2023-07-26 DIAGNOSIS — F4310 Post-traumatic stress disorder, unspecified: Secondary | ICD-10-CM | POA: Diagnosis not present

## 2023-07-26 DIAGNOSIS — F411 Generalized anxiety disorder: Secondary | ICD-10-CM

## 2023-07-26 DIAGNOSIS — Z658 Other specified problems related to psychosocial circumstances: Secondary | ICD-10-CM

## 2023-08-28 ENCOUNTER — Telehealth (HOSPITAL_COMMUNITY): Payer: Self-pay

## 2023-08-28 NOTE — Telephone Encounter (Signed)
 Lvm to confirm 08/30/23 appt by 12:00 08/29/23

## 2023-08-30 ENCOUNTER — Encounter (HOSPITAL_COMMUNITY): Payer: Self-pay | Admitting: Registered Nurse

## 2023-08-30 ENCOUNTER — Ambulatory Visit (HOSPITAL_COMMUNITY): Admitting: Registered Nurse

## 2023-08-30 DIAGNOSIS — F411 Generalized anxiety disorder: Secondary | ICD-10-CM

## 2023-08-30 DIAGNOSIS — F330 Major depressive disorder, recurrent, mild: Secondary | ICD-10-CM

## 2023-08-30 DIAGNOSIS — F909 Attention-deficit hyperactivity disorder, unspecified type: Secondary | ICD-10-CM | POA: Diagnosis not present

## 2023-08-30 MED ORDER — HYDROXYZINE HCL 10 MG PO TABS: ORAL_TABLET | ORAL | 0 refills | Status: DC

## 2023-08-30 MED ORDER — BUPROPION HCL ER (XL) 150 MG PO TB24: 150.0000 mg | ORAL_TABLET | Freq: Every day | ORAL | 0 refills | Status: DC

## 2023-08-30 NOTE — Progress Notes (Signed)
 Psychiatric Initial Adult Assessment   Patient Identification: Donna Conrad MRN:  [id number] Date of Evaluation:  08/30/2023  Virtual Visit via Video Note  I connected with Donna Conrad on 08/30/23 at 10:00 AM EDT by a video enabled telemedicine application and verified that I am speaking with the correct person using two identifiers.  Location: Patient: Home Provider: Home office   I discussed the limitations of evaluation and management by telemedicine and the availability of in person appointments. The patient expressed understanding and agreed to proceed.   I discussed the assessment and treatment plan with the patient. The patient was provided an opportunity to ask questions and all were answered. The patient agreed with the plan and demonstrated an understanding of the instructions.   The patient was advised to call back or seek an in-person evaluation if the symptoms worsen or if the condition fails to improve as anticipated.  I provided 60 minutes of non-face-to-face time during this encounter.   Assunta Found, NP    Referral Source: Cheral Marker, CNM [facility] Chief Complaint:   Chief Complaint  Patient presents with   Establish Care    Medication management   Visit Diagnosis:    ICD-10-CM   1. GAD (generalized anxiety disorder)  F41.1 hydrOXYzine (ATARAX) 10 MG tablet    CBC with Differential    Comprehensive metabolic panel with GFR    TSH    HgB A1c    Prolactin    Lipid panel    Pregnancy, urine    Drug Screen, Urine    2. Major depressive disorder, recurrent, mild (HCC)  F33.0 buPROPion (WELLBUTRIN XL) 150 MG 24 hr tablet    3. Attention deficit hyperactivity disorder (ADHD), unspecified ADHD type  F90.9 buPROPion (WELLBUTRIN XL) 150 MG 24 hr tablet      History of Present Illness:  Donna Conrad 28 y.o. female presents today to establish care for medication management.  She is seen via virtual video visit by this provider, and  chart reviewed on 08/30/23.  Her psychiatric history is significant for depression, anxiety, and ADHD.  Her mental health is not currently managed with any psychotropic medication.  She reports she was started on a medication during her pregnancy but "I stopped because it was making me sick."  Denies prior suicide attempt, self-injurious behaviors, and psychiatric hospitalization.  She reports prior outpatient psychiatric services as a child for the treatment of ADHD but unable to recall the names of any medications she was treated with when younger.  She reports symptoms of anxiety (feeling overwhelmed, constant worrying, anxiousness) and some depression but states it is not as bad as her anxiety.  Reports current stressor "I've got a 1 month old and 26 y/o, since starting birthcontrol I've gained about 15 pounds.  I have a job but I'm in between clients right now (works as a Conservator, museum/gallery), and I'm in constant pain from my back and nothing seems to help."  She reports she lives with her 2 daughters and doesn't "really have any support."  Reports she wants to go back to school and get her GED but knows she will need medication for her ADHD so that she is able to concentrate.   Today she denies suicidal/self-harm/homicidal ideation, psychosis, paranoia, and abnormal movements.  AIMS, PHQ 2 & 9, C-SSRS, and GAD 7 screenings conducted by provider see scores below.  She reports she is overeating and sleeping without difficulty.  Reports a history of marijuana use  and tobacco use but states she is doing neither at this time.  Reports occasional alcohol use.    Recommended the following: Start Wellbutrin 150 mg daily for depression and ADHD; Start Vistaril 10 mg to 20 mg Tid prn as needed for anxiety.  Will discuss adding Buspar if Vistaril is not helpful.  She is Informed of side effect/efficacy profile on Wellbutrin and Vistaril.  She is also informed that usually takes a couple of weeks before notable  improvements are seen.  She voices her understanding with information being given to her today and is agreeable to recommendations.    Associated Signs/Symptoms: Depression Symptoms:  depressed mood, anxiety, loss of energy/fatigue, (Hypo) Manic Symptoms:  Irritable Mood, Anxiety Symptoms:  Excessive Worry, Psychotic Symptoms:   Denies PTSD Symptoms: NA  Past Psychiatric History: Depression, anxiety, ADHD  Previous Psychotropic Medications: Yes   Substance Abuse History in the last 12 months:  No.  Consequences of Substance Abuse: NA  Past Medical History:  Past Medical History:  Diagnosis Date   ADHD (attention deficit hyperactivity disorder)    Anxiety    Depression    HSV infection    Pre-eclampsia     Past Surgical History:  Procedure Laterality Date   CESAREAN SECTION N/A 11/19/2014   Procedure: CESAREAN SECTION;  Surgeon: Sherian Rein, MD;  Location: [facility];  Service: Obstetrics;  Laterality: N/A;   CESAREAN SECTION N/A 05/25/2023   Procedure: CESAREAN SECTION;  Surgeon: Lennart Pall, MD;  Location: [facility];  Service: Obstetrics;  Laterality: N/A;   INTRAUTERINE DEVICE (IUD) INSERTION N/A 05/25/2023   Procedure: POSSIBLE INTRAUTERINE DEVICE (IUD) INSERTION;  Surgeon: Lennart Pall, MD;  Location: [facility];  Service: Obstetrics;  Laterality: N/A;   WISDOM TOOTH EXTRACTION      Family Psychiatric History: See below in family history  Family History:  Family History  Problem Relation Age of Onset   Asthma Father    Depression Father    Stroke Father    Asthma Mother    Diabetes Mother    Hypertension Mother    Miscarriages / [place] Mother    Stroke Mother    Bipolar disorder Mother    Diabetes Brother    Diabetes Sister    Mental illness Sister    Miscarriages / [place] Sister    Bipolar disorder Sister    Asthma Daughter    Diabetes Maternal Aunt     Social History:   Social History   Socioeconomic History    Marital status: Single    Spouse name: Not on file   Number of children: 2   Years of education: 10   Highest education level: 10th grade  Occupational History   Occupation: Engineer, agricultural at [employer]  Tobacco Use   Smoking status: Former    Types: Cigarettes   Smokeless tobacco: Never   Tobacco comments:    Pt reports smoking only some days, but has stopped since finding out she was pregnant.   Vaping Use   Vaping status: Never Used  Substance and Sexual Activity   Alcohol use: Not Currently    Comment: Last drink in February 2024.  1 person a bottle of gray goose vodka at a time   Drug use: Not Currently    Types: Marijuana    Comment: Couple hits of a joint last use in early October 2024   Sexual activity: Not Currently    Birth control/protection: None  Other Topics Concern  Not on file  Social History Narrative   Not on file   Social Drivers of Health   Financial Resource Strain: High Risk (12/01/2022)   Overall Financial Resource Strain (CARDIA)    Difficulty of Paying Living Expenses: Hard  Food Insecurity: No Food Insecurity (05/25/2023)   Hunger Vital Sign    Worried About Running Out of Food in the Last Year: Never true    Ran Out of Food in the Last Year: Never true  Transportation Needs: No Transportation Needs (05/25/2023)   PRAPARE - Administrator, Civil Service (Medical): No    Lack of Transportation (Non-Medical): No  Physical Activity: Insufficiently Active (12/01/2022)   Exercise Vital Sign    Days of Exercise per Week: 2 days    Minutes of Exercise per Session: 20 min  Stress: Stress Concern Present (12/01/2022)   [employer] of Occupational Health - Occupational Stress Questionnaire    Feeling of Stress : Very much  Social Connections: Socially Isolated (12/01/2022)   Social Connection and Isolation Panel [NHANES]    Frequency of Communication with Friends and Family: Three times a week    Frequency of Social  Gatherings with Friends and Family: Three times a week    Attends Religious Services: Never    Active Member of Clubs or Organizations: No    Attends Banker Meetings: Never    Marital Status: Never married   Allergies:  No Known Allergies  Metabolic Disorder Labs:  Labs reviewed none since pregnancy.  Will order Lab Results  Component Value Date   HGBA1C 5.7 (H) 12/01/2022    No results found for: "PROLACTIN" No results found for: "CHOL", "TRIG", "HDL", "CHOLHDL", "VLDL", "LDLCALC" No results found for: "TSH"   Current Medications: Current Outpatient Medications  Medication Sig Dispense Refill   buPROPion (WELLBUTRIN XL) 150 MG 24 hr tablet Take 1 tablet (150 mg total) by mouth daily. 30 tablet 0   chlorhexidine (PERIDEX) 0.12 % solution Use as directed 5 mLs in the mouth or throat 2 (two) times daily.     hydrOXYzine (ATARAX) 10 MG tablet Take 1-2 tablets (10 mg to 20 mg) three times daily as needed for anxiety 30 tablet 0   acetaminophen (TYLENOL) 500 MG tablet Take 1,000 mg by mouth every 6 (six) hours as needed for moderate pain (pain score 4-6).     Blood Pressure Monitor MISC For regular home bp monitoring during pregnancy (Patient not taking: Reported on 07/18/2023) 1 each 0   fluticasone (FLONASE) 50 MCG/ACT nasal spray Place 2 sprays into both nostrils daily. 16 g 2   fluticasone (FLONASE) 50 MCG/ACT nasal spray Place 1 spray into both nostrils 2 (two) times daily. (Patient not taking: Reported on 07/18/2023) 16 g 2   ibuprofen (ADVIL) 600 MG tablet Take 1 tablet (600 mg total) by mouth every 6 (six) hours. 30 tablet 0   iron polysaccharides (NIFEREX) 150 MG capsule Take 1 capsule (150 mg total) by mouth every other day. (Patient not taking: Reported on 07/18/2023) 15 capsule 0   medroxyPROGESTERone (DEPO-PROVERA) 150 MG/ML injection Inject 1 mL (150 mg total) into the muscle every 3 (three) months. 1 mL 3   NIFEdipine (PROCARDIA XL/NIFEDICAL XL) 60 MG 24 hr tablet  Take 1 tablet (60 mg total) by mouth daily. 30 tablet 2   omeprazole (PRILOSEC) 20 MG capsule Take 1 capsule (20 mg total) by mouth daily. 1 tablet a day (Patient not taking: Reported on 07/18/2023) 30 capsule 6  SODIUM FLUORIDE 5000 PPM 1.1 % PSTE Take 1 Application by mouth daily. (Patient not taking: Reported on 07/18/2023)     valACYclovir (VALTREX) 500 MG tablet Take 1 tablet (500 mg total) by mouth daily. 34 tablet 4   Current Facility-Administered Medications  Medication Dose Route Frequency Provider Last Rate Last Admin   albuterol (PROVENTIL) (2.5 MG/3ML) 0.083% nebulizer solution 2.5 mg  2.5 mg Nebulization Once PRN Cresenzo-Dishmon, Scarlette Calico, CNM       diphenhydrAMINE (BENADRYL) injection 25 mg  25 mg Intravenous Once PRN Cresenzo-Dishmon, Scarlette Calico, CNM       EPINEPHrine (ADRENALIN) 0.3 mg  0.3 mg Intramuscular Once PRN Cresenzo-Dishmon, Scarlette Calico, CNM       methylPREDNISolone sodium succinate (SOLU-MEDROL) 125 mg in sodium chloride 0.9 % 50 mL IVPB  125 mg Intravenous Once PRN Cresenzo-Dishmon, Scarlette Calico, CNM       sodium chloride 0.9 % bolus 500 mL  500 mL Intravenous Once PRN Cresenzo-Dishmon, Scarlette Calico, CNM       sodium chloride flush (NS) 0.9 % injection 3 mL  3 mL Intravenous PRN Jacklyn Shell, CNM        Musculoskeletal: Strength & Muscle Tone:  Unable to assess via virtual visit Gait & Station: normal Patient leans: N/A  Psychiatric Specialty Exam: Review of Systems  Constitutional:        No other complaints voiced  Musculoskeletal:  Positive for back pain.  Psychiatric/Behavioral:  Positive for dysphoric mood. Negative for hallucinations, self-injury, sleep disturbance and suicidal ideas. The patient is nervous/anxious.   All other systems reviewed and are negative.   not currently breastfeeding.There is no height or weight on file to calculate BMI.  General Appearance: Casual  Eye Contact:  Good  Speech:  Clear and Coherent and Normal Rate  Volume:  Normal   Mood:  Euthymic  Affect:  Appropriate and Congruent  Thought Process:  Coherent, Goal Directed, and Descriptions of Associations: Intact  Orientation:  Full (Time, Place, and Person)  Thought Content:  WDL and Logical  Suicidal Thoughts:  No  Homicidal Thoughts:  No  Memory:  Immediate;   Good Recent;   Good Remote;   Good  Judgement:  Intact  Insight:  Present  Psychomotor Activity:  Normal  Concentration:  Concentration: Good and Attention Span: Good  Recall:  Good  Fund of Knowledge:Good  Language: Good  Akathisia:  No  Handed:  Right  AIMS (if indicated):  done  Assets:  Communication Skills Desire for Improvement Housing Leisure Time Physical Health Resilience Social Support Transportation  ADL's:  Intact  Cognition: WNL  Sleep:  Good   Screenings: AIMS    Loss adjuster, chartered Office Visit from 08/30/2023 in [facility] [place]  AIMS Total Score 0      GAD-7    Flowsheet Row Office Visit from 08/30/2023 in [facility] at [facility] from 06/19/2023 in [facility] for [facility] at [facility] from 02/19/2023 in Center for [facility] at [facility] Integrated Behavioral Health from 02/01/2023 in Center for [facility] at [facility] Initial Prenatal from 12/01/2022 in [facility] for [facility] at [facility]  Total GAD-7 Score 14 8 16 18 18       PHQ2-9    Flowsheet Row Office Visit from 08/30/2023 in [facility] at [facility] from 06/19/2023 in Center for [facility] at [facility] Clinical Support from 04/24/2023 in [facility] at [facility] Clinical Support from 04/10/2023 in [facility] Center at [facility] Visit from 03/12/2023 in [facility] at [facility] Total Score 2 0 2 2 6   PHQ-9 Total Score 7 7 10 9 17       Flowsheet Row Office Visit from 08/30/2023 in [facility] at [place] [facility] from 06/30/2023 in [facility] at [facility] Admission (Discharged) from 05/25/2023 in [place] 5S Mother Baby Unit  C-SSRS RISK CATEGORY No Risk No Risk No Risk       Assessment and Plan: Assessment: Patient seen and examined as noted above. Summary: Today DAMESHA LAWLER appears to be doing fairly well, however reporting worsening anxiety with mild depression.  Also requesting medication for ADHD wanting to go back to school to get her GED.  She denies suicidal/self-harm/homicidal ideation, psychosis, paranoia, and fluctuations in mood.    During visit she is dressed appropriate for age and weather.  She is sitting on couch holding 67 month old daughter.  There is no noted distress.  She is alert/oriented x 4, calm/cooperative and mood is congruent with affect.  She spoke in a clear tone at moderate volume, and normal pace, with good eye contact.  Her thought process is coherent, relevant, and there is no indication that she is currently responding to internal/external stimuli or experiencing delusional thought content.    1. GAD (generalized anxiety disorder) (Primary) Reporting worsening anxiety/depression symptoms.  Referral to counseling/therapy Start- hydrOXYzine (ATARAX) 10 MG tablet; Take 1-2 tablets (10 mg to 20 mg) three times daily as needed for anxiety  Dispense: 30 tablet; Refill: 0 - CBC with Differential - Comprehensive metabolic panel with GFR - TSH - HgB A1c - Prolactin - Lipid panel - Pregnancy, urine - Drug Screen, Urine  2. Major depressive disorder, recurrent, mild (HCC) Reporting worsening anxiety/depression symptoms.  Referral to counseling/therapy Start- buPROPion (WELLBUTRIN XL) 150 MG 24 hr tablet; Take 1 tablet (150 mg total) by mouth daily.   Dispense: 30 tablet; Refill: 0  3. Attention deficit hyperactivity disorder (ADHD), unspecified ADHD type Treated for ADHD when a child.  Wanting to be restarted on medication for focus and concentration because planning to return to school to obtain GED Start- buPROPion (WELLBUTRIN XL) 150 MG 24 hr tablet; Take 1 tablet (150 mg total) by mouth daily.  Dispense: 30 tablet; Refill: 0   Plan: Medications: Meds ordered this encounter  Medications   buPROPion (WELLBUTRIN XL) 150 MG 24 hr tablet    Sig: Take 1 tablet (150 mg total) by mouth daily.    Dispense:  30 tablet    Refill:  0    Supervising Provider:   Kathryne Sharper T [[id number]]   hydrOXYzine (ATARAX) 10 MG tablet    Sig: Take 1-2 tablets (10 mg to 20 mg) three times daily as needed for anxiety    Dispense:  30 tablet    Refill:  0    Supervising Provider:   Kathryne Sharper T [[id number]]    Labs:  No labs since delivery of 34 month old.  Labs ordered. Lab Orders         CBC with Differential         Comprehensive metabolic panel with GFR         TSH         HgB A1c  Prolactin         Lipid panel         Pregnancy, urine         Drug Screen, Urine     Other:  Follow up on referral for counseling/therapy.   MARAL LAMPE is instructed to call [phone], mobile crisis, or present to the nearest emergency room should she experience any suicidal/homicidal ideation, auditory/visual/hallucinations, or detrimental worsening of her mental health condition.   TWANIA BUJAK has participated in the development of this treatment plan and verbalized her agreement with plan as listed.  Follow Up: Return in 2 weeks for medication management Call in the interim for any side-effects, decompensation, questions, or problems  Collaboration of Care: Medication Management AEB Started Wellbutrin and Vistaril and Referral or follow-up with counselor/therapist AEB Referral to counseling/therapy  Patient/Guardian was advised Release of Information  must be obtained prior to any record release in order to collaborate their care with an outside provider. Patient/Guardian was advised if they have not already done so to contact the registration department to sign all necessary forms in order for [place] to release information regarding their care.   Consent: Patient/Guardian gives verbal consent for treatment and assignment of benefits for services provided during this visit. Patient/Guardian expressed understanding and agreed to proceed.   Permelia Bamba, NP 3/27/202512:02 PM

## 2023-08-30 NOTE — Patient Instructions (Addendum)
 Labs have been ordered since it has been a while since you had any.  Labs are checked at least yearly and more frequently depending on prescribed medication.  Routine blood work is an important part of assessing a patient's overall health and to rule out any condition that is not psychiatric related.  Tests such as a complete blood count, metabolic panel, lipid panel, thyroid function tests, and hemoglobin A1c test (screening for diabetes) can help a psychiatrist understand the general medical health of a patient.  Blood work can help make informed decisions about a potential differential diagnosis and help understand other factors that may be the correct reason for the presentation.  In some cases, blood work can aid in the diagnosis, medication management, and/or treatment of your a mental health. Please have labs drawn prior to next scheduled visit.   Routine Labs checked at least yearly.  May need to be checked more often depending on prescribed medications.   CBC with Differential/Platelet    Comprehensive metabolic panel    Hemoglobin A1c    Magnesium    Ethanol    Urinalysis, Routine w reflex microscopic Urine, Clean Catch    Pregnancy, urine (females of child bearing age) POCT Urine Drug Screen:  If prescribed any controlled substance  Also recommend EKG prior to starting psychotropic medications as a baseline and periodically after starting psychotropic medications to monitor for prolong QTc which can indicate the presence of cardiac risk factors.  Studies have shown that some psychotropic medications can increase the risk of prolong QTc.  Please have your primary care provider to do EKG at your next scheduled visit and send results if not available on Epic.        Call 911, 988, mobile crisis, or present to the nearest emergency room should you experience any suicidal/homicidal ideation, auditory/visual/hallucinations, or detrimental worsening of your mental health.  Mobile Crisis  Response Teams Listed by counties in vicinity of [facility] providers [facility] [phone] [facility] [phone] [facility] [phone] [facility] [phone] [place]                * [facility] [phone]                * [facility] [phone]  [facility] [phone] [facility]  [phone] * [facility] [phone]      Here are a List of outpatient providers that accept Medicaid and private insurance.  They offer counseling/therapy virtually some also offer in person:    [facility] Phone: [phone] Physical Address:  [address], [address] [place], [place]  [address]  Outpatient Services Life can be a challenge for [place] all. [facility] outpatient services offer a caring and experienced team of professionals who help people take the first step, which is often the most difficult. Together, we develop a well-defined and customized plan for each person that meets the individual's needs and goals. Each plan includes evidence-based practices as proven strategies that work. From board-certified psychiatrists, registered nurses, therapists, and outpatient office administrative professionals--all care and want to help you and your loved ones in every way possible to ensure you succeed.  Open Access:   One way we ensure we get people the help they need when they request is is through Open Access. This service encourages individuals who are in dire need of our services and  are new to [facility] to simply walk in or call us for virtual options, Monday through Friday between 8 a.m. and 3 p.m. On the same day of contact, if the individual has time to do so, he/she/they will  complete patient registration and a comprehensive clinical assessment with a therapist. The assessment will provide treatment recommendations and the individual will leave with an appointment for the next service or a referral to the proper level of care.  While this process takes a few hours and is longer than a traditional appointment, it reduces what could otherwise be months of waiting for help or an appointment.   Telehealth Services:  [facility] telehealth services provide a safe, secure, and easy way to connect with a therapist or mental health provider for an individual or group therapy appointment. Click here to learn more about how [facility] telehealth services provide an important treatment option. These services may be accessed from the comfort of an individual's home, or at one of [facility]'s behavioral health offices such as this one where an individual may use on-site equipment for the visit.   Telehealth Services   A SAFE, SECURE, CONVENIENT TREATMENT OPTION:  [facility] telehealth services provide you with a safe, secure, and easy way to connect with your therapist or mental health provider for an individual or group therapy appointment.  Using Psychologist, prison and probation services, telehealth appointments allow you to meet with [employer], therapists, nurse practitioners, and psychiatrists from your desktop or laptop computer, cell phone, or tablet device. Telehealth visits are compliant with all Health Insurance Portability and Accountability Act (HIPAA) requirements and you can complete a telehealth visit from just about anywhere using internet or wi-fi access.  HOW DOES IT WORK?  [facility] uses the Doxy.me platform to host telehealth appointments. Prior to your scheduled visit, you will receive a direct link via text or email which will take you to your provider's online waiting room. Simply click that link at your appointment time and your provider will be notified that you've arrived. He or she  will meet you online and you will complete your visit. Your provider may also have resources and information posted in his or her virtual waiting room which you may find helpful throughout your treatment.  In addition, you may receive a reminder telephone call from a [place] team member in the days leading up to your appointment. During that call, you will have an opportunity to provide important health information and medication updates which may save time during your scheduled appointment.     WHO USES TELEHEALTH SERVICES?  Telehealth services provide an alternative to in-person, face-to-face treatment for individuals receiving outpatient behavioral health services. At [facility], telehealth visits may also be used by individuals receiving Assertive Community Treatment (ACT) Team and Individual Placement and Support (IPS) services and other community-based, specialized services as needed. Telehealth services are also used for group therapy sessions, allowing people we support to connect during treatment with others who have similar experiences.       Address:  [address] [place], [place] [address] Outpatient Hours: Mon-Fri 8AM to 5PM Phone: [phone] Fax: [phone] Offers: Behavioral Health Urgent Care [facility]) Hours: 24/7 Phone: [phone] Fax: [phone] --- Address:  [address] [place], [place] [address] Hours: Mon-Fri 8AM-5PM Phone: [phone] Fax: [phone]  Address: [address], [address] [place], [place] [address] Hours: Outpatient: Mon-Fri 8AM to 5PM Behavioral Health Urgent Care [facility]) Hours: 24/7 Phone: [phone] Fax: [phone]  Services: Adult Services Advanced Access Walk-In Assessments -  All Disabilities If you're a walk-in patient there is generally a wait time involved on a "first come, first served basis" otherwise contact us beforehand to setup an appointment. If you're in crisis please use our 24-Hour Crisis Hotline for  immediate access to a clinician. If this is your first time, please bring the following with you:   Insurance/Medicaid Card  ID or Social Security Card Any referral documentation Routine Outpatient Therapy Psychiatry/Med Management Substance Abuse Intensive Outpatient (SAIOP) Medication Assisted Treatment - MAT (Suboxone) Tailored Care Management (TCM) Youth Services Advanced Access Walk-In Assessments - All Disabilities Routine Outpatient Therapy Psychiatry/Med Management Tailored Care Management (TCM)   Behavioral Health Care in [place], [place] [facility] in [place], [place] provides Integrated Behavioral Health Services to people of all ages. The Integrated Behavioral Health Program applies an evidence-based approach to integrate behavioral health into primary care. This model incorporates mental health treatment into a traditional medical visit. Our philosophical standard values comprehensive wellness for patients. The program is led by Dale Assumption, [facility] Director. Aimee is a Administrator, Civil Service (LCSW). Our organizational vision in implementing this program is to improve patient wellness by serving as a Publishing rights manager for thorough healthcare management. [facility] provides high-quality care for behavioral health patients of all ages, including: Common mental health diagnoses such as Anxiety, Depression, ADD/ADHD, Bipolar, and PTSD Substance Abuse Evaluations and Counseling NARCAN/Nalox[employer] Distribution Our Behavioral Health Clinicians [facility]) are ready to help you problem-solve through life stressors to promote healthy coping skills. Together we can identify how past challenges, such as traumatic events, are contributing to how you're functioning today. Our BHCs are happy to incorporate the principles of your value system to nurture your healing needs. We offer HIPAA-compliant  trauma-informed telehealth and in-person therapy to residents of [place] and [place] at our [place], [place], and [place], [place] medical centers. We look forward to meeting and working with you.  We'd love to hear from you!  OUR CONTACT INFO Address:  [address] [place], [place] [address] Hours Operation Monday 8:00AM - 5:00PM Tuesday 8:00AM - 7:00PM Wednesday 8:00AM - 5:00PM Thursday 8:00AM - 5:00PM Friday 8:00AM - 12:00PM  CALL us P: [phone] F: [phone] Email:  [email or web address]  Facebook/Messenger:  [email or web address] , [email or web address]   Hospital doctor:  [email or web address]   Get better, faster with quality mental health care Our providers take a hands-on approach to help you see improvement at every step, no matter how severe your symptoms. Just come as you are and let us take it from there.  Appointments in as little as 2 days Receive quick guidance and support by speaking with a licensed professional in a matter of hours.  Care for even the most severe cases We offer care for mild to severe depression, anxiety, and more --including Crisis Care for adults with elevated suicide risk.  Personalized plans unique to you Our treatment plans are tailored to your specific needs, providing you a clear path to success.  1:1 support from start to finish Get paired with a dedicated provider to serve as your single point of contact throughout treatment.  Results-based care, built for you Your expert provider will ensure your plan is grounded in data, lending support from beginning to end. Choose psychiatry, therapy, or both.  Psychiatry When medication is necessary, our psychiatric providers get it right fast --analyzing 100+ data points to determine which treatment is likely to be most tolerable and effective for you.  Therapy Our virtual program combines cognitive and behavioral therapy with independent skill practice--all of which have  been clinically proven to work for a wide range of symptoms so you can get better, and stay better.  Crisis Care A first-of-its-kind program for individuals with elevated suicide risk, Crisis Care is based on the Collaborative Assessment and Management of Suicidality (CAMS) framework--a care model that's backed by 30 years of research.  Teen Care (new) Give your teen a safe space to connect and get personal support. Our expert therapists help teens 13 and up with many common concerns--from school and peer pressure to anxiety and friendships. Psychiatry services available, if appropriate.  Virtual, dedicated support every step of the way 1:1 Video Sessions Let your provider know how you're feeling, get to know you, and provide 1:1 support.  Anytime Messaging Get questions or concerns off your chest between video visits by messaging your provider at any time.  Interactive Lessons Learn how to integrate new thought and behavior patterns into your daily life.  Proactive Progress Tracking Complete weekly check-ins so your provider can track your progress and, if necessary, adjust your treatment and/or medication.  1:1 Video Sessions Let your provider know how you're feeling, get to know you, and provide 1:1 support.   [facility] [employer], [place].  Address:  [address] [place], [place] [address]  Phone:  [phone] Website:  [email or web address] We are here to serve clients ages 50 - 53, who are challenged by a multitude of behavioral health concerns to include substance use disorders, and complex mental health scenarios where both medical and psychiatric conditions coexist. Moreover, as a trained Healthcare Chaplain, I seek to provide incarnation al care. The aim of " [facility]" is to be incarnation al. Therefore, to provide a holistic treatment approach to all who call upon our services.  Behavioral Health Services & Treatments Depression  Substance  Use Disorders  Mood Disorders  Psychodynamic Psychotherapy  [place] Charity fundraiser) Therapy Anxiety Disorders ADHD Urine Toxicology Screening Psychiatric Medication Management  Insurance [address], [address], [address], [address] and [place], [address] and [employer], [place], [place], [place], [employer], Control and instrumentation engineer, [employer], Cardinal [employer] (UMR), UnitedHealthcare UHC  UBH, [employer] of Network    Therapist, sports Health Counselor Associate, [place], [facility], CCTP Available both in-person and online [address] [place], [place] [address]  Phone:  [phone]  Specialties and Soil scientist Anxiety Depression Coping Skills Expertise ADHD Behavioral Issues Oppositional Defiance (ODD) School Issues Self Esteem Stress Trauma and PTSD  How are you doing? Not what you tell others, Be honest with yourself How are you really doing?Marland Kitchen. Let's talk about what's really on your mind. I specialize in supporting individuals, children, and families as they navigate life's challenges and work toward healing. My background includes experience in crisis support as a first responder, which has deepened my understanding of how acute and long-term stress impacts mental health. I use a person-centered approach, ensuring that your unique strengths, experiences, and goals guide our work together. My therapeutic style is eclectic, drawing from evidence-based modalities such as Cognitive Behavioral Therapy (CBT), Trauma-Focused Therapy, mindfulness practices, and somatic techniques to create a personalized path toward wellness.   Susa Simmonds Licensed Clinical Mental Health Counselor Associate, LCMHC-A, [facility] Available both in-person and online Location:  [place] - [address], [place], [place], [address] Phone: [phone] Website:  [email or web address] Hello! I'm Susa Simmonds, LCMHC-A, NCC. I  earned my Master's in Clinical Mental  Health Counseling and a Certificate in Marriage and Family Counseling from [facility] [employer]. My experience includes roles as a Science writer for individuals on the Autism Spectrum. I gained most of my clinical experience at a children's center, collaborating with a multidisciplinary team to provide top-notch care. I'm passionate about working with children, adolescents, young adults, and families facing anxiety, depression, trauma, self-esteem issues, and relational challenges. I use play therapy and expressive art techniques when traditional talk therapy isn't effective. My approaches include cognitive-behavioral, solution-focused, trauma-focused therapies, and MATCH-ADTC (Modular Approach to Therapy for Children with Anxiety, Depression, Trauma, and Conduct problems). As your therapist, I'll tailor support to your needs and offer at-home activities to help you achieve your goals. My focus is on fostering self-awareness, growth, and confidence in a supportive environment of hope and empowerment. Outside work, I enjoy reading, trying new foods, and spending time with loved ones. Conditions Treated ADHD Anxiety/Phobias/Panic attacks Autism Bipolar disorder Childhood behavioral issues Couple's issues Depression Family Focus and concentration LGBTQ+ Obsessions or compulsions Postpartum or Peripartum Issues PTSD or Trauma Stress management   Nira Conn Counselor, [facility], LCASA, NCC (she, her) Available online only Location:  [place], [place] [address] Phone:  [phone] My clients can expect an environment free of judgment, full of understanding and empathy, where they can face the challenges that they have in a supportive environment. My personal background allows me to look at client issues from a different view and offer real world healing that meets the client where they are. My client's come from  various backgrounds, but all seeking to better understand themselves and a better way of coping. Because I'm seeing clients virtually, they feel freer to share more intimate and personal concerns that might be harder to talk about in person. Top Specialties LGBTQ+ Sex Therapy Expertise Addiction ADHD Anxiety Behavioral Issues Bipolar Disorder Body Positivity Borderline Personality (BPD) Cancer Child Chronic Pain Coping Skills Depression Life Transitions Marital and Premarital Obesity Open Relationships Non-Monogamy Parenting Peer Relationships Relationship Issues Self Esteem Sex-Positive, Kink Allied Sexual Abuse Sexual Addiction Stress Trauma and PTSD Weight Loss   [employer] Professional, MDiv, Med Available online only [address] [place], [place] [address] Phone:  [phone] Website:  [email or web address] Welcome! My name is Brewing technologist Scales, and I am passionate about helping individuals navigate life's challenges, develop resilience, and unlock their full potential. With over a decade of experience in counseling, ministry, and coaching, I provide a holistic approach to personal and spiritual development, drawing on my diverse professional background and extensive training. Administrator Spirituality Education and Learning Disabilities Mood Disorders Expertise ADHD Anxiety Pension scheme manager Counseling Child Coping Skills Depression Family Conflict First Responders Grief Intellectual Disability Life Coaching Life Transitions Marital and Premarital Parenting Peer Relationships Relationship Issues School Issues Self Esteem Sexual Abuse Teen Violence Trauma and PTSD   Frederic Jericho, MSW, LCSW, [facility] [address] [place], [place] [address] Office [phone] Fax [phone] Sometimes in life trying situations occur that may be difficult to handle alone. My goal is to  help you successfully manage these challenges so that you can experience a more balanced and fulfilling life. I offer an eclectic approach to therapy, which includes solution-focused, reality-based, person-centered approaches. I will assist you in breaking the cycle of negative thinking and behaviors. If you are in need of support or guidance in order to make your life more manageable, I welcome the opportunity  to assist you in doing so. My goal is to empower you to live a life of personal growth and positive well-being. Please call [phone] or email [email or web address] for an individual and family therapy or consultation today.   Contact Details  Locate [place] at [address], [place], [place] [address] [address] [place] [address] [place], [place] [address]  We are a full telehealth service company as we do not do in person visits.  Message or Call us at  Email:  [email or web address]   Phone: [phone]  FAX: [phone]  Who We Are [facility] is a leading provider of personalized mental health care in [place], [place], dedicated to fostering healing and growth in a safe, compassionate environment. Our team of experienced professionals offers innovative solutions tailored to meet the unique needs of each individual, helping them overcome challenges and improve their overall quality of life. We accept most major insurances.  You Are Our Top Priority  Mission Statement [facility] offers personalized mental health care in a safe and compassionate environment. Our team provides effective treatments and innovative solutions that empower clients to overcome challenges and improve their quality of life.  Vision Statement [facility] aims to redefine mental health care standards through excellence, compassion, and innovation. We aim to make mental health care accessible and destigmatized, ensuring everyone receives the necessary support to  thrive.  Services: Anxiety Depression Bipolar disorder Personality Disorder PTSD ADHD Schizophrenia Mood disorder Insomnia Neuropsychological Testing for ADHD and Other Disorders  Comprehensive neuropsychological testing for a better tomorrow Understanding Neuropsychological Testing Neuropsychological testing is a specialized evaluation method used to assess cognitive functions, behaviors, and mental processing abilities. These tests are essential for diagnosing and managing conditions such as Attention-Deficit/Hyperactivity Disorder (ADHD), learning disabilities, autism spectrum disorders, mood disorders, and other neurological or psychological conditions. Unlike traditional assessments, neuropsychological testing provides objective, measurable data about an individual's cognitive abilities. This helps clinicians develop personalized treatment plans that address specific challenges and improve daily functioning.  Why Neuropsychological Testing is Important for ADHD ADHD is a complex neurodevelopmental disorder that affects attention, impulsivity, and executive function. Because its symptoms often overlap with other conditions, neuropsychological testing helps differentiate ADHD from other disorders, ensuring an accurate diagnosis and appropriate intervention. Through comprehensive testing, clinicians can evaluate:  Attention and Focus - Identifying difficulties in sustained and selective attention Executive Functioning - Assessing skills such as impulse control, problem-solving, and organization Memory and Processing Speed - Measuring how quickly and efficiently information is understood and retained Behavioral and Emotional Regulation - Understanding mood-related symptoms that may be associated with ADHD Key Neuropsychological Tests Neuropsychological assessments typically include a combination of standardized tests that evaluate different cognitive domains. Some of the most commonly  used tests include:  Continuous Performance Test (CPT) Measures sustained attention and response control. Helps identify inattention, impulsivity, and distractibility. Stroop Test Assesses cognitive flexibility and processing speed. Evaluates an individual's ability to control automatic responses and focus on specific tasks. [employer] Test (WCST) Measures executive function, problem-solving, and cognitive flexibility. Helps assess an individual's ability to adapt to changing rules and instructions. Trail Making Test (TMT) Assesses visual attention, processing speed, and task-switching abilities. Commonly used to detect cognitive impairments in ADHD and other disorders. N-Back Test Evaluates working memory and attentional capacity. Helps measure an individual's ability to hold and manipulate information over short periods.  Neuropsychological Testing for Other Disorders Beyond ADHD, neuropsychological testing is a crucial tool for diagnosing and managing a wide range of  neurological and psychological conditions. These assessments can help identify cognitive deficits and guide targeted interventions for various disorders, including: Learning Disabilities Helps assess difficulties in reading, writing, or mathematics Identifies underlying cognitive challenges that may be affecting academic performance Autism Spectrum Disorder (ASD) Evaluates cognitive flexibility, attention, and executive functioning Helps determine the presence of social and communication deficits Mood Disorders (Depression, Anxiety, Bipolar Disorder) Identifies cognitive impairments related to emotional regulation and stress response Assesses memory, attention, and problem-solving skills affected by mood disorders Traumatic Brain Injury (TBI) and Concussions Measures cognitive changes due to brain injuries Helps monitor recovery progress and guide rehabilitation efforts Dementia and Neurodegenerative  Disorders Evaluates memory, processing speed, and executive function in conditions like Alzheimer's and Parkinson's disease Assists in early detection and progression monitoring  Benefits of Neuropsychological Testing Accurate Diagnosis - Differentiates ADHD from other conditions with similar symptoms, such as anxiety or learning disabilities Personalized Treatment Planning - Identifies cognitive strengths and weaknesses to tailor individualized intervention Tracking Progress - Enables clinicians to monitor changes over time and assess the effectiveness of treatments or medication Guiding Educational and Workplace Accommodations - Provides data to support school or workplace modifications for individuals struggling with cognitive challenges  Who Should Consider Neuropsychological Testing? Individuals experiencing difficulties with attention, memory, problem-solving, or emotional regulation may benefit from neuropsychological testing. This assessment is ideal for: Children and adults with suspected ADHD or learning disabilities Individuals with behavioral or emotional challenges affecting daily life Those recovering from neurological injuries or illnesses impacting cognitive function Anyone seeking a deeper understanding of their cognitive abilities to optimize performance in school, work, or daily activities  What to Expect During a Neuropsychological Evaluation The testing process typically includes: Clinical Interview - Gathering medical and developmental history Standardized Testing - Completing various cognitive tasks on a computer or paper Behavioral Assessments - Using rating scales and questionnaires completed by the individual and/or caregivers Comprehensive Report - A detailed breakdown of strengths, weaknesses, and personalized recommendations    Offers Virtual Therapy  [address] [place], [place] [address]  Phone:  [phone] Fax: [phone]  [facility]  Outpatient Program (OP) An Outpatient Treatment Program, or OP, is a service for individuals seeking support for substance abuse or mental health concerns who do not require frequent or intense support or safety monitoring. This level is appropriate for people with less severe disorders, or as a step-down from more intensive services. At [facility], OP is available for individuals aged 54 and above. It consists of basic treatment services offered in individual and group format and totals to less than 9 hours a week. Both virtual and in-person options for attending are available. OP is conducted by treatment professionals licensed to serve individuals with mental health and substance use disorders within [place]. OP helps individuals address a broad range of psychological and interpersonal challenges including: Substance Related Disorders Behavioral Addictions Anxiety Depression Trauma and Stressor Related Issues PTSD Parenting and Family Issues Relationship Issues Stress Self-Esteem Bipolar Disorders Life Transitions Personality Disorders ADHD Grief and Loss  *[facility] is proud to offer specialized treatment services on an outpatient level including, EMDR, Marriage/Family Counseling and Christian Counseling  We currently accept all major insurance, including: Medicaid,Uninsured, [employer], [address], [place], [employer], [place], [employer], [employer], [employer], [employer] Phone:  [phone] Website:  [email or web address]           How to get started with virtual  therapy covered by Medicaid: Medicaid-covered members can call our Admissions Team 24/7 or fill out our online form to learn about their plan's specific benefits and get started with treatment. Once we verify your Medicaid benefits, our Clinical Team will conduct a thorough mental health assessment to create  your personalized treatment plan. Medicaid members can get started with their personalized treatment plan (which includes curated groups, individual therapy, and family therapy) in as little as 24 hours. Call [phone]  What we Treat:  Anxiety Treatment for Teens and Adults  Our therapists specialize in cognitive behavioral therapy, a leading anxiety treatment, to provide evidence-based mental healthcare for teens and adults dealing with anxiety disorders. Fill out the short form below or call us directly to start healing from anxiety today with [facility].  Depression Treatment for Teens and Adults Depression affects millions of people worldwide, but healing is possible with evidence-based treatment.   Trauma Treatment for Teens and Adults After surviving trauma, building connections and receiving trauma-informed care are critical for long-lasting healing. That's why [facility] offers trauma-informed therapy in individual and group sessions.   Self-Harm Treatment for Teens and Adults Self-harm is often linked to serious mental health issues, which is why understanding the root of self-harm is key to long-lasting recovery.   Suicidal ideation Passive suicidal ideation, chronic suicidal ideation, previous suicide attempt  Substance Use Disorders Treatment for Teens and Adults:   Alcohol, marijuana, prescription drugs, opioids, amphetamines, cocaine, inhalants, hallucinogens, nicotine Understanding the mental health roots of substance use disorders (SUD) is key to long-lasting recovery.

## 2023-09-24 ENCOUNTER — Other Ambulatory Visit (HOSPITAL_COMMUNITY): Payer: Self-pay | Admitting: Registered Nurse

## 2023-09-24 DIAGNOSIS — F330 Major depressive disorder, recurrent, mild: Secondary | ICD-10-CM

## 2023-09-24 DIAGNOSIS — F909 Attention-deficit hyperactivity disorder, unspecified type: Secondary | ICD-10-CM

## 2023-09-26 ENCOUNTER — Encounter: Payer: Self-pay | Admitting: Obstetrics & Gynecology

## 2023-10-15 ENCOUNTER — Ambulatory Visit: Payer: Medicaid Other

## 2023-10-27 ENCOUNTER — Other Ambulatory Visit: Payer: Self-pay | Admitting: Obstetrics & Gynecology

## 2023-11-21 ENCOUNTER — Ambulatory Visit: Admitting: *Deleted

## 2023-11-21 ENCOUNTER — Other Ambulatory Visit (HOSPITAL_COMMUNITY)
Admission: RE | Admit: 2023-11-21 | Discharge: 2023-11-21 | Disposition: A | Discharge status: Home / Self Care | Source: Ambulatory Visit | Attending: Obstetrics & Gynecology | Admitting: Obstetrics & Gynecology

## 2023-11-21 DIAGNOSIS — Z3202 Encounter for pregnancy test, result negative: Secondary | ICD-10-CM | POA: Diagnosis not present

## 2023-11-21 DIAGNOSIS — Z3042 Encounter for surveillance of injectable contraceptive: Secondary | ICD-10-CM

## 2023-11-21 DIAGNOSIS — N939 Abnormal uterine and vaginal bleeding, unspecified: Secondary | ICD-10-CM

## 2023-11-21 DIAGNOSIS — R300 Dysuria: Secondary | ICD-10-CM | POA: Diagnosis present

## 2023-11-21 DIAGNOSIS — N926 Irregular menstruation, unspecified: Secondary | ICD-10-CM

## 2023-11-21 LAB — POCT URINALYSIS DIPSTICK OB
Glucose, UA: NEGATIVE
Ketones, UA: NEGATIVE
Leukocytes, UA: NEGATIVE
Nitrite, UA: NEGATIVE

## 2023-11-21 LAB — POCT URINE PREGNANCY: Preg Test, Ur: NEGATIVE

## 2023-11-21 MED ORDER — MEDROXYPROGESTERONE ACETATE 150 MG/ML IM SUSY
150.0000 mg | PREFILLED_SYRINGE | Freq: Once | INTRAMUSCULAR | Status: AC
  Administered 2023-11-21: 150 mg via INTRAMUSCULAR

## 2023-11-21 NOTE — Progress Notes (Signed)
   NURSE VISIT- UTI SYMPTOMS   SUBJECTIVE:  Donna Conrad is a 28 y.o. G32P1102 female here for UTI symptoms. She is a GYN patient. She reports dysuria. Also requesting STD screening and depo restart. Has been spotting off/on since Depo was due 5/19.  OBJECTIVE:  There were no vitals taken for this visit.  Appears well, in no apparent distress  Results for orders placed or performed in visit on 11/21/23 (from the past 24 hours)  POC Urinalysis Dipstick OB   Collection Time: 11/21/23 10:08 AM  Result Value Ref Range   Color, UA     Clarity, UA     Glucose, UA Negative Negative   Bilirubin, UA     Ketones, UA neg    Spec Grav, UA     Blood, UA 3+    pH, UA     POC,PROTEIN,UA Small (1+) Negative, Trace, Small (1+), Moderate (2+), Large (3+), 4+   Urobilinogen, UA     Nitrite, UA neg    Leukocytes, UA Negative Negative   Appearance     Odor    POCT urine pregnancy   Collection Time: 11/21/23 10:08 AM  Result Value Ref Range   Preg Test, Ur Negative Negative    ASSESSMENT: GYN patient with UTI symptoms and negative nitrites Depo provera  given today CV swab sent  PLAN: Note routed to Benny Braver, CNM, [facility]   Rx sent by provider today: No Urine culture sent Call or return to clinic prn if these symptoms worsen or fail to improve as anticipated. Follow-up: 11-12 weeks for next depo   Laverne Potter  11/21/2023 10:11 AM

## 2023-11-22 ENCOUNTER — Encounter: Payer: Self-pay | Admitting: Emergency Medicine

## 2023-11-22 ENCOUNTER — Other Ambulatory Visit: Payer: Self-pay

## 2023-11-22 ENCOUNTER — Ambulatory Visit
Admission: EM | Admit: 2023-11-22 | Discharge: 2023-11-22 | Disposition: A | Discharge status: Home / Self Care | Attending: Family Medicine | Admitting: Family Medicine

## 2023-11-22 DIAGNOSIS — L03039 Cellulitis of unspecified toe: Secondary | ICD-10-CM | POA: Diagnosis not present

## 2023-11-22 LAB — CERVICOVAGINAL ANCILLARY ONLY
Bacterial Vaginitis (gardnerella): NEGATIVE
Candida Glabrata: NEGATIVE
Candida Vaginitis: NEGATIVE
Chlamydia: NEGATIVE
Comment: NEGATIVE
Comment: NEGATIVE
Comment: NEGATIVE
Comment: NEGATIVE
Comment: NEGATIVE
Comment: NORMAL
Neisseria Gonorrhea: NEGATIVE
Trichomonas: NEGATIVE

## 2023-11-22 MED ORDER — CEPHALEXIN 500 MG PO CAPS: 500.0000 mg | ORAL_CAPSULE | Freq: Two times a day (BID) | ORAL | 0 refills | Status: AC

## 2023-11-22 MED ORDER — CHLORHEXIDINE GLUCONATE 4 % EX SOLN
Freq: Every day | CUTANEOUS | 0 refills | Status: AC | PRN
Start: 2023-11-22 — End: ?

## 2023-11-22 MED ORDER — MUPIROCIN 2 % EX OINT: 1.0000 | TOPICAL_OINTMENT | Freq: Two times a day (BID) | CUTANEOUS | 0 refills | Status: AC

## 2023-11-22 NOTE — ED Notes (Signed)
 L toe cleaned with Hebiclens and sterile water . Bacitracin and non-adhesive dressing and coband applied. Pt tolerated well. Pt provided instructions on how to properly clean wound at home.

## 2023-11-22 NOTE — ED Provider Notes (Signed)
 [facility]    CSN: [id number] Arrival date & time: 11/22/23  1649      History   Chief Complaint Chief Complaint  Patient presents with   Toe Pain    HPI Donna Conrad is a 28 y.o. female.   Patient presenting today with 1 week history of swelling, bleeding, drainage, pain to the left great toenail edge after pulling a piece of skin off the area.  Denies fever, chills, decreased range of motion, numbness, tingling.  So far has been covering the area with a Band-Aid with no relief.    Past Medical History:  Diagnosis Date   ADHD (attention deficit hyperactivity disorder)    Anxiety    Depression    HSV infection    Pre-eclampsia     Patient Active Problem List   Diagnosis Date Noted   Chronic hypertension 07/18/2023   S/P cesarean section 05/25/2023   History of anemia 03/28/2023   Major depressive disorder, recurrent episode, moderate (HCC) 03/12/2023   Generalized anxiety disorder 03/12/2023   PTSD (post-traumatic stress disorder) 03/12/2023   Psychophysiologic insomnia 03/12/2023   Cannabis use disorder 03/12/2023   Herpes 12/05/2022   Prediabetes 12/04/2022   History of pre-eclampsia 11/30/2022    Past Surgical History:  Procedure Laterality Date   CESAREAN SECTION N/A 11/19/2014   Procedure: CESAREAN SECTION;  Surgeon: Margaretmary Shaver, MD;  Location: [facility];  Service: Obstetrics;  Laterality: N/A;   CESAREAN SECTION N/A 05/25/2023   Procedure: CESAREAN SECTION;  Surgeon: Izell Marsh, MD;  Location: [facility];  Service: Obstetrics;  Laterality: N/A;   INTRAUTERINE DEVICE (IUD) INSERTION N/A 05/25/2023   Procedure: POSSIBLE INTRAUTERINE DEVICE (IUD) INSERTION;  Surgeon: Izell Marsh, MD;  Location: [facility];  Service: Obstetrics;  Laterality: N/A;   WISDOM TOOTH EXTRACTION      OB History     Gravida  2   Para  2   Term  1   Preterm  1   AB      Living  2      SAB      IAB      Ectopic      Multiple   0   Live Births  2            Home Medications    Prior to Admission medications   Medication Sig Start Date End Date Taking? Authorizing Provider  cephALEXin  (KEFLEX ) 500 MG capsule Take 1 capsule (500 mg total) by mouth 2 (two) times daily. 11/22/23  Yes Corbin Dess, PA-C  chlorhexidine (HIBICLENS) 4 % external liquid Apply topically daily as needed. 11/22/23  Yes Corbin Dess, PA-C  mupirocin ointment (BACTROBAN) 2 % Apply 1 Application topically 2 (two) times daily. 11/22/23  Yes Corbin Dess, PA-C  acetaminophen  (TYLENOL ) 500 MG tablet Take 1,000 mg by mouth every 6 (six) hours as needed for moderate pain (pain score 4-6).    [provider]  Blood Pressure Monitor MISC For regular home bp monitoring during pregnancy Patient not taking: Reported on 07/18/2023 12/01/22   Abner Ables, MD  buPROPion  (WELLBUTRIN  XL) 150 MG 24 hr tablet Take 1 tablet (150 mg total) by mouth daily. 08/30/23   Rankin, Shuvon B, NP  chlorhexidine (PERIDEX) 0.12 % solution Use as directed 5 mLs in the mouth or throat 2 (two) times daily. 08/01/23   [provider]  fluticasone  (FLONASE ) 50 MCG/ACT nasal spray Place 2 sprays into both nostrils  daily. 05/28/23   Salomon Cree, CNM  fluticasone  (FLONASE ) 50 MCG/ACT nasal spray Place 1 spray into both nostrils 2 (two) times daily. Patient not taking: Reported on 07/18/2023 06/30/23   Corbin Dess, PA-C  hydrOXYzine  (ATARAX ) 10 MG tablet Take 1-2 tablets (10 mg to 20 mg) three times daily as needed for anxiety 08/30/23   Rankin, Shuvon B, NP  ibuprofen  (ADVIL ) 600 MG tablet Take 1 tablet (600 mg total) by mouth every 6 (six) hours. 07/18/23   Jolayne Natter, CNM  iron  polysaccharides (NIFEREX) 150 MG capsule Take 1 capsule (150 mg total) by mouth every other day. Patient not taking: Reported on 07/18/2023 06/14/23   Cresenzo-Dishmon, Blanca Bunch, CNM  medroxyPROGESTERone  (DEPO-PROVERA ) 150 MG/ML  injection Inject 1 mL (150 mg total) into the muscle every 3 (three) months. 07/23/23   Cresenzo-Dishmon, Blanca Bunch, CNM  NIFEdipine  (PROCARDIA  XL/NIFEDICAL XL) 60 MG 24 hr tablet Take 1 tablet (60 mg total) by mouth daily. 07/18/23   Jolayne Natter, CNM  omeprazole  (PRILOSEC) 20 MG capsule TAKE 1 CAPSULE BY MOUTH DAILY 10/29/23   Wendelyn Halter, MD  SODIUM FLUORIDE 5000 PPM 1.1 % PSTE Take 1 Application by mouth daily. Patient not taking: Reported on 07/18/2023 03/13/23   [provider]  valACYclovir  (VALTREX ) 500 MG tablet Take 1 tablet (500 mg total) by mouth daily. 07/18/23   Jolayne Natter, CNM    Family History Family History  Problem Relation Age of Onset   Asthma Father    Depression Father    Stroke Father    Asthma Mother    Diabetes Mother    Hypertension Mother    Miscarriages / [place] Mother    Stroke Mother    Bipolar disorder Mother    Diabetes Brother    Diabetes Sister    Mental illness Sister    Miscarriages / [place] Sister    Bipolar disorder Sister    Asthma Daughter    Diabetes Maternal Aunt     Social History Social History   Tobacco Use   Smoking status: Former    Types: Cigarettes   Smokeless tobacco: Never   Tobacco comments:    Pt reports smoking only some days, but has stopped since finding out she was pregnant.   Vaping Use   Vaping status: Never Used  Substance Use Topics   Alcohol use: Not Currently    Comment: Last drink in February 2024.  1 person a bottle of gray goose vodka at a time   Drug use: Not Currently    Types: Marijuana    Comment: Couple hits of a joint last use in early October 2024     Allergies   Patient has no known allergies.   Review of Systems Review of Systems PER HPI  Physical Exam Triage Vital Signs ED Triage Vitals [11/22/23 1658]  Encounter Vitals Group     BP (!) 133/90     Girls Systolic BP Percentile      Girls Diastolic BP Percentile      Boys Systolic BP Percentile      Boys  Diastolic BP Percentile      Pulse Rate 90     Resp 20     Temp 98.2 F (36.8 C)     Temp Source Oral     SpO2 98 %     Weight      Height      Head Circumference      Peak Flow  Pain Score 10     Pain Loc      Pain Education      Exclude from Growth Chart    No data found.  Updated Vital Signs BP (!) 133/90 (BP Location: Right Arm)   Pulse 90   Temp 98.2 F (36.8 C) (Oral)   Resp 20   LMP 11/11/2023 (Approximate)   SpO2 98%   Breastfeeding No   Visual Acuity Right Eye Distance:   Left Eye Distance:   Bilateral Distance:    Right Eye Near:   Left Eye Near:    Bilateral Near:     Physical Exam Vitals and nursing note reviewed.  Constitutional:      Appearance: Normal appearance. She is not ill-appearing.  HENT:     Head: Atraumatic.   Eyes:     Extraocular Movements: Extraocular movements intact.     Conjunctiva/sclera: Conjunctivae normal.    Cardiovascular:     Rate and Rhythm: Normal rate.  Pulmonary:     Effort: Pulmonary effort is normal.   Musculoskeletal:        General: Normal range of motion.     Cervical back: Normal range of motion and neck supple.   Skin:    General: Skin is warm and dry.     Comments: Paronychia present to left great toenail edge   Neurological:     Mental Status: She is alert and oriented to person, place, and time.     Comments: Left lower extremity neurovascularly intact  Psychiatric:        Mood and Affect: Mood normal.        Thought Content: Thought content normal.        Judgment: Judgment normal.      UC Treatments / Results  Labs (all labs ordered are listed, but only abnormal results are displayed) Labs Reviewed - No data to display  EKG   Radiology No results found.  Procedures Procedures (including critical care time)  Medications Ordered in UC Medications - No data to display  Initial Impression / Assessment and Plan / UC Course  I have reviewed the triage vital signs and the  nursing notes.  Pertinent labs & imaging results that were available during my care of the patient were reviewed by me and considered in my medical decision making (see chart for details).     Treat with Keflex , Hibiclens, Bactroban, Epsom salt soaks, dressings.  Return for worsening symptoms.  Work note given.  Final Clinical Impressions(s) / UC Diagnoses   Final diagnoses:  Paronychia of great toe   Discharge Instructions   None    ED Prescriptions     Medication Sig Dispense Auth. Provider   cephALEXin  (KEFLEX ) 500 MG capsule Take 1 capsule (500 mg total) by mouth 2 (two) times daily. 14 capsule Corbin Dess, PA-C   chlorhexidine (HIBICLENS) 4 % external liquid Apply topically daily as needed. 236 mL Corbin Dess, PA-C   mupirocin ointment (BACTROBAN) 2 % Apply 1 Application topically 2 (two) times daily. 22 g Corbin Dess, [place]      PDMP not reviewed this encounter.   Corbin Dess, [place] 11/22/23 1755

## 2023-11-22 NOTE — ED Triage Notes (Signed)
 Pt reports left great toe pain. Pt reports pulled a piece of skin off and reports pain and drainage from toe x1 week.

## 2023-11-23 LAB — URINE CULTURE

## 2023-11-26 ENCOUNTER — Encounter (HOSPITAL_COMMUNITY): Payer: Self-pay | Admitting: Registered Nurse

## 2023-11-26 ENCOUNTER — Ambulatory Visit: Payer: Self-pay | Admitting: Women's Health

## 2023-11-26 ENCOUNTER — Telehealth (INDEPENDENT_AMBULATORY_CARE_PROVIDER_SITE_OTHER): Admitting: Registered Nurse

## 2023-11-26 DIAGNOSIS — F909 Attention-deficit hyperactivity disorder, unspecified type: Secondary | ICD-10-CM

## 2023-11-26 DIAGNOSIS — F411 Generalized anxiety disorder: Secondary | ICD-10-CM

## 2023-11-26 DIAGNOSIS — F330 Major depressive disorder, recurrent, mild: Secondary | ICD-10-CM

## 2023-11-26 DIAGNOSIS — Z79899 Other long term (current) drug therapy: Secondary | ICD-10-CM | POA: Diagnosis not present

## 2023-11-26 MED ORDER — AMPHETAMINE-DEXTROAMPHET ER 20 MG PO CP24: 20.0000 mg | ORAL_CAPSULE | Freq: Every day | ORAL | 0 refills | Status: AC

## 2023-11-26 MED ORDER — BUPROPION HCL ER (XL) 150 MG PO TB24: 150.0000 mg | ORAL_TABLET | Freq: Every day | ORAL | 1 refills | Status: AC

## 2023-11-26 MED ORDER — HYDROXYZINE HCL 10 MG PO TABS: ORAL_TABLET | ORAL | 1 refills | Status: AC

## 2023-11-26 NOTE — Patient Instructions (Signed)

## 2023-11-26 NOTE — Progress Notes (Signed)
 BH MD/PA/NP OP Progress Note  11/26/2023 9:22 AM JERZY CROTTEAU  MRN:  [id number]  Virtual Visit via Video Note  I connected with Donna Conrad on 11/26/23 at  8:00 AM EDT by a video enabled telemedicine application and verified that I am speaking with the correct person using two identifiers.  Location: Patient: Home Provider: [facility] [place]   I discussed the limitations of evaluation and management by telemedicine and the availability of in person appointments. The patient expressed understanding and agreed to proceed.  I discussed the assessment and treatment plan with the patient. The patient was provided an opportunity to ask questions and all were answered. The patient agreed with the plan and demonstrated an understanding of the instructions.   The patient was advised to call back or seek an in-person evaluation if the symptoms worsen or if the condition fails to improve as anticipated.  I provided 40 minutes of non-face-to-face time during this encounter.   Luisa Ruder, NP   Chief Complaint:  Chief Complaint  Patient presents with   Follow-up    Medication management   HPI: Donna Conrad 28 y.o. female presents today for medication management follow up.  She is seen via virtual video visit by this provider, and chart reviewed on 11/26/23.  Her psychiatric history is significant for depression, anxiety, and ADHD.  Her mental health is currently managed with Wellbutrin  XL 150 mg daily.  She reports she felt that the Wellbutrin  did help with depression and reports I felt like it did help.  I did calm down and was feeling better.  She states that she has been off of the Wellbutrin  for 2 months related to not following up.  I thought they would call me to schedule next appointment but I guess I was supposed to call and when I thought about it I did call into schedule.  She also reports that there has been some increased stressors related to returning back to  school.  I just moved.  Well, lost my job after moving and I was stressed out about how I was going to pay to bills.  But, I am working again but is still stressful.  I hate to ask people to help me with my daughters because I feel like they are always thinking he should have had kids and I feel like they are judging me.  She states she do have 2 people in life that are her primary support system.  She reports having trouble focusing during school.  Not currently taking anything for ADHD but took Adderall during childhood.  She denies suicidal/self-harm/homicidal ideations, psychosis, paranoia, and abnormal movements. PHQ 2/9, C-SSRS, GAD 7, AUDIT, AIMS, nutrition, and pain screenings conducted during today's visit, see scores below. Recommended the following: Restart Wellbutrin  150 mg daily, start Adderall XR 20 mg daily and Vistaril  10-20 mg 3 times daily as needed anxiety.  She is Informed of side effect/efficacy profile on Adderall and Vistaril .  She voices understanding/ agreement with information/recommendations being given to her today.    Visit Diagnosis:    ICD-10-CM   1. On psychotropic medication  Z79.899 Drugs of abuse screen w/o alc (for BH OP)    Lipid panel    Prolactin    HgB A1c    TSH    2. Major depressive disorder, recurrent, mild (HCC)  F33.0 buPROPion  (WELLBUTRIN  XL) 150 MG 24 hr tablet    3. Attention deficit hyperactivity disorder (ADHD), unspecified ADHD type  F90.9  buPROPion  (WELLBUTRIN  XL) 150 MG 24 hr tablet    4. GAD (generalized anxiety disorder)  F41.1 hydrOXYzine  (ATARAX ) 10 MG tablet    Lipid panel    HgB A1c    TSH      Past Psychiatric History: See below in her medical history  Past Medical History:  Past Medical History:  Diagnosis Date   ADHD (attention deficit hyperactivity disorder)    Anxiety    Depression    HSV infection    Pre-eclampsia     Past Surgical History:  Procedure Laterality Date   CESAREAN SECTION N/A 11/19/2014   Procedure:  CESAREAN SECTION;  Surgeon: Ezzie Buba, MD;  Location: [facility];  Service: Obstetrics;  Laterality: N/A;   CESAREAN SECTION N/A 05/25/2023   Procedure: CESAREAN SECTION;  Surgeon: Erik Kieth BROCKS, MD;  Location: [facility];  Service: Obstetrics;  Laterality: N/A;   INTRAUTERINE DEVICE (IUD) INSERTION N/A 05/25/2023   Procedure: POSSIBLE INTRAUTERINE DEVICE (IUD) INSERTION;  Surgeon: Erik Kieth BROCKS, MD;  Location: [facility];  Service: Obstetrics;  Laterality: N/A;   WISDOM TOOTH EXTRACTION      Family Psychiatric History: See below and family history  Family History:  Family History  Problem Relation Age of Onset   Asthma Father    Depression Father    Stroke Father    Asthma Mother    Diabetes Mother    Hypertension Mother    Miscarriages / [place] Mother    Stroke Mother    Bipolar disorder Mother    Diabetes Brother    Diabetes Sister    Mental illness Sister    Miscarriages / [place] Sister    Bipolar disorder Sister    Asthma Daughter    Diabetes Maternal Aunt     Social History:  Social History   Socioeconomic History   Marital status: Single    Spouse name: Not on file   Number of children: 2   Years of education: 10   Highest education level: 10th grade  Occupational History   Occupation: Engineer, agricultural at [employer]  Tobacco Use   Smoking status: Former    Types: Cigarettes   Smokeless tobacco: Never   Tobacco comments:    Pt reports smoking only some days, but has stopped since finding out she was pregnant.   Vaping Use   Vaping status: Never Used  Substance and Sexual Activity   Alcohol use: Not Currently    Comment: Last drink in February 2024.  1 person a bottle of gray goose vodka at a time   Drug use: Not Currently    Types: Marijuana    Comment: Couple hits of a joint last use in early October 2024   Sexual activity: Not Currently    Birth control/protection: None  Other Topics Concern   Not on file   Social History Narrative   Not on file   Social Drivers of Health   Financial Resource Strain: High Risk (12/01/2022)   Overall Financial Resource Strain (CARDIA)    Difficulty of Paying Living Expenses: Hard  Food Insecurity: No Food Insecurity (05/25/2023)   Hunger Vital Sign    Worried About Running Out of Food in the Last Year: Never true    Ran Out of Food in the Last Year: Never true  Transportation Needs: No Transportation Needs (05/25/2023)   PRAPARE - Administrator, Civil Service (Medical): No    Lack of Transportation (Non-Medical): No  Physical Activity:  Insufficiently Active (12/01/2022)   Exercise Vital Sign    Days of Exercise per Week: 2 days    Minutes of Exercise per Session: 20 min  Stress: Stress Concern Present (12/01/2022)   [employer] of Occupational Health - Occupational Stress Questionnaire    Feeling of Stress : Very much  Social Connections: Socially Isolated (12/01/2022)   Social Connection and Isolation Panel    Frequency of Communication with Friends and Family: Three times a week    Frequency of Social Gatherings with Friends and Family: Three times a week    Attends Religious Services: Never    Active Member of Clubs or Organizations: No    Attends Banker Meetings: Never    Marital Status: Never married    Allergies: No Known Allergies  Metabolic Disorder Labs: Labs not done from orders on prior visit.  We ordered labs today Lab Orders         Drugs of abuse screen w/o alc (for BH OP)         Lipid panel         Prolactin         HgB A1c         TSH     Lab Results  Component Value Date   HGBA1C 5.7 (H) 12/01/2022    Current Medications: Current Outpatient Medications  Medication Sig Dispense Refill   amphetamine-dextroamphetamine (ADDERALL XR) 20 MG 24 hr capsule Take 1 capsule (20 mg total) by mouth daily. 30 capsule 0   acetaminophen  (TYLENOL ) 500 MG tablet Take 1,000 mg by mouth every 6 (six) hours as  needed for moderate pain (pain score 4-6).     Blood Pressure Monitor MISC For regular home bp monitoring during pregnancy (Patient not taking: Reported on 07/18/2023) 1 each 0   buPROPion  (WELLBUTRIN  XL) 150 MG 24 hr tablet Take 1 tablet (150 mg total) by mouth daily. 30 tablet 1   cephALEXin  (KEFLEX ) 500 MG capsule Take 1 capsule (500 mg total) by mouth 2 (two) times daily. 14 capsule 0   chlorhexidine  (HIBICLENS ) 4 % external liquid Apply topically daily as needed. 236 mL 0   chlorhexidine  (PERIDEX ) 0.12 % solution Use as directed 5 mLs in the mouth or throat 2 (two) times daily.     fluticasone  (FLONASE ) 50 MCG/ACT nasal spray Place 2 sprays into both nostrils daily. 16 g 2   fluticasone  (FLONASE ) 50 MCG/ACT nasal spray Place 1 spray into both nostrils 2 (two) times daily. (Patient not taking: Reported on 07/18/2023) 16 g 2   hydrOXYzine  (ATARAX ) 10 MG tablet Take 1-2 tablets (10 mg to 20 mg) three times daily as needed for anxiety 60 tablet 1   ibuprofen  (ADVIL ) 600 MG tablet Take 1 tablet (600 mg total) by mouth every 6 (six) hours. 30 tablet 0   iron  polysaccharides (NIFEREX) 150 MG capsule Take 1 capsule (150 mg total) by mouth every other day. (Patient not taking: Reported on 07/18/2023) 15 capsule 0   medroxyPROGESTERone  (DEPO-PROVERA ) 150 MG/ML injection Inject 1 mL (150 mg total) into the muscle every 3 (three) months. 1 mL 3   mupirocin  ointment (BACTROBAN ) 2 % Apply 1 Application topically 2 (two) times daily. 22 g 0   NIFEdipine  (PROCARDIA  XL/NIFEDICAL XL) 60 MG 24 hr tablet Take 1 tablet (60 mg total) by mouth daily. 30 tablet 2   omeprazole  (PRILOSEC) 20 MG capsule TAKE 1 CAPSULE BY MOUTH DAILY 90 capsule 2   SODIUM FLUORIDE 5000 PPM 1.1 %  PSTE Take 1 Application by mouth daily. (Patient not taking: Reported on 07/18/2023)     valACYclovir  (VALTREX ) 500 MG tablet Take 1 tablet (500 mg total) by mouth daily. 34 tablet 4   Current Facility-Administered Medications  Medication Dose Route  Frequency Provider Last Rate Last Admin   albuterol  (PROVENTIL ) (2.5 MG/3ML) 0.083% nebulizer solution 2.5 mg  2.5 mg Nebulization Once PRN Cresenzo-Dishmon, Cathlean, CNM       EPINEPHrine  (ADRENALIN ) 0.3 mg  0.3 mg Intramuscular Once PRN Cresenzo-Dishmon, Cathlean, CNM       methylPREDNISolone sodium succinate (SOLU-MEDROL) 125 mg in sodium chloride  0.9 % 50 mL IVPB  125 mg Intravenous Once PRN Cresenzo-Dishmon, Cathlean, CNM       sodium chloride  0.9 % bolus 500 mL  500 mL Intravenous Once PRN Cresenzo-Dishmon, Cathlean, CNM       sodium chloride  flush (NS) 0.9 % injection 3 mL  3 mL Intravenous PRN Newton Cathlean, CNM         Musculoskeletal: Strength & Muscle Tone: Unable to assess via virtual visit Gait & Station: Unable to assess via virtual visit Patient leans: N/A  Psychiatric Specialty Exam: Review of Systems  Constitutional:        No other complaints voiced at this time  Musculoskeletal:  Positive for back pain.  Psychiatric/Behavioral:  Positive for dysphoric mood and sleep disturbance. Negative for hallucinations, self-injury and suicidal ideas. The patient is nervous/anxious. The patient is not hyperactive.   All other systems reviewed and are negative.   Last menstrual period 11/11/2023, not currently breastfeeding.There is no height or weight on file to calculate BMI.  General Appearance: Casual  Eye Contact:  Good  Speech:  Clear and Coherent and Normal Rate  Volume:  Normal  Mood:  Dysphoric  Affect:  Congruent  Thought Process:  Coherent, Goal Directed, and Descriptions of Associations: Intact  Orientation:  Full (Time, Place, and Person)  Thought Content: Logical   Suicidal Thoughts:  No  Homicidal Thoughts:  No  Memory:  Immediate;   Good Recent;   Good Remote;   Good  Judgement:  Intact  Insight:  Present  Psychomotor Activity:  Normal  Concentration:  Concentration: Good and Attention Span: Good  Recall:  Good  Fund of Knowledge: Good   Language: Good  Akathisia:  No  Handed:  Right  AIMS (if indicated): done  Assets:  Communication Skills Desire for Improvement Financial Resources/Insurance Housing Physical Health Social Support Transportation  ADL's:  Intact  Cognition: WNL  Sleep:  Fair   Screenings: AIMS    Flowsheet Row Video Visit from 11/26/2023 in [facility] [place] Office Visit from 08/30/2023 in [facility] [place]  AIMS Total Score 0 0   GAD-7    Flowsheet Row Video Visit from 11/26/2023 in [facility] [place] Office Visit from 08/30/2023 in [facility] at [facility] from 06/19/2023 in [facility] for [employer] at [facility] Integrated Behavioral Health from 02/19/2023 in Center for [facility] at [facility] Integrated [facility]al Health from 02/01/2023 in Center for [facility] at [facility]  Total GAD-7 Score 14 14 8 16 18    PHQ2-9    Flowsheet Row Video Visit from 11/26/2023 in [facility] at [place] Office Visit from 08/30/2023 in [facility] Outpatient Behavior[facility] Health at [facility] from 06/19/2023 in Center  for [employer] at [facility] Clinical Support from 04/24/2023 in [facility] at [facility] Clinical Support from 04/10/2023 in [facility] at [facility] Total Score 1 2 0 2 2  PHQ-9 Total Score -- 7 7 10 9    Flowsheet Row Video Visit from 11/26/2023 in [facility] [place] [facility] from 11/22/2023 in [facility] at [facility] Visit from 08/30/2023 in [facility] at [place]  C-SSRS RISK CATEGORY No Risk No Risk No Risk     Assessment and Plan:  Assessment: Patient seen and examined as noted above. Summary: Today Donna Conrad appears to be doing fairly well.  Reports Wellbutrin  did help with depression/anxiety symptoms but not so much with ADHD.  Reports difficulty with focus/concentration in school and doing homework.  Reports she has been off of the Wellbutrin  for 2 months related to no follow-up for medication management.  She denies suicidal/self-harm/homicidal ideation, psychosis, paranoia, and abnormal movements. During visit she is dressed appropriate for age and weather.  She is seated comfortably in view of camera with no noted distress.  She is alert/oriented x 4, calm/cooperative and mood is congruent with affect.  She spoke in a clear tone at moderate volume, and normal pace, with good eye contact.  Her thought process is coherent, relevant, and there is no indication that she is currently responding to internal/external stimuli or experiencing delusional thought content.    1. Major depressive disorder, recurrent, mild (HCC) - buPROPion  (WELLBUTRIN  XL) 150 MG 24 hr tablet; Take 1 tablet (150 mg total) by mouth daily.  Dispense: 30 tablet; Refill: 1  2. Attention deficit hyperactivity disorder (ADHD), unspecified ADHD type - buPROPion  (WELLBUTRIN  XL) 150 MG 24 hr tablet; Take 1 tablet (150 mg total) by mouth daily.  Dispense: 30 tablet; Refill: 1  3. GAD (generalized anxiety disorder) - hydrOXYzine  (ATARAX ) 10 MG tablet; Take 1-2 tablets (10 mg to 20 mg) three times daily as needed for anxiety  Dispense: 60 tablet; Refill: 1 - Lipid panel - HgB A1c - TSH  4. On psychotropic medication (Primary) - Drugs of abuse screen w/o alc (for BH OP) - Lipid panel - Prolactin - HgB A1c - TSH    Plan: Medications: Meds ordered this encounter  Medications   buPROPion  (WELLBUTRIN  XL) 150 MG 24 hr tablet    Sig: Take 1 tablet (150 mg total) by mouth daily.    Dispense:  30 tablet    Refill:  1    Supervising Provider:    CURRY, SYED T [[id number]]   hydrOXYzine  (ATARAX ) 10 MG tablet    Sig: Take 1-2 tablets (10 mg to 20 mg) three times daily as needed for anxiety    Dispense:  60 tablet    Refill:  1    Supervising Provider:   CURRY PATERSON T [[id number]]   amphetamine-dextroamphetamine (ADDERALL XR) 20 MG 24 hr capsule    Sig: Take 1 capsule (20 mg total) by mouth daily.    Dispense:  30 capsule    Refill:  0    Supervising Provider:   CURRY PATERSON T [[id number]]    Lab Orders         Drugs of abuse screen w/o alc (for BH OP)         Lipid panel         Prolactin         HgB A1c  TSH     Other:  Referred to  counseling/therapy.   Donna Conrad is instructed to call [phone], mobile crisis, or present to the nearest emergency room should she experience any suicidal/homicidal ideation, auditory/visual/hallucinations, or detrimental worsening of her mental health condition.   Donna Conrad participated in the development of this treatment plan and verbalized her understanding/agreement with plan as listed.  Follow Up: Return in 1 month Call in the interim for any side-effects, decompensation, questions, or problems  Collaboration of Care: Collaboration of Care: Medication Management AEB patient assessment, adjustment and refill, Referral or follow-up with counselor/therapist AEB referral counseling/therapy, and Other labs reordered  Patient/Guardian was advised Release of Information must be obtained prior to any record release in order to collaborate their care with an outside provider. Patient/Guardian was advised if they have not already done so to contact the registration department to sign all necessary forms in order for us  to release information regarding their care.   Consent: Patient/Guardian gives verbal consent for treatment and assignment of benefits for services provided during this visit. Patient/Guardian expressed understanding and agreed to proceed.    Donna Lanphere, NP 11/26/2023, 9:22  AM

## 2023-12-12 ENCOUNTER — Other Ambulatory Visit (HOSPITAL_COMMUNITY): Payer: Self-pay | Admitting: Neurosurgery

## 2023-12-12 DIAGNOSIS — S39012A Strain of muscle, fascia and tendon of lower back, initial encounter: Secondary | ICD-10-CM

## 2023-12-20 ENCOUNTER — Ambulatory Visit (HOSPITAL_COMMUNITY)
Admission: RE | Admit: 2023-12-20 | Discharge: 2023-12-20 | Disposition: A | Discharge status: Home / Self Care | Source: Ambulatory Visit | Attending: Neurosurgery | Admitting: Neurosurgery

## 2023-12-20 DIAGNOSIS — S39012A Strain of muscle, fascia and tendon of lower back, initial encounter: Secondary | ICD-10-CM | POA: Insufficient documentation

## 2024-01-11 ENCOUNTER — Other Ambulatory Visit: Payer: Self-pay | Admitting: Advanced Practice Midwife

## 2024-01-11 DIAGNOSIS — O10919 Unspecified pre-existing hypertension complicating pregnancy, unspecified trimester: Secondary | ICD-10-CM

## 2024-01-11 DIAGNOSIS — O0991 Supervision of high risk pregnancy, unspecified, first trimester: Secondary | ICD-10-CM

## 2024-01-26 ENCOUNTER — Other Ambulatory Visit: Payer: Self-pay | Admitting: Advanced Practice Midwife

## 2024-01-31 ENCOUNTER — Other Ambulatory Visit: Payer: Self-pay | Admitting: Advanced Practice Midwife

## 2024-01-31 MED ORDER — MEDROXYPROGESTERONE ACETATE 150 MG/ML IM SUSP
150.0000 mg | INTRAMUSCULAR | 3 refills | Status: AC
Start: 2024-01-31 — End: ?

## 2024-02-19 ENCOUNTER — Ambulatory Visit
